# Patient Record
Sex: Male | Born: 1965 | Race: Black or African American | Hispanic: No | Smoking: Current some day smoker
Health system: Southern US, Community
[De-identification: ages and names within clinical notes are randomized; demographics above are authoritative.]

## PROBLEM LIST (undated history)

## (undated) DIAGNOSIS — Z955 Presence of coronary angioplasty implant and graft: Secondary | ICD-10-CM

## (undated) DIAGNOSIS — I739 Peripheral vascular disease, unspecified: Secondary | ICD-10-CM

## (undated) DIAGNOSIS — Z86718 Personal history of other venous thrombosis and embolism: Secondary | ICD-10-CM

## (undated) DIAGNOSIS — I82409 Acute embolism and thrombosis of unspecified deep veins of unspecified lower extremity: Secondary | ICD-10-CM

## (undated) DIAGNOSIS — E119 Type 2 diabetes mellitus without complications: Secondary | ICD-10-CM

## (undated) DIAGNOSIS — I10 Essential (primary) hypertension: Secondary | ICD-10-CM

## (undated) DIAGNOSIS — I2510 Atherosclerotic heart disease of native coronary artery without angina pectoris: Secondary | ICD-10-CM

## (undated) DIAGNOSIS — I255 Ischemic cardiomyopathy: Secondary | ICD-10-CM

## (undated) DIAGNOSIS — I219 Acute myocardial infarction, unspecified: Secondary | ICD-10-CM

## (undated) DIAGNOSIS — I420 Dilated cardiomyopathy: Secondary | ICD-10-CM

## (undated) DIAGNOSIS — I252 Old myocardial infarction: Secondary | ICD-10-CM

## (undated) DIAGNOSIS — E669 Obesity, unspecified: Secondary | ICD-10-CM

## (undated) DIAGNOSIS — E7800 Pure hypercholesterolemia, unspecified: Secondary | ICD-10-CM

## (undated) DIAGNOSIS — N529 Male erectile dysfunction, unspecified: Secondary | ICD-10-CM

## (undated) HISTORY — DX: Personal history of other venous thrombosis and embolism: Z86.718

## (undated) HISTORY — DX: Type 2 diabetes mellitus without complications: E11.9

## (undated) HISTORY — DX: Male erectile dysfunction, unspecified: N52.9

## (undated) HISTORY — DX: Presence of coronary angioplasty implant and graft: Z95.5

## (undated) HISTORY — DX: Essential (primary) hypertension: I10

## (undated) HISTORY — DX: Old myocardial infarction: I25.2

## (undated) HISTORY — DX: Dilated cardiomyopathy: I42.0

## (undated) HISTORY — DX: Pure hypercholesterolemia, unspecified: E78.00

## (undated) HISTORY — DX: Obesity, unspecified: E66.9

## (undated) HISTORY — DX: Ischemic cardiomyopathy: I25.5

## (undated) HISTORY — PX: CORONARY STENT PLACEMENT: SHX1402

---

## 1994-09-19 DIAGNOSIS — E119 Type 2 diabetes mellitus without complications: Secondary | ICD-10-CM

## 1994-09-19 HISTORY — DX: Type 2 diabetes mellitus without complications: E11.9

## 2008-12-24 ENCOUNTER — Emergency Department (HOSPITAL_BASED_OUTPATIENT_CLINIC_OR_DEPARTMENT_OTHER): Admission: EM | Admit: 2008-12-24 | Discharge: 2008-12-24 | Payer: Self-pay | Admitting: Emergency Medicine

## 2008-12-24 ENCOUNTER — Ambulatory Visit: Payer: Self-pay | Admitting: Radiology

## 2011-08-29 ENCOUNTER — Ambulatory Visit (INDEPENDENT_AMBULATORY_CARE_PROVIDER_SITE_OTHER): Payer: 59 | Admitting: Physician Assistant

## 2011-08-29 DIAGNOSIS — N529 Male erectile dysfunction, unspecified: Secondary | ICD-10-CM

## 2011-11-08 ENCOUNTER — Other Ambulatory Visit: Payer: Self-pay | Admitting: Physician Assistant

## 2011-12-26 ENCOUNTER — Ambulatory Visit (INDEPENDENT_AMBULATORY_CARE_PROVIDER_SITE_OTHER): Payer: 59 | Admitting: Physician Assistant

## 2011-12-26 ENCOUNTER — Encounter: Payer: Self-pay | Admitting: Physician Assistant

## 2011-12-26 VITALS — BP 140/90 | HR 89 | Temp 99.9°F | Resp 16 | Ht 68.75 in | Wt 176.8 lb

## 2011-12-26 DIAGNOSIS — E119 Type 2 diabetes mellitus without complications: Secondary | ICD-10-CM | POA: Insufficient documentation

## 2011-12-26 DIAGNOSIS — R5383 Other fatigue: Secondary | ICD-10-CM

## 2011-12-26 DIAGNOSIS — N50819 Testicular pain, unspecified: Secondary | ICD-10-CM

## 2011-12-26 DIAGNOSIS — N509 Disorder of male genital organs, unspecified: Secondary | ICD-10-CM

## 2011-12-26 DIAGNOSIS — I10 Essential (primary) hypertension: Secondary | ICD-10-CM

## 2011-12-26 DIAGNOSIS — R5381 Other malaise: Secondary | ICD-10-CM

## 2011-12-26 LAB — GLUCOSE, POCT (MANUAL RESULT ENTRY): POC Glucose: 261

## 2011-12-26 LAB — POCT GLYCOSYLATED HEMOGLOBIN (HGB A1C): Hemoglobin A1C: 10.6

## 2011-12-26 MED ORDER — LISINOPRIL 5 MG PO TABS: 5.0000 mg | ORAL_TABLET | Freq: Every day | ORAL | Status: DC

## 2011-12-26 MED ORDER — INSULIN GLARGINE 100 UNIT/ML ~~LOC~~ SOLN: SUBCUTANEOUS | Status: DC

## 2011-12-26 MED ORDER — METFORMIN HCL 1000 MG PO TABS: 1000.0000 mg | ORAL_TABLET | Freq: Two times a day (BID) | ORAL | Status: DC

## 2011-12-26 NOTE — Progress Notes (Signed)
Patient ID: Tommy Jimenez MRN: [id number], DOB: [birth year], 46 y.o. Date of Encounter: 12/26/2011, 8:43 PM  Primary Physician: No primary provider on file.  Chief Complaint: Follow up HTN and DM  HPI: 46 y.o. year old male with history below presents for follow up of his DM and HTN. He is doing well today. He continues to not take his Lisinopril stating that he does not believe that he needs this medication. He understands that it will protect his kidneys. He is taking his metformin 1000 mg bid and is currently using Lantus 20 units qhs. At his last office visit he was instructed to increaae his Lantus 2 units every other day from 18 units qhs until he reached an average fasting blood sugar of 150, however, he did not do this only increased by 2 units over the past 4 months. He is trying to eat healthy and exercise. His blood sugars at home he states have been in the upper 100's to mid 200's with a max of 280. He is skeptical that his sugar being that high are making him tired.   He also mentions at the end of his visit today, a couple week history of an off and on sore left testicle. No injury or trauma. He has not felt a lump or mass. No dysuria, discharge, rash, or lesion. He is monogamous with his girlfriend. He wears boxer shorts.      Past Medical History  Diagnosis Date  . DM (diabetes mellitus)   . HTN (hypertension)      Home Meds: Prior to Admission medications   Medication Sig Start Date End Date Taking? Authorizing Provider  insulin glargine (LANTUS SOLOSTAR) 100 UNIT/ML injection 22 units [place] qhs, increase by 2 units every two days until you reach a fasting blood sugar of 150 12/26/11   Rolene Andrades M Deamber Buckhalter, PA-C  lisinopril (PRINIVIL,ZESTRIL) 5 MG tablet Take 1 tablet (5 mg total) by mouth daily. 12/26/11 12/25/12  Raymon Mutton Abreanna Drawdy, PA-C  metFORMIN (GLUCOPHAGE) 1000 MG tablet Take 1 tablet (1,000 mg total) by mouth 2 (two) times daily with a meal. 12/26/11 12/25/12  Sondra Barges, PA-C    Allergies:  No Known Allergies  History   Social History  . Marital Status: Single    Spouse Name: N/A    Number of Children: N/A  . Years of Education: N/A   Occupational History  . Not on file.   Social History Main Topics  . Smoking status: Current Some Day Smoker  . Smokeless tobacco: Not on file  . Alcohol Use: Yes     socially  . Drug Use: No  . Sexually Active: Not on file   Other Topics Concern  . Not on file   Social History Narrative  . No narrative on file     Review of Systems: Constitutional: negative for chills, fever, night sweats, weight changes, or fatigue  HEENT: negative for vision changes, hearing loss, congestion, rhinorrhea, ST, epistaxis, or sinus pressure Cardiovascular: negative for chest pain or palpitations Respiratory: negative for hemoptysis, wheezing, shortness of breath, or cough Abdominal: negative for abdominal pain, nausea, vomiting, diarrhea, or constipation Dermatological: negative for rash Neurologic: negative for headache, dizziness, or syncope All other systems reviewed and are otherwise negative with the exception to those above and in the HPI.   Physical Exam: Blood pressure 140/90, pulse 89, temperature 99.9 F (37.7 C), temperature source Oral, resp. rate 16, height 5' 8.75" (1.746 m), weight 176 lb 12.8 oz (80.196 kg).,  Body mass index is 26.30 kg/(m^2). General: Well developed, well nourished, in no acute distress. Head: Normocephalic, atraumatic, eyes without discharge, sclera non-icteric, nares are without discharge. Bilateral auditory canals clear, TM's are without perforation, pearly grey and translucent with reflective cone of light bilaterally. Oral cavity moist, posterior pharynx without exudate, erythema, peritonsillar abscess, or post nasal drip.  Neck: Supple. No thyromegaly. Full ROM. No lymphadenopathy. Lungs: Clear bilaterally to auscultation without wheezes, rales, or rhonchi. Breathing is unlabored. Heart: RRR with S1 S2.  No murmurs, rubs, or gallops appreciated. Abdomen: Soft, non-tender, non-distended with normoactive bowel sounds. No hepatomegaly. No rebound/guarding. No obvious abdominal masses. GU: Circumcised penis. Testes smooth, without mass, lumps, lesions, and descended bilaterally. No hernia. No erythema, discharge, or rash.  Msk:  Strength and tone normal for age. Extremities/Skin: Warm and dry. No clubbing or cyanosis. No edema. No rashes or suspicious lesions. Neuro: Alert and oriented X 3. Moves all extremities spontaneously. Gait is normal. CNII-XII grossly in tact. Psych:  Responds to questions appropriately with a normal affect.   Labs: Results for orders placed in visit on 12/26/11  GLUCOSE, POCT (MANUAL RESULT ENTRY)      Component Value Range   POC Glucose 261    POCT GLYCOSYLATED HEMOGLOBIN (HGB A1C)      Component Value Range   Hemoglobin A1C 10.6     A1C from 12/012: 10.4% CMP pending  ASSESSMENT AND PLAN:  46 y.o. year old male with uncontrolled HTN, DM, and testicular pain. 1. DM: -Uncontrolled -Continue metformin 1000 mg #60 1 po bid RF 3 -Increase Lantus by 2 units qod until average fasting glucose at home is 150, he is starting at 20 units -Healthy diet and exercise  2. HTN -Uncontrolled -Take Lisinopril 5 mg #30 1 po daily RF 3. This is the 3rd rx I have written for him for this medication -Take BP at a local store -Healthy diet and exercise -Stop tobacoo use  3. Testicular pain -Likely due to lack of support -Stop boxers -Start briefs -If continues in 1 week call with update  4. Follow up 2 months   Signed, Eula Listen, PA-C 12/26/2011 8:43 PM

## 2011-12-27 LAB — COMPREHENSIVE METABOLIC PANEL
AST: 12 U/L (ref 0–37)
Albumin: 4.4 g/dL (ref 3.5–5.2)
Alkaline Phosphatase: 82 U/L (ref 39–117)
Potassium: 4.3 mEq/L (ref 3.5–5.3)
Sodium: 136 mEq/L (ref 135–145)
Total Bilirubin: 0.4 mg/dL (ref 0.3–1.2)
Total Protein: 7.3 g/dL (ref 6.0–8.3)

## 2011-12-27 LAB — CBC WITH DIFFERENTIAL/PLATELET
Basophils Relative: 1 % (ref 0–1)
Eosinophils Absolute: 0.2 10*3/uL (ref 0.0–0.7)
Eosinophils Relative: 2 % (ref 0–5)
MCH: 28.4 pg (ref 26.0–34.0)
MCHC: 33.7 g/dL (ref 30.0–36.0)
MCV: 84.4 fL (ref 78.0–100.0)
Monocytes Relative: 7 % (ref 3–12)
Neutrophils Relative %: 53 % (ref 43–77)
Platelets: 352 10*3/uL (ref 150–400)

## 2011-12-28 ENCOUNTER — Encounter: Payer: Self-pay | Admitting: *Deleted

## 2012-03-05 ENCOUNTER — Other Ambulatory Visit: Payer: Self-pay | Admitting: *Deleted

## 2012-04-13 ENCOUNTER — Other Ambulatory Visit: Payer: Self-pay | Admitting: Physician Assistant

## 2012-05-07 ENCOUNTER — Encounter: Payer: Self-pay | Admitting: Physician Assistant

## 2012-05-07 ENCOUNTER — Ambulatory Visit (INDEPENDENT_AMBULATORY_CARE_PROVIDER_SITE_OTHER): Payer: 59 | Admitting: Physician Assistant

## 2012-05-07 VITALS — BP 122/80 | HR 93 | Temp 98.6°F | Resp 16 | Ht 68.5 in | Wt 176.8 lb

## 2012-05-07 DIAGNOSIS — N529 Male erectile dysfunction, unspecified: Secondary | ICD-10-CM

## 2012-05-07 DIAGNOSIS — I10 Essential (primary) hypertension: Secondary | ICD-10-CM

## 2012-05-07 DIAGNOSIS — IMO0001 Reserved for inherently not codable concepts without codable children: Secondary | ICD-10-CM

## 2012-05-07 DIAGNOSIS — C8332 Diffuse large B-cell lymphoma, intrathoracic lymph nodes: Secondary | ICD-10-CM

## 2012-05-07 DIAGNOSIS — E119 Type 2 diabetes mellitus without complications: Secondary | ICD-10-CM

## 2012-05-07 LAB — GLUCOSE, POCT (MANUAL RESULT ENTRY): POC Glucose: 125 mg/dl — AB (ref 70–99)

## 2012-05-07 MED ORDER — SILDENAFIL CITRATE 50 MG PO TABS: 50.0000 mg | ORAL_TABLET | Freq: Every day | ORAL | Status: DC | PRN

## 2012-05-07 MED ORDER — LISINOPRIL 5 MG PO TABS: 5.0000 mg | ORAL_TABLET | Freq: Every day | ORAL | Status: DC

## 2012-05-07 NOTE — Progress Notes (Signed)
Patient ID: Tommy Jimenez MRN: [id number], DOB: 01/16/1966, 46 y.o. Date of Encounter: 05/07/2012, 3:34 PM  Primary Physician: No primary provider on file.  Chief Complaint: HTN  HPI: 46 y.o. year old male with history below presents for hypertension and diabetes follow up. Doing well. No issues or complaints. Stopped checking his blood sugars at home. Believes that his last readings were in the low 200's. Currently taking 30 units of Lantus each night. Has not increased this in quite sometime. Trying to eat healthy. Still with some fatigue. Would like to have his testosterone checked today. Also requests a refill of Viagra. Taking medications daily without issues.   No CP, HA, visual changes, or focal deficits.   Past Medical History  Diagnosis Date  . DM (diabetes mellitus)   . HTN (hypertension)      Home Meds: Prior to Admission medications   Medication Sig Start Date End Date Taking? Authorizing Provider  insulin glargine (LANTUS SOLOSTAR) 100 UNIT/ML injection 22 units [place] qhs, increase by 2 units every two days until you reach a fasting blood sugar of 150 12/26/11   Adream Parzych M Ellanora Rayborn, PA-C  lisinopril (PRINIVIL,ZESTRIL) 5 MG tablet Take 1 tablet (5 mg total) by mouth daily. 12/26/11 12/25/12  Raymon Mutton Leeam Cedrone, PA-C  metFORMIN (GLUCOPHAGE) 1000 MG tablet Take 1 tablet (1,000 mg total) by mouth 2 (two) times daily with a meal. 12/26/11 12/25/12  Sondra Barges, PA-C    Allergies: No Known Allergies  History   Social History  . Marital Status: Single    Spouse Name: N/A    Number of Children: N/A  . Years of Education: N/A   Occupational History  . Not on file.   Social History Main Topics  . Smoking status: Current Some Day Smoker -- 20 years    Types: Cigarettes  . Smokeless tobacco: Not on file  . Alcohol Use: Yes     socially - beer  . Drug Use: No  . Sexually Active: Not on file   Other Topics Concern  . Not on file   Social History Narrative  . No narrative on file     No  family history on file.  Review of Systems: Constitutional: negative for chills, fever, night sweats, weight changes, or fatigue  HEENT: negative for vision changes, hearing loss, congestion, rhinorrhea, ST, epistaxis, or sinus pressure Cardiovascular: negative for chest pain, palpitations, or DOE Respiratory: negative for hemoptysis, wheezing, shortness of breath, or cough Abdominal: negative for abdominal pain, nausea, vomiting, diarrhea, or constipation Dermatological: negative for rash Neurologic: negative for headache, dizziness, or syncope All other systems reviewed and are otherwise negative with the exception to those above and in the HPI.   Physical Exam: Blood pressure 122/80, pulse 93, temperature 98.6 F (37 C), temperature source Oral, resp. rate 16, height 5' 8.5" (1.74 m), weight 176 lb 12.8 oz (80.196 kg), SpO2 99.00%., Body mass index is 26.49 kg/(m^2). General: Well developed, well nourished, in no acute distress. Head: Normocephalic, atraumatic, eyes without discharge, sclera non-icteric, nares are without discharge. Bilateral auditory canals clear, TM's are without perforation, pearly grey and translucent with reflective cone of light bilaterally. Oral cavity moist, posterior pharynx without exudate, erythema, peritonsillar abscess, or post nasal drip.  Neck: Supple. No thyromegaly. Full ROM. No lymphadenopathy. No carotid bruits. Lungs: Clear bilaterally to auscultation without wheezes, rales, or rhonchi. Breathing is unlabored. Heart: RRR with S1 S2. No murmurs, rubs, or gallops appreciated.  Abdomen: Soft, non-tender, non-distended with normoactive bowel  sounds. No hepatosplenomegaly. No rebound/guarding. No obvious abdominal masses. Msk:  Strength and tone normal for age. Extremities/Skin: Warm and dry. No clubbing or cyanosis. No edema. No rashes or suspicious lesions. Distal pulses 2+ and equal bilaterally. Neuro: Alert and oriented X 3. Moves all extremities  spontaneously. Gait is normal. CNII-XII grossly in tact. DTR 2+, cerebellar function intact. [place] normal. Psych:  Responds to questions appropriately with a normal affect.   Labs: Results for orders placed in visit on 05/07/12  GLUCOSE, POCT (MANUAL RESULT ENTRY)      Component Value Range   POC Glucose 125 (*) 70 - 99 mg/dl  POCT GLYCOSYLATED HEMOGLOBIN (HGB A1C)      Component Value Range   Hemoglobin A1C 10.4     A1C on 12/26/11 was 10.6  BMP pending  ASSESSMENT AND PLAN:  46 y.o. year old male with IDDM, HTN, and ED. 1. IDDM -Increase Lantus by 2 units every two days until FBS 130, call when needs refills -Continue Metformin 1000 mg bid, call when needs refills -Continue Lisinopril 5 mg, refilled x 3 -Healthy diet and exercise  2. HTN -Continue Lisinopril 5 mg daily, refilled x 3 -Healthy diet and exercise  3. ED -Check testosterone -Viagra 50 mg As directed #10 RF 8  Signed, Eula Listen, PA-C 05/07/2012 3:34 PM

## 2012-05-08 LAB — BASIC METABOLIC PANEL
BUN: 12 mg/dL (ref 6–23)
Chloride: 107 mEq/L (ref 96–112)
Creat: 0.92 mg/dL (ref 0.50–1.35)
Glucose, Bld: 118 mg/dL — ABNORMAL HIGH (ref 70–99)
Potassium: 4 mEq/L (ref 3.5–5.3)

## 2012-05-09 ENCOUNTER — Telehealth: Payer: Self-pay

## 2012-05-09 NOTE — Telephone Encounter (Signed)
Left message for call back Tommy Jimenez has advised on lab results insurance not likely to cover the Testosterone b/c borderline level, was not low.

## 2012-05-09 NOTE — Telephone Encounter (Signed)
AYESHEA STATES PT GOT THE MEDICINE FOR VIAGRA BUT WAS ALSO TO GET A [facility] MEDICINE ALSO PLEASE CALL [phone]

## 2012-05-12 NOTE — Telephone Encounter (Signed)
Pt notified of labs

## 2012-05-15 ENCOUNTER — Telehealth: Payer: Self-pay | Admitting: Radiology

## 2012-05-15 NOTE — Telephone Encounter (Signed)
Patient has called back and was advised of labs and need to repeat these, he was advised not candidate for Testosterone treatment at this time, level should be below 300 for treatment.

## 2012-10-08 ENCOUNTER — Other Ambulatory Visit: Payer: Self-pay | Admitting: Physician Assistant

## 2013-03-07 ENCOUNTER — Other Ambulatory Visit: Payer: Self-pay | Admitting: Physician Assistant

## 2013-07-26 ENCOUNTER — Ambulatory Visit (INDEPENDENT_AMBULATORY_CARE_PROVIDER_SITE_OTHER): Payer: 59 | Admitting: Medical

## 2013-07-26 ENCOUNTER — Encounter: Payer: Self-pay | Admitting: Medical

## 2013-07-26 ENCOUNTER — Telehealth: Payer: Self-pay | Admitting: Medical

## 2013-07-26 VITALS — BP 140/90 | HR 80 | Temp 97.9°F | Resp 16 | Ht 69.5 in | Wt 170.0 lb

## 2013-07-26 DIAGNOSIS — R202 Paresthesia of skin: Secondary | ICD-10-CM

## 2013-07-26 DIAGNOSIS — F17200 Nicotine dependence, unspecified, uncomplicated: Secondary | ICD-10-CM

## 2013-07-26 DIAGNOSIS — R51 Headache: Secondary | ICD-10-CM

## 2013-07-26 DIAGNOSIS — R209 Unspecified disturbances of skin sensation: Secondary | ICD-10-CM

## 2013-07-26 DIAGNOSIS — I10 Essential (primary) hypertension: Secondary | ICD-10-CM

## 2013-07-26 DIAGNOSIS — H9319 Tinnitus, unspecified ear: Secondary | ICD-10-CM

## 2013-07-26 DIAGNOSIS — H9312 Tinnitus, left ear: Secondary | ICD-10-CM

## 2013-07-26 DIAGNOSIS — R42 Dizziness and giddiness: Secondary | ICD-10-CM

## 2013-07-26 LAB — CBC WITH DIFFERENTIAL/PLATELET
Eosinophils Absolute: 0.3 10*3/uL (ref 0.0–0.7)
Eosinophils Relative: 3 % (ref 0–5)
HCT: 44.2 % (ref 39.0–52.0)
Hemoglobin: 14.9 g/dL (ref 13.0–17.0)
Lymphocytes Relative: 29 % (ref 12–46)
Lymphs Abs: 3 10*3/uL (ref 0.7–4.0)
MCH: 28.3 pg (ref 26.0–34.0)
MCV: 84 fL (ref 78.0–100.0)
Monocytes Absolute: 0.8 10*3/uL (ref 0.1–1.0)
Monocytes Relative: 8 % (ref 3–12)
RBC: 5.26 MIL/uL (ref 4.22–5.81)
WBC: 10.4 10*3/uL (ref 4.0–10.5)

## 2013-07-26 LAB — COMPREHENSIVE METABOLIC PANEL
Alkaline Phosphatase: 89 U/L (ref 39–117)
CO2: 26 mEq/L (ref 19–32)
Creat: 0.93 mg/dL (ref 0.50–1.35)
Glucose, Bld: 223 mg/dL — ABNORMAL HIGH (ref 70–99)
Sodium: 136 mEq/L (ref 135–145)
Total Bilirubin: 0.6 mg/dL (ref 0.3–1.2)
Total Protein: 7.2 g/dL (ref 6.0–8.3)

## 2013-07-26 LAB — LIPID PANEL
HDL: 45 mg/dL (ref >39)
Total CHOL/HDL Ratio: 5.3 Ratio
VLDL: 15 mg/dL (ref 0–40)

## 2013-07-26 LAB — HEMOGLOBIN A1C
Hgb A1c MFr Bld: 12.5 % — ABNORMAL HIGH (ref <5.7)
Mean Plasma Glucose: 312 mg/dL — ABNORMAL HIGH (ref <117)

## 2013-07-26 MED ORDER — TADALAFIL 5 MG PO TABS: 5.0000 mg | ORAL_TABLET | Freq: Every day | ORAL | Status: DC | PRN

## 2013-07-26 MED ORDER — METFORMIN HCL 1000 MG PO TABS: 1000.0000 mg | ORAL_TABLET | Freq: Two times a day (BID) | ORAL | Status: DC

## 2013-07-26 MED ORDER — INSULIN GLARGINE 100 UNIT/ML ~~LOC~~ SOLN: 30.0000 [IU] | Freq: Every day | SUBCUTANEOUS | Status: DC

## 2013-07-26 MED ORDER — LISINOPRIL 10 MG PO TABS: 10.0000 mg | ORAL_TABLET | Freq: Every day | ORAL | Status: DC

## 2013-07-26 NOTE — Telephone Encounter (Signed)
Order MRI brain

## 2013-07-26 NOTE — Progress Notes (Signed)
Subjective:  Tommy Jimenez is a 47 y.o. male who presents as a new patient today.  He has a hx/o significant for diabetes type II and HTN.  He is a smoker.    He reports 3 year hx/o awakening in the morning with numbness in one or both hands, last for about 12 hours then resolves.  Within last 2 wk, awakening with worse numbness in first 3 fingers of left hand and corner of mouth on the left.   1st and 3rd finger seems to let up as the day goes on, but the tip of the 2nd finger has remained numb in general.  He reports left sided headache, daily.  Sometimes gets dizzy when he first gets out of bed.  Sometimes gets ringing in left ear.   Ringing and dizziness resolves after morning coffee.   He notes migraines for the last year, but for the last 37mo gets left sided headaches every 2 weeks.  No aura. Does get photophobia and phonophobia.  Headaches have been worsening.  Denies weakness, shooting pains, denies vision changes, hearing loss, nausea, vomiting, fever, memory or speech changes, abdominal discomfort, no NVD or constipation.   Saw another provider about the numbness prior, had EKG, and was put on his current medications for BP/diabetes.  Sleep is ok except he doesn't feel rested when he gets up.  His older brother passed away from brain tumor, so he worried about this.  No other aggravating or relieving factors.    DM type 2 - doesn't take his metformin and lantus every day.  someday if he doesn't eat as much or if not feeling well, won't use his medication.  Doesn't check glucose every day.  He is compliant with his BP medication, but not checking glucose.    No other c/o.  The following portions of the patient's history were reviewed and updated as appropriate: allergies, current medications, past family history, past medical history, past social history, past surgical history and problem list.  ROS Otherwise as in subjective above  Objective: Physical Exam  BP 140/90  Pulse 80   Temp(Src) 97.9 F (36.6 C) (Oral)  Resp 16  Ht 5' 9.5" (1.765 m)  Wt 170 lb (77.111 kg)  BMI 24.75 kg/m2   General appearance: alert, no distress, WD/WN, AA male HEENT: normocephalic, sclerae anicteric, conjunctiva pink and moist, TMs pearly, nares patent, no discharge or erythema, pharynx normal Oral cavity: MMM, no lesions Neck: supple, no lymphadenopathy, no thyromegaly, no masses, no bruits Heart: RRR, normal S1, S2, no murmurs Lungs: CTA bilaterally, no wheezes, rhonchi, or rales Abdomen: +bs, soft, non tender, non distended, no masses, no hepatomegaly, no splenomegaly Pulses: 2+ radial pulses, 2+ pedal pulses, normal cap refill Ext: no edema, cyanosis or clubbing Neuro: CN2-12 intact, +tinel's, otherwise nonfocal exam   Adult ECG Report  Indication: paresthesias  Rate: 72bpm  Rhythm: normal sinus rhythm  QRS Axis: 51 degrees  PR Interval:  QRS Duration: 88ms  QTc:  Conduction Disturbances: none  Other Abnormalities: none  Patient's cardiac risk factors are: diabetes mellitus, hypertension, male gender, sedentary lifestyle and smoking/ tobacco exposure.  EKG comparison: none  Narrative Interpretation: normal EKG     Assessment: Encounter Diagnoses  Name Primary?  . Type II or unspecified type diabetes mellitus without mention of complication, uncontrolled Yes  . Essential hypertension, benign   . Paresthesia   . Worsening headaches   . Dizziness   . Tinnitus, left   . Tobacco use disorder  Plan: DM type II - per prior records, not well controlled.  Labs today.   HTN - increase to Lisinopril 10mg  daily. Paresthesias, worsening headaches, dizziness, tinnitus - discussed his symptoms that could suggest several etiologies.  Proceed with labs, MRI brain. Tobacco use - discussed risks, advised he work on quitting or consider quitting. Follow up: pending labs, brain MRI

## 2013-07-28 ENCOUNTER — Other Ambulatory Visit: Payer: Self-pay | Admitting: Medical

## 2013-07-28 MED ORDER — INSULIN GLARGINE 100 UNIT/ML ~~LOC~~ SOLN: 40.0000 [IU] | Freq: Every day | SUBCUTANEOUS | Status: DC

## 2013-07-28 MED ORDER — METFORMIN HCL 1000 MG PO TABS: 1000.0000 mg | ORAL_TABLET | Freq: Two times a day (BID) | ORAL | Status: DC

## 2013-07-28 MED ORDER — ATORVASTATIN CALCIUM 40 MG PO TABS: 40.0000 mg | ORAL_TABLET | Freq: Every day | ORAL | Status: DC

## 2013-07-29 NOTE — Telephone Encounter (Signed)
Patient is aware of his appointment to have his MRI on 08/02/13 @ 730 am at [facility]. CLS

## 2013-08-01 ENCOUNTER — Telehealth: Payer: Self-pay | Admitting: Family Medicine

## 2013-08-01 NOTE — Telephone Encounter (Signed)
Chip Boer with [facility] [phone] called and states pt has appt at 7:30 a.m. For Brain MRI without contrast tomorrow.  Chip Boer needs prior approval before tomorrow.  Called [place] at home and this has not been done.  Martie Lee please obtain approval.

## 2013-08-01 NOTE — Telephone Encounter (Signed)
Pt is approved for 45 days exp 09/15/13. AUTH # [id number]

## 2013-08-02 ENCOUNTER — Ambulatory Visit
Admission: RE | Admit: 2013-08-02 | Discharge: 2013-08-02 | Disposition: A | Discharge status: Home / Self Care | Payer: 59 | Source: Ambulatory Visit | Attending: Medical | Admitting: Medical

## 2013-08-02 DIAGNOSIS — R42 Dizziness and giddiness: Secondary | ICD-10-CM

## 2013-08-02 DIAGNOSIS — H9312 Tinnitus, left ear: Secondary | ICD-10-CM

## 2013-08-02 DIAGNOSIS — R202 Paresthesia of skin: Secondary | ICD-10-CM

## 2013-08-05 ENCOUNTER — Ambulatory Visit (INDEPENDENT_AMBULATORY_CARE_PROVIDER_SITE_OTHER): Payer: 59 | Admitting: Medical

## 2013-08-05 VITALS — BP 150/82 | HR 100 | Temp 98.2°F | Resp 16 | Wt 170.0 lb

## 2013-08-05 DIAGNOSIS — I10 Essential (primary) hypertension: Secondary | ICD-10-CM

## 2013-08-05 DIAGNOSIS — E785 Hyperlipidemia, unspecified: Secondary | ICD-10-CM

## 2013-08-05 DIAGNOSIS — R930 Abnormal findings on diagnostic imaging of skull and head, not elsewhere classified: Secondary | ICD-10-CM

## 2013-08-05 DIAGNOSIS — R9089 Other abnormal findings on diagnostic imaging of central nervous system: Secondary | ICD-10-CM

## 2013-08-05 DIAGNOSIS — R202 Paresthesia of skin: Secondary | ICD-10-CM

## 2013-08-05 DIAGNOSIS — J320 Chronic maxillary sinusitis: Secondary | ICD-10-CM

## 2013-08-05 DIAGNOSIS — R42 Dizziness and giddiness: Secondary | ICD-10-CM

## 2013-08-05 DIAGNOSIS — R209 Unspecified disturbances of skin sensation: Secondary | ICD-10-CM

## 2013-08-05 MED ORDER — MECLIZINE HCL 25 MG PO TABS: 25.0000 mg | ORAL_TABLET | Freq: Three times a day (TID) | ORAL | Status: DC | PRN

## 2013-08-05 MED ORDER — SAXAGLIPTIN-METFORMIN ER 5-1000 MG PO TB24: 1.0000 | ORAL_TABLET | Freq: Every day | ORAL | Status: DC

## 2013-08-05 MED ORDER — AMOXICILLIN 875 MG PO TABS: 875.0000 mg | ORAL_TABLET | Freq: Two times a day (BID) | ORAL | Status: DC

## 2013-08-05 NOTE — Patient Instructions (Signed)
Dizziness:  Begin Meclizine 1 tablet 2-3 times daily for dizziness short term.  If the dizziness resolves in 1-2 weeks, then stop the medication or use as needed.  You can take 1/2-1 tablet of the Meclizine if it makes you a little sleepy  Begin Amoxicillin antibiotic for sinus infection seen on MRI  Using Saline Nose Drops with Bulb Syringe A bulb syringe is used to clear your nose. You may use it when you have a stuffy nose, nasal congestion, sinus pressure, or sneezing.   SALINE SOLUTION You can buy nose drops at your local drug store. You can also make nose drops yourself. Mix 1 cup of water with  teaspoon of salt. Stir. Store this mixture at room temperature. Make a new batch daily.  USE THE BULB IN COMBINATION WITH SALINE NOSE DROPS  Squeeze the air out of the bulb before suctioning the saline mixture.  While still squeezing the bulb flat, place the tip of the bulb into the saline mixture.  Let air come back into the bulb.  This will suction up the saline mixture.  Gently flush one nostril at a time.  Salt water nose drops will then moisten your  congested nose and loosen secretions before suctioning.  Use the bulb syringe as directed below to suction.  USING THE BULB SYRINGE TO SUCTION  While still squeezing the bulb flat, place the tip of the bulb into a nostril. Let air come back into the bulb. The suction will pull snot out of the nose and into the bulb.  Repeat on the other nostril.  Squeeze syringe several times into a tissue.  CLEANING THE BULB SYRINGE  Clean the bulb syringe every day with hot soapy water.  Clean the inside of the bulb by squeezing the bulb while the tip is in soapy water.  Rinse by squeezing the bulb while the tip is in clean hot water.  Store the bulb with the tip side down on paper towel.  HOME CARE INSTRUCTIONS   Use saline nose drops often to keep the nose open and not stuffy.  Throw away used salt water. Make a new solution every  time.  Do not use the same solution and dropper for another person  If you do not prefer to use nasal saline flush, other options include nasal saline spray or the [employer], both of which are available over the counter at your pharmacy.   Diabetes Meal Planning Guide The diabetes meal planning guide is a tool to help you plan your meals and snacks. It is important for people with diabetes to manage their blood glucose (sugar) levels. Choosing the right foods and the right amounts throughout your day will help control your blood glucose. Eating right can even help you improve your blood pressure and reach or maintain a healthy weight.  CARBOHYDRATE COUNTING MADE EASY When you eat carbohydrates, they turn to sugar. This raises your blood glucose level. Counting carbohydrates can help you control this level so you feel better. When you plan your meals by counting carbohydrates, you can have more flexibility in what you eat and balance your medicine with your food intake.  Carbohydrate counting simply means adding up the total amount of carbohydrate grams in your meals and snacks. Try to eat about the same amount at each meal. Foods with carbohydrates are listed below.   Each portion below is 1 carbohydrate serving (ie, 1 CARB = 15 grams of carbohydrates on the food label.    1800  Calorie Diet for Diabetes Meal Planning The 1800 calorie diet is designed for eating up to 1800 calories each day. Following this diet and making healthy meal choices can help improve overall health. This diet controls blood sugar (glucose) levels and can also help lower blood pressure and cholesterol.  SERVING SIZES Measuring foods and serving sizes helps to make sure you are getting the right amount of food. The list below tells how big or small some common serving sizes are:  1 oz.........4 stacked dice.  3 oz........Marland KitchenDeck of cards.  1 tsp.......Marland KitchenTip of little finger.  1 tbs......Marland KitchenMarland KitchenThumb.  2 tbs.......Marland KitchenGolf  ball.   cup......Marland KitchenHalf of a fist.  1 cup.......Marland KitchenA fist.   GUIDELINES FOR CHOOSING FOODS The goal of this diet is to eat a variety of foods and limit calories to 1800 each day. This can be done by choosing foods that are low in calories and fat. The diet also suggests eating small amounts of food frequently. Doing this helps control your blood glucose levels so they do not get too high or too low. Each meal or snack may include a protein food source to help you feel more satisfied and to stabilize your blood glucose. Try to eat about the same amount of food around the same time each day. This includes weekend days, travel days, and days off work. Space your meals about 4 to 5 hours apart and add a snack between them if you wish.  For example, a daily food plan could include breakfast, a morning snack, lunch, dinner, and an evening snack. Healthy meals and snacks include whole grains, vegetables, fruits, lean meats, poultry, fish, and dairy products. As you plan your meals, select a variety of foods. Choose from the bread and starch, vegetable, fruit, dairy, and meat/protein groups. Examples of foods from each group and their suggested serving sizes are listed below. Use measuring cups and spoons to become familiar with what a healthy portion looks like. Bread and Starch Each serving equals 15 grams of carbohydrates.  1 slice bread.   bagel.   cup cold cereal (unsweetened).   cup hot cereal or mashed potatoes.  1 small potato (size of a computer mouse).   cup cooked pasta or rice.   English muffin.  1 cup broth-based soup.  3 cups of popcorn.  4 to 6 whole-wheat crackers.   cup cooked beans, peas, or corn. Vegetable Each serving equals 5 grams of carbohydrates.   cup cooked vegetables.  1 cup raw vegetables.   cup tomato or vegetable juice. Fruit Each serving equals 15 grams of carbohydrates.  1 small apple or orange.  1 cup watermelon or strawberries.   cup  applesauce (no sugar added).  2 tbs raisins.   banana.   cup canned fruit, packed in water, its own juice, or sweetened with a sugar substitute.   cup unsweetened fruit juice. Dairy Each serving equals 12 to 15 grams of carbohydrates.  1 cup fat-free milk.  6 oz artificially sweetened yogurt or plain yogurt.  1 cup low-fat buttermilk.  1 cup soy milk.  1 cup almond milk. Meat/Protein  1 large egg.  2 to 3 oz meat, poultry, or fish.   cup low-fat cottage cheese.  1 tbs peanut butter.  1 oz low-fat cheese.   cup tuna in water.   cup tofu. Fat  1 tsp oil.  1 tsp trans-fat-free margarine.  1 tsp butter.  1 tsp mayonnaise.  2 tbs avocado.  1 tbs salad dressing.  1  tbs cream cheese.  2 tbs sour cream.  SAMPLE 1800 CALORIE DIET PLAN Breakfast   cup unsweetened cereal (1 carb serving).  1 cup fat-free milk (1 carb serving).  1 slice whole-wheat toast (1 carb serving).   small banana (1 carb serving).  1 scrambled egg.  1 tsp trans-fat-free margarine. Lunch  Tuna sandwich.  2 slices whole-wheat bread (2 carb servings).   cup canned tuna in water, drained.  1 tbs reduced fat mayonnaise.  1 stalk celery, chopped.  2 slices tomato.  1 lettuce leaf.  1 cup carrot sticks.  24 to 30 seedless grapes (2 carb servings).  6 oz light yogurt (1 carb serving). Afternoon Snack  3 graham cracker squares (1 carb serving).  Fat-free milk, 1 cup (1 carb serving).  1 tbs peanut butter. Dinner  3 oz salmon, broiled with 1 tsp oil.  1 cup mashed potatoes (2 carb servings) with 1 tsp trans-fat-free margarine.  1 cup fresh or frozen green beans.  1 cup steamed asparagus.  1 cup fat-free milk (1 carb serving). Evening Snack  3 cups air-popped popcorn (1 carb serving).  2 tbs parmesan cheese sprinkled on top.  MEAL PLAN Use this worksheet to help you make a daily meal plan based on the 1800 calorie diet suggestions. If you are  using this plan to help you control your blood glucose, you may interchange carbohydrate-containing foods (dairy, starches, and fruits). Select a variety of fresh foods of varying colors and flavors. The total amount of carbohydrate in your meals or snacks is more important than making sure you include all of the food groups every time you eat. Choose from the following foods to build your day's meals:  8 Starches.  4 Vegetables.  3 Fruits.  2 Dairy.  6 to 7 oz Meat/Protein.  Up to 4 Fats.  AVOID: Soda  Sweet tea Cookies Candy Fast and fried foods   Your dietician can use this worksheet to help you decide how many servings and which types of foods are right for you. BREAKFAST Food Group and Servings / Food Choice Starch ________________________________________________________ Dairy _________________________________________________________ Fruit _________________________________________________________ Meat/Protein __________________________________________________ Fat ___________________________________________________________ LUNCH Food Group and Servings / Food Choice Starch ________________________________________________________ Meat/Protein __________________________________________________ Vegetable _____________________________________________________ Fruit _________________________________________________________ Dairy _________________________________________________________ Fat ___________________________________________________________ Tommy Jimenez Food Group and Servings / Food Choice Starch ________________________________________________________ Meat/Protein __________________________________________________ Fruit __________________________________________________________ Dairy _________________________________________________________ Tommy Jimenez Food Group and Servings / Food Choice Starch  _________________________________________________________ Meat/Protein ___________________________________________________ Dairy __________________________________________________________ Vegetable ______________________________________________________ Fruit ___________________________________________________________ Fat ____________________________________________________________ Tommy Jimenez Food Group and Servings / Food Choice Fruit __________________________________________________________ Meat/Protein ___________________________________________________ Dairy __________________________________________________________ Starch _________________________________________________________ DAILY TOTALS Starch ____________________________ Vegetable _________________________ Fruit _____________________________ Dairy _____________________________ Meat/Protein______________________ Fat _______________________________ Document Released: 03/28/2005 Document Revised: 11/28/2011 Document Reviewed: 07/22/2011 [employer] Patient Information 2013 [place], [place].

## 2013-08-05 NOTE — Progress Notes (Signed)
Subjective:  Tommy Jimenez is a 47 y.o. male who presents for f/u from recent visit.  He has a hx/o significant for diabetes type II and HTN.  He is a smoker.    He is here for recheck on the MRI of brain. He reports 3 year hx/o awakening in the morning with numbness in one or both hands, last for about 12 hours then resolves.  Within last 2 wk, awakening with worse numbness in first 3 fingers of left hand and corner of mouth on the left.   1st and 3rd finger seems to let up as the day goes on, but the tip of the 2nd finger has remained numb in general.  He reports left sided headache, daily.  Sometimes gets dizzy when he first gets out of bed.  Sometimes gets ringing in left ear.   Ringing and dizziness resolves after morning coffee.   He notes migraines for the last year, but for the last 75mo gets left sided headaches every 2 weeks.  No aura. Does get photophobia and phonophobia.  Headaches have been worsening.  Denies weakness, shooting pains, denies vision changes, hearing loss, nausea, vomiting, fever, memory or speech changes, abdominal discomfort, no NVD or constipation.   Saw another provider about the numbness prior, had EKG, and was put on his current medications for BP/diabetes.  Sleep is ok except he doesn't feel rested when he gets up.  His older brother passed away from brain tumor, so he worried about this.  No other aggravating or relieving factors.    DM type 2 - doesn't take his metformin and lantus every day.  someday if he doesn't eat as much or if not feeling well, won't use his medication.  Doesn't check glucose every day. Since last visit he has increased to Lantus 35 units at nighttime. He is checking his sugars on average twice a day, still seeing 200s.  He did start the Lipitor for high cholesterol. He has been on statin before without problem.  He is compliant with his BP medication and we increased to Lisinopril 10mg  last visit.   No other c/o.  The following portions of  the patient's history were reviewed and updated as appropriate: allergies, current medications, past family history, past medical history, past social history, past surgical history and problem list.  ROS Otherwise as in subjective above  Objective: Physical Exam  BP 150/82  Pulse 100  Temp(Src) 98.2 F (36.8 C) (Oral)  Resp 16  Wt 170 lb (77.111 kg)   General appearance: alert, no distress, WD/WN, AA male   Assessment: Encounter Diagnoses  Name Primary?  . Type II or unspecified type diabetes mellitus without mention of complication, uncontrolled Yes  . Abnormal brain MRI   . Maxillary sinusitis   . Dizziness and giddiness   . Paresthesia   . Hyperlipidemia     Plan: DM type II - increased his Lantus to 35 units nightly for one week, then he will increase to 40 units at nighttime.  Stop metformin.  Begin Kombiglyze 2.5/1000mg  daily samples for 2 weeks, then increase to 5/1000mg  daily.  Counseled on diet, carb counting, changes he needs to make.   Abnormal MRI of brain-there was finding of a sinus infection, and relation to his symptoms we'll treat with amoxicillin for 10 days.  Gave meclizine for when necessary use.  Given the unusual adenoid tissue referred to on the MRI report, we will check an HIV and syphilis blood test.  Followup pending labs  HTN - continue Lisinopril 10mg  daily.  Hyperlipidemia - he has begun Lipitor

## 2013-08-06 LAB — HIV ANTIBODY (ROUTINE TESTING W REFLEX): HIV: NONREACTIVE

## 2013-08-12 ENCOUNTER — Ambulatory Visit: Payer: Self-pay | Admitting: Medical

## 2013-11-20 ENCOUNTER — Other Ambulatory Visit: Payer: Self-pay | Admitting: Family Medicine

## 2013-11-20 ENCOUNTER — Telehealth: Payer: Self-pay | Admitting: Medical

## 2013-11-20 ENCOUNTER — Telehealth: Payer: Self-pay | Admitting: Family Medicine

## 2013-11-20 MED ORDER — INSULIN GLARGINE 100 UNIT/ML SOLOSTAR PEN: 0.4000 [IU] | PEN_INJECTOR | Freq: Every day | SUBCUTANEOUS | Status: DC

## 2013-11-20 MED ORDER — INSULIN GLARGINE 100 UNIT/ML ~~LOC~~ SOLN: 40.0000 [IU] | Freq: Every day | SUBCUTANEOUS | Status: DC

## 2013-11-20 NOTE — Telephone Encounter (Signed)
Made a correction on his medication list per the wife. CLS

## 2013-11-20 NOTE — Telephone Encounter (Signed)
RX REFILL SENT . CLS 

## 2013-11-29 ENCOUNTER — Ambulatory Visit (INDEPENDENT_AMBULATORY_CARE_PROVIDER_SITE_OTHER): Payer: 59 | Admitting: Medical

## 2013-11-29 ENCOUNTER — Encounter: Payer: Self-pay | Admitting: Medical

## 2013-11-29 VITALS — BP 130/80 | HR 72 | Temp 98.0°F | Resp 14 | Wt 166.0 lb

## 2013-11-29 DIAGNOSIS — N529 Male erectile dysfunction, unspecified: Secondary | ICD-10-CM | POA: Insufficient documentation

## 2013-11-29 DIAGNOSIS — N521 Erectile dysfunction due to diseases classified elsewhere: Secondary | ICD-10-CM | POA: Insufficient documentation

## 2013-11-29 DIAGNOSIS — E119 Type 2 diabetes mellitus without complications: Secondary | ICD-10-CM | POA: Insufficient documentation

## 2013-11-29 DIAGNOSIS — I10 Essential (primary) hypertension: Secondary | ICD-10-CM | POA: Insufficient documentation

## 2013-11-29 DIAGNOSIS — E785 Hyperlipidemia, unspecified: Secondary | ICD-10-CM | POA: Insufficient documentation

## 2013-11-29 DIAGNOSIS — E1165 Type 2 diabetes mellitus with hyperglycemia: Principal | ICD-10-CM

## 2013-11-29 DIAGNOSIS — IMO0001 Reserved for inherently not codable concepts without codable children: Secondary | ICD-10-CM

## 2013-11-29 DIAGNOSIS — Z794 Long term (current) use of insulin: Secondary | ICD-10-CM | POA: Insufficient documentation

## 2013-11-29 LAB — ALT: ALT: 11 U/L (ref 0–53)

## 2013-11-29 LAB — POCT GLYCOSYLATED HEMOGLOBIN (HGB A1C): Hemoglobin A1C: 10

## 2013-11-29 LAB — LIPID PANEL
CHOL/HDL RATIO: 4.3 ratio
CHOLESTEROL: 203 mg/dL — AB (ref 0–200)
HDL: 47 mg/dL (ref >39)
LDL Cholesterol: 138 mg/dL — ABNORMAL HIGH (ref 0–99)
Triglycerides: 89 mg/dL (ref <150)
VLDL: 18 mg/dL (ref 0–40)

## 2013-11-29 MED ORDER — TADALAFIL 10 MG PO TABS: ORAL_TABLET | ORAL | Status: DC

## 2013-11-29 MED ORDER — LISINOPRIL 10 MG PO TABS: 10.0000 mg | ORAL_TABLET | Freq: Every day | ORAL | Status: DC

## 2013-11-29 MED ORDER — SAXAGLIPTIN-METFORMIN ER 5-1000 MG PO TB24: 1.0000 | ORAL_TABLET | Freq: Every day | ORAL | Status: DC

## 2013-11-29 MED ORDER — ATORVASTATIN CALCIUM 40 MG PO TABS: 40.0000 mg | ORAL_TABLET | Freq: Every day | ORAL | Status: DC

## 2013-11-29 MED ORDER — INSULIN GLARGINE 100 UNIT/ML SOLOSTAR PEN: 0.4000 [IU] | PEN_INJECTOR | Freq: Every day | SUBCUTANEOUS | Status: DC

## 2013-11-29 MED ORDER — DULAGLUTIDE 1.5 MG/0.5ML ~~LOC~~ SOAJ: 1.5000 mg | SUBCUTANEOUS | Status: DC

## 2013-11-29 NOTE — Progress Notes (Signed)
  Subjective:    Tommy Jimenez is a 48 y.o. male who presents for follow-up of  Chief Complaint  Patient presents with  . fasting medication check    on BP, Diabeties and cholosterol medications.   Hyperlipidemia At his last visit his labs showed abnormal elevated LDL, and we started him on Lipitor which he is taking every day without complaint  Erectile dysfunction Did well with trial of Cialis, would like to continue this  Essential hypertension, benign Compliant with medication without complaint  Type II or unspecified type diabetes mellitus without mention of complication, uncontrolled  Home blood sugar records: running 150 , 160 doesn't check all the time  Current symptoms/problems include increase urination and have  been stable. Daily foot checks: not daily but does look at them   Any foot concerns: none Last eye exam:  2 years ago   Medication compliance: Current diet: in general, a "healthy" diet   Current exercise: none Known diabetic complications: none Cardiovascular risk factors: diabetes mellitus, dyslipidemia, hypertension and male gender     The following portions of the patient's history were reviewed and updated as appropriate: allergies, current medications, past family history, past medical history, past social history, past surgical history and problem list.  ROS as in subjective above    Objective:    Pulse 72  Temp(Src) 98 F (36.7 C) (Oral)  Resp 14  Wt 166 lb (75.297 kg)   General appearance: alert, no distress, WD/WN Neck: supple, no lymphadenopathy, no thyromegaly, no masses Heart: RRR, normal S1, S2, no murmurs Lungs: CTA bilaterally, no wheezes, rhonchi, or rales Abdomen: +bs, soft, non tender, non distended, no masses, no hepatomegaly, no splenomegaly Pulses: 2+ symmetric, upper and lower extremities, normal cap refill Ext: no edema    Assessment:   Encounter Diagnoses  Name Primary?  . Type II or unspecified type diabetes  mellitus without mention of complication, uncontrolled Yes  . Hyperlipidemia   . Essential hypertension, benign   . Erectile dysfunction       Plan:   Diabetes type 2-continue Lantus 40 units nightly, continue Kombiglyze XR 01/999 mg daily, referral to nutritionist, and given the HgbA1c still at 10% today, although improved, begin Trulicity 1.5mg  weekly.  Discussed proper use, we demonstrated use, had him get his first injection today.  Recheck in 3 months  Hyperlipidemia-continue Lipitor 40 milligrams daily, fasting lipid panel today.  Medication was just started last visit  Hypertension-continue lisinopril 10mg  daily, well-controlled  Erectile dysfunction - doing well on Viagra, refilled medication

## 2013-11-29 NOTE — Assessment & Plan Note (Signed)
  Home blood sugar records: running 150 , 160 doesn't check all the time  Current symptoms/problems include increase urination and have  been stable. Daily foot checks: not daily but does look at them   Any foot concerns: none Last eye exam:  2 years ago   Medication compliance: Current diet: in general, a "healthy" diet   Current exercise: none Known diabetic complications: none Cardiovascular risk factors: diabetes mellitus, dyslipidemia, hypertension and male gender

## 2013-11-29 NOTE — Assessment & Plan Note (Signed)
At his last visit his labs showed abnormal elevated LDL, and we started him on Lipitor which he is taking every day without complaint

## 2013-11-29 NOTE — Assessment & Plan Note (Signed)
Did well with trial of Cialis, would like to continue this

## 2013-11-29 NOTE — Assessment & Plan Note (Signed)
Compliant with medication without complaint 

## 2013-11-30 ENCOUNTER — Other Ambulatory Visit: Payer: Self-pay | Admitting: Medical

## 2013-11-30 MED ORDER — ATORVASTATIN CALCIUM 80 MG PO TABS: 80.0000 mg | ORAL_TABLET | Freq: Every day | ORAL | Status: DC

## 2013-12-02 ENCOUNTER — Other Ambulatory Visit: Payer: Self-pay | Admitting: Family Medicine

## 2013-12-02 ENCOUNTER — Telehealth: Payer: Self-pay | Admitting: Family Medicine

## 2013-12-02 DIAGNOSIS — E1165 Type 2 diabetes mellitus with hyperglycemia: Principal | ICD-10-CM

## 2013-12-02 DIAGNOSIS — IMO0001 Reserved for inherently not codable concepts without codable children: Secondary | ICD-10-CM

## 2013-12-02 NOTE — Telephone Encounter (Signed)
Referral done in [facility]. They will contact him to set his appointment. CLS

## 2013-12-02 NOTE — Telephone Encounter (Signed)
Message copied by Armanda Magic on Mon Dec 02, 2013 12:48 PM ------      Message from: Carlena Hurl      Created: Fri Nov 29, 2013 11:30 AM       Refer to diabetes education/nutritionist ------

## 2013-12-03 ENCOUNTER — Telehealth: Payer: Self-pay | Admitting: Family Medicine

## 2013-12-03 NOTE — Telephone Encounter (Signed)
Message copied by Armanda Magic on Tue Dec 03, 2013  3:24 PM ------      Message from: Rayne, Camelia Eng      Created: Fri Nov 29, 2013  2:34 PM       regarding Cialis, I sent 10 mg which is the higher dose so he will need to cut these in half. ------

## 2013-12-03 NOTE — Telephone Encounter (Signed)
Patient is aware. CLS 

## 2014-01-31 ENCOUNTER — Telehealth: Payer: Self-pay | Admitting: Internal Medicine

## 2014-01-31 NOTE — Telephone Encounter (Signed)
[facility] called stating that his lantus directions to not make sense and they do not know how the pt can inject 0.58ml. Please call debra cumming [phone] and give her the correct instructions.

## 2014-01-31 NOTE — Telephone Encounter (Signed)
Called and corrected this at pharmacy

## 2014-01-31 NOTE — Telephone Encounter (Signed)
pls call them back.  Should be 40 units daily, there was error on the script apparently.

## 2014-02-01 ENCOUNTER — Telehealth: Payer: Self-pay | Admitting: Medical

## 2014-02-04 NOTE — Telephone Encounter (Signed)
P.A. Cialis approved til 09/18/38, pt informed, faxed pharmacy

## 2014-03-11 ENCOUNTER — Ambulatory Visit: Payer: 59 | Admitting: Medical

## 2016-09-30 ENCOUNTER — Emergency Department (HOSPITAL_COMMUNITY)
Admission: EM | Admit: 2016-09-30 | Discharge: 2016-09-30 | Disposition: A | Discharge status: Home / Self Care | Payer: PRIVATE HEALTH INSURANCE | Attending: Emergency Medicine | Admitting: Emergency Medicine

## 2016-09-30 ENCOUNTER — Encounter (HOSPITAL_COMMUNITY): Payer: Self-pay | Admitting: Emergency Medicine

## 2016-09-30 DIAGNOSIS — F17210 Nicotine dependence, cigarettes, uncomplicated: Secondary | ICD-10-CM | POA: Diagnosis not present

## 2016-09-30 DIAGNOSIS — Z794 Long term (current) use of insulin: Secondary | ICD-10-CM | POA: Diagnosis not present

## 2016-09-30 DIAGNOSIS — I10 Essential (primary) hypertension: Secondary | ICD-10-CM | POA: Insufficient documentation

## 2016-09-30 DIAGNOSIS — E119 Type 2 diabetes mellitus without complications: Secondary | ICD-10-CM | POA: Diagnosis not present

## 2016-09-30 DIAGNOSIS — J0100 Acute maxillary sinusitis, unspecified: Secondary | ICD-10-CM | POA: Insufficient documentation

## 2016-09-30 DIAGNOSIS — R0981 Nasal congestion: Secondary | ICD-10-CM | POA: Insufficient documentation

## 2016-09-30 DIAGNOSIS — Z79899 Other long term (current) drug therapy: Secondary | ICD-10-CM | POA: Insufficient documentation

## 2016-09-30 LAB — URINALYSIS, ROUTINE W REFLEX MICROSCOPIC
BILIRUBIN URINE: NEGATIVE
Bacteria, UA: NONE SEEN
Glucose, UA: >=500 mg/dL — AB
HGB URINE DIPSTICK: NEGATIVE
KETONES UR: NEGATIVE mg/dL
LEUKOCYTES UA: NEGATIVE
NITRITE: NEGATIVE
PROTEIN: NEGATIVE mg/dL
Specific Gravity, Urine: 1.027 (ref 1.005–1.030)
pH: 5 (ref 5.0–8.0)

## 2016-09-30 LAB — BASIC METABOLIC PANEL
Anion gap: 7 (ref 5–15)
BUN: 10 mg/dL (ref 6–20)
CO2: 26 mmol/L (ref 22–32)
Calcium: 8.9 mg/dL (ref 8.9–10.3)
Chloride: 107 mmol/L (ref 101–111)
Creatinine, Ser: 0.85 mg/dL (ref 0.61–1.24)
GFR calc Af Amer: >60 mL/min (ref >60)
GFR calc non Af Amer: >60 mL/min (ref >60)
GLUCOSE: 225 mg/dL — AB (ref 65–99)
POTASSIUM: 4.3 mmol/L (ref 3.5–5.1)
Sodium: 140 mmol/L (ref 135–145)

## 2016-09-30 LAB — CBC
HCT: 41.2 % (ref 39.0–52.0)
HEMOGLOBIN: 13.9 g/dL (ref 13.0–17.0)
MCH: 28.3 pg (ref 26.0–34.0)
MCHC: 33.7 g/dL (ref 30.0–36.0)
MCV: 83.9 fL (ref 78.0–100.0)
Platelets: 311 10*3/uL (ref 150–400)
RBC: 4.91 MIL/uL (ref 4.22–5.81)
RDW: 13.3 % (ref 11.5–15.5)
WBC: 7.9 10*3/uL (ref 4.0–10.5)

## 2016-09-30 LAB — CBG MONITORING, ED: Glucose-Capillary: 289 mg/dL — ABNORMAL HIGH (ref 65–99)

## 2016-09-30 MED ORDER — AMOXICILLIN-POT CLAVULANATE 875-125 MG PO TABS: 1.0000 | ORAL_TABLET | Freq: Two times a day (BID) | ORAL | 0 refills | Status: DC

## 2016-09-30 NOTE — ED Provider Notes (Signed)
[facility] DEPT Provider Note   CSN: [id number] Arrival date & time: 09/30/16  1126     History   Chief Complaint Chief Complaint  Patient presents with  . Headache    HPI Tommy Jimenez is a 51 y.o. male.  HPI   Tommy Jimenez is a 51 y.o. male, with a history of DM and HTN, presenting to the ED with sinus congestion, drainage, and pain for the last 3 weeks.  Associated headache. Has not tried any medications for his symptoms. Denies dizziness, fever, vomiting, neuro deficits, or any other complaints.    Past Medical History:  Diagnosis Date  . DM (diabetes mellitus) ([place]) 1996  . Erectile dysfunction   . HTN (hypertension)     Patient Active Problem List   Diagnosis Date Noted  . Type II or unspecified type diabetes mellitus without mention of complication, uncontrolled 11/29/2013  . Hyperlipidemia 11/29/2013  . Essential hypertension, benign 11/29/2013  . Erectile dysfunction 11/29/2013    History reviewed. No pertinent surgical history.     Home Medications    Prior to Admission medications   Medication Sig Start Date End Date Taking? Authorizing Provider  acetaminophen (TYLENOL) 500 MG tablet Take 1,000 mg by mouth every 6 (six) hours as needed for moderate pain.   Yes Historical Provider, MD  amoxicillin-clavulanate (AUGMENTIN) 875-125 MG tablet Take 1 tablet by mouth every 12 (twelve) hours. 09/30/16   Keelon Zurn C Mikiala Fugett, PA-C  atorvastatin (LIPITOR) 80 MG tablet Take 1 tablet (80 mg total) by mouth daily. Patient not taking: Reported on 09/30/2016 11/30/13   Camelia Eng Tysinger, PA-C  Dulaglutide (TRULICITY) 1.5 0000000 SOPN Inject 1.5 mg into the skin once a week. Patient not taking: Reported on 09/30/2016 11/29/13   Camelia Eng Tysinger, PA-C  Insulin Glargine (LANTUS) 100 UNIT/ML Solostar Pen Inject 0.4 Units into the skin daily at 10 pm. Patient not taking: Reported on 09/30/2016 11/29/13   Camelia Eng Tysinger, PA-C  lisinopril (PRINIVIL,ZESTRIL) 10 MG tablet Take  1 tablet (10 mg total) by mouth daily. Patient not taking: Reported on 09/30/2016 11/29/13   Camelia Eng Tysinger, PA-C  Saxagliptin-Metformin (KOMBIGLYZE XR) 01-999 MG TB24 Take 1 tablet by mouth daily. Patient not taking: Reported on 09/30/2016 11/29/13   Camelia Eng Tysinger, PA-C  sildenafil (VIAGRA) 50 MG tablet Take 1 tablet (50 mg total) by mouth daily as needed for erectile dysfunction. 05/07/12 06/06/12  Areta Haber Dunn, PA-C  tadalafil (CIALIS) 10 MG tablet 1/2-1 tablet prn Patient not taking: Reported on 09/30/2016 11/29/13   Carlena Hurl, PA-C    Family History Family History  Problem Relation Age of Onset  . Diabetes Mother     complications of diabetes  . Heart disease Father     died of MI, died age 26yo  . Hypertension Father   . Multiple sclerosis Sister   . Cancer Brother     brain tumor  . Diabetes Brother   . Lupus Sister   . Lupus Sister   . Stroke Neg Hx     Social History Social History  Substance Use Topics  . Smoking status: Current Some Day Smoker    Years: 20.00    Types: Cigarettes  . Smokeless tobacco: Not on file  . Alcohol use Yes     Comment: socially - beer     Allergies   Patient has no known allergies.   Review of Systems Review of Systems  Constitutional: Negative for chills and fever.  HENT: Positive for congestion,  sinus pain and sinus pressure. Negative for facial swelling.   Respiratory: Negative for cough and shortness of breath.   Gastrointestinal: Negative for nausea and vomiting.     Physical Exam Updated Vital Signs BP 152/97   Pulse 81   Temp 98.3 F (36.8 C) (Oral)   Resp 16   SpO2 100%   Physical Exam  Constitutional: He appears well-developed and well-nourished. No distress.  HENT:  Head: Normocephalic and atraumatic.  Nose: Nose normal.  Mouth/Throat: Oropharynx is clear and moist.  Tenderness over the bilateral maxillary sinuses.  Eyes: Conjunctivae and EOM are normal. Pupils are equal, round, and reactive to light.   Neck: Neck supple.  Cardiovascular: Normal rate, regular rhythm, normal heart sounds and intact distal pulses.   Pulmonary/Chest: Effort normal and breath sounds normal. No respiratory distress.  Abdominal: Soft. There is no tenderness. There is no guarding.  Musculoskeletal: He exhibits no edema.  Normal motor function intact in all extremities and spine. No midline spinal tenderness.   Lymphadenopathy:    He has no cervical adenopathy.  Neurological: He is alert.  No sensory deficits. Strength 5/5 in all extremities. No gait disturbance. Coordination intact including heel to shin and finger to nose. Cranial nerves III-XII grossly intact. No facial droop.   Skin: Skin is warm and dry. He is not diaphoretic.  Psychiatric: He has a normal mood and affect. His behavior is normal.  Nursing note and vitals reviewed.    ED Treatments / Results  Labs (all labs ordered are listed, but only abnormal results are displayed) Labs Reviewed  BASIC METABOLIC PANEL - Abnormal; Notable for the following:       Result Value   Glucose, Bld 225 (*)    All other components within normal limits  URINALYSIS, ROUTINE W REFLEX MICROSCOPIC - Abnormal; Notable for the following:    APPearance HAZY (*)    Glucose, UA >=500 (*)    Squamous Epithelial / LPF 0-5 (*)    All other components within normal limits  CBG MONITORING, ED - Abnormal; Notable for the following:    Glucose-Capillary 289 (*)    All other components within normal limits  CBC    EKG  EKG Interpretation None       Radiology No results found.  Procedures Procedures (including critical care time)  Medications Ordered in ED Medications - No data to display   Initial Impression / Assessment and Plan / ED Course  I have reviewed the triage vital signs and the nursing notes.  Pertinent labs & imaging results that were available during my care of the patient were reviewed by me and considered in my medical decision making (see  chart for details).  Clinical Course     Patient presents with sinus pain and congestion. Suspect associated headache is a sinus headache. Antibiotic therapy due to duration of symptoms and tenderness over the sinuses. Patient is nontoxic appearing and vitals are normal. PCP follow-up. Return precautions discussed.  Vitals:   09/30/16 1147 09/30/16 1443 09/30/16 1809  BP: 163/89 152/97 153/86  Pulse: 94 81 81  Resp: 18 16 15   Temp: 98.3 F (36.8 C)  98.2 F (36.8 C)  TempSrc: Oral    SpO2: 99% 100% 100%     Final Clinical Impressions(s) / ED Diagnoses   Final diagnoses:  Acute non-recurrent maxillary sinusitis    New Prescriptions Discharge Medication List as of 09/30/2016  5:00 PM    START taking these medications   Details  amoxicillin-clavulanate (AUGMENTIN) 875-125 MG tablet Take 1 tablet by mouth every 12 (twelve) hours., Starting Fri 09/30/2016, [place], PA-C 10/01/16 [place], MD 10/01/16 [phone]

## 2016-09-30 NOTE — ED Notes (Signed)
Asked patient to provide urine sample but he stated that he could not go at this time

## 2016-09-30 NOTE — ED Triage Notes (Signed)
Pt complaint of continued headache and nasal congestion for 3 weeks; pt denies hx of hypertension.

## 2016-09-30 NOTE — ED Notes (Signed)
Patient d/c'd self care.  F/U and medications reviewed.  Patient verbalized understanding. 

## 2016-09-30 NOTE — Discharge Instructions (Signed)
You have symptoms consistent with a sinus infection. Due to the duration of symptoms, antibiotics are warranted. Please take all of your antibiotics until finished!   You may develop abdominal discomfort or diarrhea from the antibiotic.  You may help offset this with probiotics which you can buy or get in yogurt. Do not eat or take the probiotics until 2 hours after your antibiotic.   Additional treatment is symptomatic care and it is important to note that these symptoms may last for 7-10 days. Drink plenty of fluids and get plenty of rest. You should be drinking at least half a liter of water an hour to stay hydrated. Electrolyte drinks are also encouraged. Ibuprofen, Naproxen, or Tylenol for pain or fever. Plain Mucinex may help relieve congestion. Warm liquids or Chloraseptic spray may help soothe a sore throat. Follow up with a primary care provider, as needed, for any future management of this issue.  Sinus rinses may help reduce congestion as well.

## 2016-09-30 NOTE — ED Notes (Signed)
With triage pt verbalizes diabetes and has not taken insulin or checked sugar for 2 years.

## 2017-05-30 DIAGNOSIS — I213 ST elevation (STEMI) myocardial infarction of unspecified site: Secondary | ICD-10-CM | POA: Insufficient documentation

## 2017-05-30 DIAGNOSIS — I252 Old myocardial infarction: Secondary | ICD-10-CM | POA: Insufficient documentation

## 2017-05-31 DIAGNOSIS — Z720 Tobacco use: Secondary | ICD-10-CM | POA: Insufficient documentation

## 2017-05-31 DIAGNOSIS — Z87891 Personal history of nicotine dependence: Secondary | ICD-10-CM | POA: Insufficient documentation

## 2017-06-21 DIAGNOSIS — I255 Ischemic cardiomyopathy: Secondary | ICD-10-CM | POA: Insufficient documentation

## 2017-06-21 DIAGNOSIS — Z87891 Personal history of nicotine dependence: Secondary | ICD-10-CM | POA: Insufficient documentation

## 2017-06-21 DIAGNOSIS — Z7982 Long term (current) use of aspirin: Secondary | ICD-10-CM | POA: Insufficient documentation

## 2017-06-21 DIAGNOSIS — I420 Dilated cardiomyopathy: Secondary | ICD-10-CM | POA: Insufficient documentation

## 2017-11-20 ENCOUNTER — Encounter (HOSPITAL_COMMUNITY): Payer: Self-pay

## 2017-11-20 ENCOUNTER — Emergency Department (HOSPITAL_BASED_OUTPATIENT_CLINIC_OR_DEPARTMENT_OTHER)
Admit: 2017-11-20 | Discharge: 2017-11-20 | Disposition: A | Discharge status: Home / Self Care | Payer: Self-pay | Attending: Emergency Medicine | Admitting: Emergency Medicine

## 2017-11-20 ENCOUNTER — Observation Stay (HOSPITAL_COMMUNITY): Payer: Self-pay | Admitting: Certified Registered Nurse Anesthetist

## 2017-11-20 ENCOUNTER — Other Ambulatory Visit: Payer: Self-pay

## 2017-11-20 ENCOUNTER — Inpatient Hospital Stay (HOSPITAL_COMMUNITY)
Admission: EM | Admit: 2017-11-20 | Discharge: 2017-11-22 | DRG: 854 | Disposition: A | Discharge status: Home / Self Care | Payer: Self-pay | Attending: Internal Medicine | Admitting: Internal Medicine

## 2017-11-20 ENCOUNTER — Encounter (HOSPITAL_COMMUNITY)
Admission: EM | Disposition: A | Discharge status: Home / Self Care | Payer: Self-pay | Source: Home / Self Care | Attending: Internal Medicine

## 2017-11-20 DIAGNOSIS — I2510 Atherosclerotic heart disease of native coronary artery without angina pectoris: Secondary | ICD-10-CM | POA: Diagnosis present

## 2017-11-20 DIAGNOSIS — E785 Hyperlipidemia, unspecified: Secondary | ICD-10-CM | POA: Diagnosis present

## 2017-11-20 DIAGNOSIS — Z7902 Long term (current) use of antithrombotics/antiplatelets: Secondary | ICD-10-CM

## 2017-11-20 DIAGNOSIS — L0231 Cutaneous abscess of buttock: Secondary | ICD-10-CM

## 2017-11-20 DIAGNOSIS — I110 Hypertensive heart disease with heart failure: Secondary | ICD-10-CM | POA: Diagnosis present

## 2017-11-20 DIAGNOSIS — A419 Sepsis, unspecified organism: Principal | ICD-10-CM | POA: Diagnosis present

## 2017-11-20 DIAGNOSIS — M79604 Pain in right leg: Secondary | ICD-10-CM | POA: Diagnosis present

## 2017-11-20 DIAGNOSIS — Z7982 Long term (current) use of aspirin: Secondary | ICD-10-CM

## 2017-11-20 DIAGNOSIS — Z7984 Long term (current) use of oral hypoglycemic drugs: Secondary | ICD-10-CM

## 2017-11-20 DIAGNOSIS — E1165 Type 2 diabetes mellitus with hyperglycemia: Secondary | ICD-10-CM | POA: Diagnosis present

## 2017-11-20 DIAGNOSIS — Z955 Presence of coronary angioplasty implant and graft: Secondary | ICD-10-CM

## 2017-11-20 DIAGNOSIS — Z87891 Personal history of nicotine dependence: Secondary | ICD-10-CM

## 2017-11-20 DIAGNOSIS — E11628 Type 2 diabetes mellitus with other skin complications: Secondary | ICD-10-CM

## 2017-11-20 DIAGNOSIS — I252 Old myocardial infarction: Secondary | ICD-10-CM

## 2017-11-20 DIAGNOSIS — Z8674 Personal history of sudden cardiac arrest: Secondary | ICD-10-CM

## 2017-11-20 DIAGNOSIS — R609 Edema, unspecified: Secondary | ICD-10-CM

## 2017-11-20 DIAGNOSIS — E119 Type 2 diabetes mellitus without complications: Secondary | ICD-10-CM

## 2017-11-20 DIAGNOSIS — K611 Rectal abscess: Secondary | ICD-10-CM | POA: Diagnosis present

## 2017-11-20 DIAGNOSIS — I5042 Chronic combined systolic (congestive) and diastolic (congestive) heart failure: Secondary | ICD-10-CM | POA: Diagnosis present

## 2017-11-20 DIAGNOSIS — Z79899 Other long term (current) drug therapy: Secondary | ICD-10-CM

## 2017-11-20 DIAGNOSIS — L03317 Cellulitis of buttock: Secondary | ICD-10-CM | POA: Diagnosis present

## 2017-11-20 DIAGNOSIS — L0291 Cutaneous abscess, unspecified: Secondary | ICD-10-CM

## 2017-11-20 DIAGNOSIS — Z86718 Personal history of other venous thrombosis and embolism: Secondary | ICD-10-CM

## 2017-11-20 DIAGNOSIS — I10 Essential (primary) hypertension: Secondary | ICD-10-CM | POA: Diagnosis present

## 2017-11-20 HISTORY — PX: INCISION AND DRAINAGE PERIRECTAL ABSCESS: SHX1804

## 2017-11-20 HISTORY — DX: Peripheral vascular disease, unspecified: I73.9

## 2017-11-20 HISTORY — DX: Acute embolism and thrombosis of unspecified deep veins of unspecified lower extremity: I82.409

## 2017-11-20 HISTORY — DX: Atherosclerotic heart disease of native coronary artery without angina pectoris: I25.10

## 2017-11-20 HISTORY — DX: Acute myocardial infarction, unspecified: I21.9

## 2017-11-20 LAB — CBC WITH DIFFERENTIAL/PLATELET
BASOS PCT: 0 %
Basophils Absolute: 0 10*3/uL (ref 0.0–0.1)
EOS PCT: 1 %
Eosinophils Absolute: 0.2 10*3/uL (ref 0.0–0.7)
HCT: 37.8 % — ABNORMAL LOW (ref 39.0–52.0)
HEMOGLOBIN: 12.7 g/dL — AB (ref 13.0–17.0)
LYMPHS PCT: 16 %
Lymphs Abs: 2.5 10*3/uL (ref 0.7–4.0)
MCH: 28.7 pg (ref 26.0–34.0)
MCHC: 33.6 g/dL (ref 30.0–36.0)
MCV: 85.3 fL (ref 78.0–100.0)
MONO ABS: 2.2 10*3/uL — AB (ref 0.1–1.0)
MONOS PCT: 14 %
NEUTROS PCT: 69 %
Neutro Abs: 10.8 10*3/uL — ABNORMAL HIGH (ref 1.7–7.7)
PLATELETS: 371 10*3/uL (ref 150–400)
RBC: 4.43 MIL/uL (ref 4.22–5.81)
RDW: 12.7 % (ref 11.5–15.5)
WBC: 15.7 10*3/uL — AB (ref 4.0–10.5)

## 2017-11-20 LAB — CREATININE, SERUM
Creatinine, Ser: 0.78 mg/dL (ref 0.61–1.24)
GFR calc Af Amer: >60 mL/min (ref >60)

## 2017-11-20 LAB — COMPREHENSIVE METABOLIC PANEL
ALT: 11 U/L — ABNORMAL LOW (ref 17–63)
ANION GAP: 9 (ref 5–15)
AST: 26 U/L (ref 15–41)
Albumin: 3.6 g/dL (ref 3.5–5.0)
Alkaline Phosphatase: 90 U/L (ref 38–126)
BILIRUBIN TOTAL: 1.1 mg/dL (ref 0.3–1.2)
BUN: 10 mg/dL (ref 6–20)
CO2: 24 mmol/L (ref 22–32)
Calcium: 8.6 mg/dL — ABNORMAL LOW (ref 8.9–10.3)
Chloride: 100 mmol/L — ABNORMAL LOW (ref 101–111)
Creatinine, Ser: 0.93 mg/dL (ref 0.61–1.24)
GFR calc non Af Amer: >60 mL/min (ref >60)
Glucose, Bld: 252 mg/dL — ABNORMAL HIGH (ref 65–99)
POTASSIUM: 4.5 mmol/L (ref 3.5–5.1)
Sodium: 133 mmol/L — ABNORMAL LOW (ref 135–145)
TOTAL PROTEIN: 7.6 g/dL (ref 6.5–8.1)

## 2017-11-20 LAB — HEMOGLOBIN A1C
Hgb A1c MFr Bld: 11.6 % — ABNORMAL HIGH (ref 4.8–5.6)
Mean Plasma Glucose: 286.22 mg/dL

## 2017-11-20 LAB — I-STAT CG4 LACTIC ACID, ED: Lactic Acid, Venous: 1.45 mmol/L (ref 0.5–1.9)

## 2017-11-20 LAB — GLUCOSE, CAPILLARY
GLUCOSE-CAPILLARY: 208 mg/dL — AB (ref 65–99)
GLUCOSE-CAPILLARY: 265 mg/dL — AB (ref 65–99)

## 2017-11-20 SURGERY — INCISION AND DRAINAGE, ABSCESS, PERIRECTAL
Anesthesia: General | Site: Buttocks | Laterality: Left

## 2017-11-20 MED ORDER — ACETAMINOPHEN 325 MG PO TABS
650.0000 mg | ORAL_TABLET | Freq: Four times a day (QID) | ORAL | Status: DC | PRN
  Administered 2017-11-21: 650 mg via ORAL
  Filled 2017-11-20: qty 2

## 2017-11-20 MED ORDER — METOPROLOL TARTRATE 25 MG PO TABS
37.5000 mg | ORAL_TABLET | Freq: Two times a day (BID) | ORAL | Status: DC
  Administered 2017-11-20 – 2017-11-22 (×4): 37.5 mg via ORAL
  Filled 2017-11-20 (×4): qty 1

## 2017-11-20 MED ORDER — MEPERIDINE HCL 50 MG/ML IJ SOLN
INTRAMUSCULAR | Status: AC
  Administered 2017-11-20: 12.5 mg via INTRAVENOUS
  Filled 2017-11-20: qty 1

## 2017-11-20 MED ORDER — SODIUM CHLORIDE 0.9% FLUSH
3.0000 mL | Freq: Two times a day (BID) | INTRAVENOUS | Status: DC
  Administered 2017-11-21: 10:00:00 3 mL via INTRAVENOUS

## 2017-11-20 MED ORDER — LIDOCAINE HCL (PF) 1 % IJ SOLN
INTRAMUSCULAR | Status: AC
  Filled 2017-11-20: qty 30

## 2017-11-20 MED ORDER — FENTANYL CITRATE (PF) 100 MCG/2ML IJ SOLN
INTRAMUSCULAR | Status: DC | PRN
  Administered 2017-11-20 (×2): 50 ug via INTRAVENOUS

## 2017-11-20 MED ORDER — SODIUM CHLORIDE 0.9% FLUSH: 3.0000 mL | INTRAVENOUS | Status: DC | PRN

## 2017-11-20 MED ORDER — MIDAZOLAM HCL 2 MG/2ML IJ SOLN
INTRAMUSCULAR | Status: AC
  Filled 2017-11-20: qty 2

## 2017-11-20 MED ORDER — INSULIN ASPART 100 UNIT/ML ~~LOC~~ SOLN
3.0000 [IU] | Freq: Once | SUBCUTANEOUS | Status: AC
  Administered 2017-11-20: 3 [IU] via SUBCUTANEOUS

## 2017-11-20 MED ORDER — BUPIVACAINE-EPINEPHRINE (PF) 0.5% -1:200000 IJ SOLN
INTRAMUSCULAR | Status: AC
  Filled 2017-11-20: qty 30

## 2017-11-20 MED ORDER — ONDANSETRON HCL 4 MG/2ML IJ SOLN
INTRAMUSCULAR | Status: DC | PRN
  Administered 2017-11-20: 4 mg via INTRAVENOUS

## 2017-11-20 MED ORDER — VANCOMYCIN HCL IN DEXTROSE 1-5 GM/200ML-% IV SOLN
1000.0000 mg | Freq: Once | INTRAVENOUS | Status: AC
  Administered 2017-11-20: 1000 mg via INTRAVENOUS
  Filled 2017-11-20: qty 200

## 2017-11-20 MED ORDER — DEXAMETHASONE SODIUM PHOSPHATE 10 MG/ML IJ SOLN
INTRAMUSCULAR | Status: AC
Start: 2017-11-20 — End: ?
  Filled 2017-11-20: qty 1

## 2017-11-20 MED ORDER — SODIUM CHLORIDE 0.9 % IV BOLUS (SEPSIS)
1000.0000 mL | Freq: Once | INTRAVENOUS | Status: AC
  Administered 2017-11-20: 1000 mL via INTRAVENOUS

## 2017-11-20 MED ORDER — PHENYLEPHRINE 40 MCG/ML (10ML) SYRINGE FOR IV PUSH (FOR BLOOD PRESSURE SUPPORT)
PREFILLED_SYRINGE | INTRAVENOUS | Status: DC | PRN
  Administered 2017-11-20 (×3): 80 ug via INTRAVENOUS

## 2017-11-20 MED ORDER — PHENYLEPHRINE 40 MCG/ML (10ML) SYRINGE FOR IV PUSH (FOR BLOOD PRESSURE SUPPORT)
PREFILLED_SYRINGE | INTRAVENOUS | Status: AC
  Filled 2017-11-20: qty 10

## 2017-11-20 MED ORDER — TRAMADOL HCL 50 MG PO TABS
100.0000 mg | ORAL_TABLET | Freq: Four times a day (QID) | ORAL | Status: DC | PRN
  Administered 2017-11-21: 09:00:00 100 mg via ORAL
  Filled 2017-11-20: qty 2

## 2017-11-20 MED ORDER — ONDANSETRON HCL 4 MG/2ML IJ SOLN
INTRAMUSCULAR | Status: AC
  Filled 2017-11-20: qty 2

## 2017-11-20 MED ORDER — KETOROLAC TROMETHAMINE 30 MG/ML IJ SOLN: 30.0000 mg | Freq: Once | INTRAMUSCULAR | Status: DC | PRN

## 2017-11-20 MED ORDER — HYDROMORPHONE HCL 1 MG/ML IJ SOLN: 0.2500 mg | INTRAMUSCULAR | Status: DC | PRN

## 2017-11-20 MED ORDER — MORPHINE SULFATE (PF) 4 MG/ML IV SOLN
4.0000 mg | Freq: Once | INTRAVENOUS | Status: AC
  Administered 2017-11-20: 4 mg via INTRAVENOUS
  Filled 2017-11-20: qty 1

## 2017-11-20 MED ORDER — PROPOFOL 10 MG/ML IV BOLUS
INTRAVENOUS | Status: AC
  Filled 2017-11-20: qty 20

## 2017-11-20 MED ORDER — SODIUM CHLORIDE 0.9 % IV BOLUS (SEPSIS)
1000.0000 mL | Freq: Once | INTRAVENOUS | Status: AC
Start: 2017-11-20 — End: 2017-11-20
  Administered 2017-11-20: 1000 mL via INTRAVENOUS

## 2017-11-20 MED ORDER — INSULIN ASPART 100 UNIT/ML ~~LOC~~ SOLN
0.0000 [IU] | Freq: Three times a day (TID) | SUBCUTANEOUS | Status: DC
  Administered 2017-11-21: 13:00:00 5 [IU] via SUBCUTANEOUS
  Administered 2017-11-21: 3 [IU] via SUBCUTANEOUS
  Administered 2017-11-21: 5 [IU] via SUBCUTANEOUS
  Administered 2017-11-22: 2 [IU] via SUBCUTANEOUS

## 2017-11-20 MED ORDER — LISINOPRIL 5 MG PO TABS
5.0000 mg | ORAL_TABLET | Freq: Every day | ORAL | Status: DC
  Administered 2017-11-21 – 2017-11-22 (×2): 5 mg via ORAL
  Filled 2017-11-20 (×3): qty 1

## 2017-11-20 MED ORDER — MEPERIDINE HCL 50 MG/ML IJ SOLN
6.2500 mg | INTRAMUSCULAR | Status: DC | PRN
  Administered 2017-11-20: 12.5 mg via INTRAVENOUS

## 2017-11-20 MED ORDER — PROPOFOL 10 MG/ML IV BOLUS
INTRAVENOUS | Status: DC | PRN
  Administered 2017-11-20: 150 mg via INTRAVENOUS
  Administered 2017-11-20: 50 mg via INTRAVENOUS

## 2017-11-20 MED ORDER — MIDAZOLAM HCL 5 MG/5ML IJ SOLN
INTRAMUSCULAR | Status: DC | PRN
  Administered 2017-11-20: 2 mg via INTRAVENOUS

## 2017-11-20 MED ORDER — ACETAMINOPHEN 500 MG PO TABS
1000.0000 mg | ORAL_TABLET | Freq: Once | ORAL | Status: AC
  Administered 2017-11-20: 1000 mg via ORAL
  Filled 2017-11-20: qty 2

## 2017-11-20 MED ORDER — LIDOCAINE 2% (20 MG/ML) 5 ML SYRINGE
INTRAMUSCULAR | Status: AC
  Filled 2017-11-20: qty 5

## 2017-11-20 MED ORDER — INSULIN ASPART 100 UNIT/ML ~~LOC~~ SOLN
SUBCUTANEOUS | Status: AC
  Administered 2017-11-20: 3 [IU] via SUBCUTANEOUS
  Filled 2017-11-20: qty 1

## 2017-11-20 MED ORDER — VANCOMYCIN HCL IN DEXTROSE 1-5 GM/200ML-% IV SOLN
1000.0000 mg | Freq: Two times a day (BID) | INTRAVENOUS | Status: DC
  Administered 2017-11-20 – 2017-11-22 (×4): 1000 mg via INTRAVENOUS
  Filled 2017-11-20 (×5): qty 200

## 2017-11-20 MED ORDER — SODIUM CHLORIDE 0.9 % IR SOLN
Status: DC | PRN
  Administered 2017-11-20: 1000 mL

## 2017-11-20 MED ORDER — PIPERACILLIN-TAZOBACTAM 3.375 G IVPB
3.3750 g | Freq: Three times a day (TID) | INTRAVENOUS | Status: DC
  Administered 2017-11-20 – 2017-11-22 (×6): 3.375 g via INTRAVENOUS
  Filled 2017-11-20 (×8): qty 50

## 2017-11-20 MED ORDER — FENTANYL CITRATE (PF) 250 MCG/5ML IJ SOLN
INTRAMUSCULAR | Status: AC
  Filled 2017-11-20: qty 5

## 2017-11-20 MED ORDER — PROMETHAZINE HCL 25 MG/ML IJ SOLN: 6.2500 mg | INTRAMUSCULAR | Status: DC | PRN

## 2017-11-20 MED ORDER — LACTATED RINGERS IV SOLN
INTRAVENOUS | Status: DC
  Administered 2017-11-20 (×2): via INTRAVENOUS

## 2017-11-20 MED ORDER — VANCOMYCIN HCL IN DEXTROSE 1-5 GM/200ML-% IV SOLN: 1000.0000 mg | INTRAVENOUS | Status: DC

## 2017-11-20 MED ORDER — PIPERACILLIN-TAZOBACTAM 3.375 G IVPB 30 MIN: 3.3750 g | Freq: Three times a day (TID) | INTRAVENOUS | Status: DC

## 2017-11-20 MED ORDER — SODIUM CHLORIDE 0.9 % IV SOLN
250.0000 mL | INTRAVENOUS | Status: DC | PRN
  Administered 2017-11-20: 1000 mL via INTRAVENOUS

## 2017-11-20 MED ORDER — PIPERACILLIN-TAZOBACTAM 3.375 G IVPB 30 MIN
3.3750 g | Freq: Once | INTRAVENOUS | Status: AC
  Administered 2017-11-20: 3.375 g via INTRAVENOUS
  Filled 2017-11-20: qty 50

## 2017-11-20 MED ORDER — LIDOCAINE 2% (20 MG/ML) 5 ML SYRINGE
INTRAMUSCULAR | Status: DC | PRN
  Administered 2017-11-20: 60 mg via INTRAVENOUS

## 2017-11-20 MED ORDER — SODIUM CHLORIDE 0.9 % IV BOLUS (SEPSIS)
500.0000 mL | Freq: Once | INTRAVENOUS | Status: AC
  Administered 2017-11-20: 500 mL via INTRAVENOUS

## 2017-11-20 MED ORDER — ACETAMINOPHEN 650 MG RE SUPP: 650.0000 mg | Freq: Four times a day (QID) | RECTAL | Status: DC | PRN

## 2017-11-20 SURGICAL SUPPLY — 18 items
BRIEF STRETCH FOR OB PAD LRG (UNDERPADS AND DIAPERS) ×6 IMPLANT
COVER SURGICAL LIGHT HANDLE (MISCELLANEOUS) ×3 IMPLANT
GAUZE PACKING IODOFORM 1X5 (MISCELLANEOUS) ×3 IMPLANT
GAUZE SPONGE 4X4 12PLY STRL (GAUZE/BANDAGES/DRESSINGS) ×3 IMPLANT
GLOVE BIO SURGEON STRL SZ 6 (GLOVE) ×3 IMPLANT
GLOVE BIOGEL PI IND STRL 6.5 (GLOVE) ×1 IMPLANT
GLOVE BIOGEL PI IND STRL 7.0 (GLOVE) ×1 IMPLANT
GLOVE BIOGEL PI INDICATOR 6.5 (GLOVE) ×2
GLOVE BIOGEL PI INDICATOR 7.0 (GLOVE) ×2
GLOVE INDICATOR 6.5 STRL GRN (GLOVE) ×3 IMPLANT
GOWN STRL REUS W/ TWL XL LVL3 (GOWN DISPOSABLE) ×3 IMPLANT
GOWN STRL REUS W/TWL 2XL LVL3 (GOWN DISPOSABLE) ×3 IMPLANT
GOWN STRL REUS W/TWL XL LVL3 (GOWN DISPOSABLE) ×6
PACK BASIC (CUSTOM PROCEDURE TRAY) ×3 IMPLANT
PAD ABD 7.5X8 STRL (GAUZE/BANDAGES/DRESSINGS) ×3 IMPLANT
PENCIL BUTTON HOLSTER BLD 10FT (ELECTRODE) ×3 IMPLANT
SUCTION YANKAUER HANDLE (MISCELLANEOUS) ×3 IMPLANT
TOWEL OR 17X26 10 PK STRL BLUE (TOWEL DISPOSABLE) ×3 IMPLANT

## 2017-11-20 NOTE — Op Note (Signed)
Incision and Drainage/debridement Procedure Note   Pre-operative Diagnosis: perirectal/gluteal abscess   Post-operative Diagnosis: same   Indications: 3 days of worsening pain and swelling on left buttock  Anesthesia: General   Procedure Details  The procedure, risks and complications have been discussed in detail (including, infection, bleeding, need for additional procedures) with the patient, and the patient has signed consent to the procedure. The patient was informed that the wound would be left open.  The patient was taken to OR 4 and general anesthesia was induced. The patient was placed into lithotomy position.  The skin was sterilely prepped and draped over the affected area in the usual fashion. Time out was performed according to the surgical safety checklist.   I&D with a #11 blade was performed on the left posterior perianal region.  Cultures were sent. Additional skin was debrided around the opening. Purulent drainage was present. The pulse lavage was used to irrigate the cavity. The wound was packed with 1" nugauze. The area was dressed with gauze, ABDs, and mesh underwear.  The patient was awakened from anesthesia and taken to the PACU in stable condition.   Findings:  Copious purulent drainage.    EBL: min  Drains: None   Condition: Tolerated procedure well   Complications:  none known.

## 2017-11-20 NOTE — Anesthesia Preprocedure Evaluation (Addendum)
Anesthesia Evaluation  Patient identified by MRN, date of birth, ID band Patient awake    Reviewed: Allergy & Precautions, NPO status , Patient's Chart, lab work & pertinent test results, reviewed documented beta blocker date and time   Airway Mallampati: I       Dental  (+) Poor Dentition, Edentulous Upper, Missing,    Pulmonary former smoker,    Pulmonary exam normal breath sounds clear to auscultation       Cardiovascular hypertension, Pt. on medications and Pt. on home beta blockers Normal cardiovascular exam Rhythm:Regular Rate:Normal     Neuro/Psych negative neurological ROS  negative psych ROS   GI/Hepatic negative GI ROS, Neg liver ROS,   Endo/Other  diabetes, Oral Hypoglycemic Agents  Renal/GU negative Renal ROS     Musculoskeletal negative musculoskeletal ROS (+)   Abdominal Normal abdominal exam  (+)   Peds  Hematology  (+) Blood dyscrasia, anemia ,   Anesthesia Other Findings   Reproductive/Obstetrics                            Anesthesia Physical Anesthesia Plan  ASA: III  Anesthesia Plan: General   Post-op Pain Management:    Induction: Intravenous  PONV Risk Score and Plan: 3 and Ondansetron, Midazolam and Treatment may vary due to age or medical condition  Airway Management Planned: LMA  Additional Equipment:   Intra-op Plan:   Post-operative Plan:   Informed Consent: I have reviewed the patients History and Physical, chart, labs and discussed the procedure including the risks, benefits and alternatives for the proposed anesthesia with the patient or authorized representative who has indicated his/her understanding and acceptance.     Plan Discussed with: CRNA and Surgeon  Anesthesia Plan Comments:         Anesthesia Quick Evaluation

## 2017-11-20 NOTE — ED Notes (Signed)
ED Provider at bedside. 

## 2017-11-20 NOTE — Progress Notes (Signed)
Right lower extremity venous duplex has been completed. Negative for DVT. Results were given to Dr. Billy Fischer.   11/20/17 1:35 PM Tommy Jimenez RVT

## 2017-11-20 NOTE — H&P (Signed)
History and Physical    Tommy Jimenez [id number] DOB: September 30, 1965 DOA: 11/20/2017  PCP: Carlena Hurl, PA-C  Patient coming from: Home  Chief Complaint: Pain and swelling to buttock area  HPI: Tommy Jimenez is a 52 y.o. male with medical history significant of diabetes, hypertension has been having 1 week of painful swelling to left buttock.  There is been no drainage.  Patient has no history of abscesses in the past except whenever he was in high school.  He does not have a history of frequent infections.  He denies shaving in the area.  He states he has been having a sore there for over 4 years that usually does not bother him but he noticed a week ago that that sore started to get bigger.  It is now bigger than the size of a golf ball so he came to the ED to have it evaluated because it is so painful and swollen.  Patient found to have an acute gluteal abscess and referred for admission for IV antibiotics and the need for surgical drainage.  General surgery has been called and they are taking him to the OR today for surgical drainage.  Review of Systems: As per HPI otherwise 10 point review of systems negative.   Past Medical History:  Diagnosis Date  . DM (diabetes mellitus) ([place]) 1996  . Erectile dysfunction   . HTN (hypertension)     Past Surgical History:  Procedure Laterality Date  . CORONARY STENT PLACEMENT       reports that he has quit smoking. His smoking use included cigarettes. He quit after 20.00 years of use. he has never used smokeless tobacco. He reports that he drinks alcohol. He reports that he does not use drugs.  No Known Allergies  Family History  Problem Relation Age of Onset  . Diabetes Mother        complications of diabetes  . Heart disease Father        died of MI, died age 64yo  . Hypertension Father   . Multiple sclerosis Sister   . Cancer Brother        brain tumor  . Diabetes Brother   . Lupus Sister   . Lupus Sister   . Stroke  Neg Hx     Prior to Admission medications   Medication Sig Start Date End Date Taking? Authorizing Provider  aspirin EC 81 MG tablet Take 81 mg by mouth daily.   Yes [provider]  atorvastatin (LIPITOR) 80 MG tablet Take 1 tablet (80 mg total) by mouth daily. 11/30/13  Yes Tysinger, Camelia Eng, PA-C  glipiZIDE (GLUCOTROL XL) 10 MG 24 hr tablet Take 10 mg by mouth daily with breakfast.   Yes [provider]  lisinopril (PRINIVIL,ZESTRIL) 5 MG tablet Take 5 mg by mouth daily.   Yes [provider]  metFORMIN (GLUCOPHAGE) 850 MG tablet Take 850 mg by mouth 2 (two) times daily with a meal.   Yes [provider]  metoprolol tartrate (LOPRESSOR) 25 MG tablet Take 37.5 mg by mouth 2 (two) times daily.   Yes [provider]  ticagrelor (BRILINTA) 90 MG TABS tablet Take 90 mg by mouth 2 (two) times daily.   Yes [provider]  amoxicillin-clavulanate (AUGMENTIN) 875-125 MG tablet Take 1 tablet by mouth every 12 (twelve) hours. Patient not taking: Reported on 11/20/2017 09/30/16   Joy, Raquel Sarna C, PA-C  Dulaglutide (TRULICITY) 1.5 EL/3.8BO SOPN Inject 1.5 mg into the skin once  a week. Patient not taking: Reported on 09/30/2016 11/29/13   Tysinger, Camelia Eng, PA-C  Insulin Glargine (LANTUS) 100 UNIT/ML Solostar Pen Inject 0.4 Units into the skin daily at 10 pm. Patient not taking: Reported on 09/30/2016 11/29/13   Tysinger, Camelia Eng, PA-C  lisinopril (PRINIVIL,ZESTRIL) 10 MG tablet Take 1 tablet (10 mg total) by mouth daily. Patient not taking: Reported on 09/30/2016 11/29/13   Tysinger, Camelia Eng, PA-C  Saxagliptin-Metformin (KOMBIGLYZE XR) 01-999 MG TB24 Take 1 tablet by mouth daily. Patient not taking: Reported on 09/30/2016 11/29/13   Tysinger, Camelia Eng, PA-C  sildenafil (VIAGRA) 50 MG tablet Take 1 tablet (50 mg total) by mouth daily as needed for erectile dysfunction. 05/07/12 06/06/12  Rise Mu, PA-C  tadalafil (CIALIS) 10 MG tablet 1/2-1 tablet prn Patient not  taking: Reported on 09/30/2016 11/29/13   Carlena Hurl, PA-C    Physical Exam: Vitals:   11/20/17 1135 11/20/17 1137 11/20/17 1139 11/20/17 1200  BP: (!) 167/96   (!) 164/92  Pulse: (!) 106   (!) 107  Resp: (!) 23   13  Temp:  (!) 101.1 F (38.4 C)    TempSrc:  Oral    SpO2: 100%   100%  Weight:   75.8 kg (167 lb)   Height:   5\' 8"  (1.727 m)       Constitutional: NAD, calm, comfortable Vitals:   11/20/17 1135 11/20/17 1137 11/20/17 1139 11/20/17 1200  BP: (!) 167/96   (!) 164/92  Pulse: (!) 106   (!) 107  Resp: (!) 23   13  Temp:  (!) 101.1 F (38.4 C)    TempSrc:  Oral    SpO2: 100%   100%  Weight:   75.8 kg (167 lb)   Height:   5\' 8"  (1.727 m)    Eyes: PERRL, lids and conjunctivae normal ENMT: Mucous membranes are moist. Posterior pharynx clear of any exudate or lesions.Normal dentition.  Neck: normal, supple, no masses, no thyromegaly Respiratory: clear to auscultation bilaterally, no wheezing, no crackles. Normal respiratory effort. No accessory muscle use.  Cardiovascular: Regular rate and rhythm, no murmurs / rubs / gallops. No extremity edema. 2+ pedal pulses. No carotid bruits.  Abdomen: no tenderness, no masses palpated. No hepatosplenomegaly. Bowel sounds positive.  Musculoskeletal: no clubbing / cyanosis. No joint deformity upper and lower extremities. Good ROM, no contractures. Normal muscle tone.  Skin: Left gluteal with over 3 cm area of induration with some fluctuance with some surrounding erythema consistent with abscess and cellulitis neurologic: CN 2-12 grossly intact. Sensation intact, DTR normal. Strength 5/5 in all 4.  Psychiatric: Normal judgment and insight. Alert and oriented x 3. Normal mood.    Labs on Admission: I have personally reviewed following labs and imaging studies  CBC: Recent Labs  Lab 11/20/17 1100  WBC 15.7*  NEUTROABS 10.8*  HGB 12.7*  HCT 37.8*  MCV 85.3  PLT 268   Basic Metabolic Panel: Recent Labs  Lab  11/20/17 1100  NA 133*  K 4.5  CL 100*  CO2 24  GLUCOSE 252*  BUN 10  CREATININE 0.93  CALCIUM 8.6*   GFR: Estimated Creatinine Clearance: 91.9 mL/min (by C-G formula based on SCr of 0.93 mg/dL). Liver Function Tests: Recent Labs  Lab 11/20/17 1100  AST 26  ALT 11*  ALKPHOS 90  BILITOT 1.1  PROT 7.6  ALBUMIN 3.6   No results for input(s): LIPASE, AMYLASE in the last 168 hours. No results for input(s): AMMONIA  in the last 168 hours. Coagulation Profile: No results for input(s): INR, PROTIME in the last 168 hours. Cardiac Enzymes: No results for input(s): CKTOTAL, CKMB, CKMBINDEX, TROPONINI in the last 168 hours. BNP (last 3 results) No results for input(s): PROBNP in the last 8760 hours. HbA1C: No results for input(s): HGBA1C in the last 72 hours. CBG: No results for input(s): GLUCAP in the last 168 hours. Lipid Profile: No results for input(s): CHOL, HDL, LDLCALC, TRIG, CHOLHDL, LDLDIRECT in the last 72 hours. Thyroid Function Tests: No results for input(s): TSH, T4TOTAL, FREET4, T3FREE, THYROIDAB in the last 72 hours. Anemia Panel: No results for input(s): VITAMINB12, FOLATE, FERRITIN, TIBC, IRON, RETICCTPCT in the last 72 hours. Urine analysis:    Component Value Date/Time   COLORURINE YELLOW 09/30/2016 1630   APPEARANCEUR HAZY (A) 09/30/2016 1630   LABSPEC 1.027 09/30/2016 1630   PHURINE 5.0 09/30/2016 1630   GLUCOSEU >=500 (A) 09/30/2016 1630   HGBUR NEGATIVE 09/30/2016 1630   BILIRUBINUR NEGATIVE 09/30/2016 1630   KETONESUR NEGATIVE 09/30/2016 1630   PROTEINUR NEGATIVE 09/30/2016 1630   NITRITE NEGATIVE 09/30/2016 1630   LEUKOCYTESUR NEGATIVE 09/30/2016 1630   Sepsis Labs: !!!!!!!!!!!!!!!!!!!!!!!!!!!!!!!!!!!!!!!!!!!! @LABRCNTIP (procalcitonin:4,lacticidven:4) )No results found for this or any previous visit (from the past 240 hour(s)).   Radiological Exams on Admission: No results found.  Old chart reviewed Case discussed with  EDP  Assessment/Plan 52 year old male with gluteal abscess Principal Problem:   Cellulitis and abscess of buttock-patient going to the OR with general surgery today general surgery obviously has been consulted.  Will place on IV vancomycin and Zosyn.  I discussed patient that he may need packing he is going to speak to his wife with whom he is separated to see if she will be able to do this at home when he is discharged.  Patient not toxic and stable.  Active Problems:   DM (diabetes mellitus) ([facility])-sliding scale insulin for now   Hyperlipidemia-stable   Essential hypertension, benign-stable    DVT prophylaxis: SCDs Code Status: Full Family Communication: None Disposition Plan: Per day team Consults called: General surgery Admission status: Observation   Laketa Sandoz A MD [facility]  If 7PM-7AM, please contact night-coverage [email or web address] Password [email or web address]  11/20/2017, 1:37 PM

## 2017-11-20 NOTE — Progress Notes (Signed)
A consult was received from an ED physician for [place] per pharmacy dosing.  The patient's profile has been reviewed for ht/wt/allergies/indication/available labs.   A one time order has been placed for Zosyn 3.375gm IV & Vancomycin 1gm IV.  Further antibiotics/pharmacy consults should be ordered by admitting physician if indicated.                       Thank you, Biagio Borg 11/20/2017  11:25 AM

## 2017-11-20 NOTE — ED Triage Notes (Signed)
Patient c/o left buttock abscess x 2 days. Patient has a low grade temp. patient also reports last BM 4 days ago. 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 Patient reports increased pain to the right leg.

## 2017-11-20 NOTE — Progress Notes (Signed)
Pharmacy Antibiotic Note  Tommy Jimenez is a 52 y.o. male admitted on 11/20/2017 with low grade temp and left buttock abscess x 2 days.  Pharmacy has been consulted for vancomycin and zosyn dosing for cellulitis.  Plan: -Vancomycin 1gm x 1 in ED then 1gm IV q12h (AUC 503.1, Scr 0.93) -Zosyn 3.375g over 30 minutes in ED then continue 3.375mg  IV Q8H infused over 4hrs. -Daily Scr -Follow renal function, cultures and clinical course  Height: 5\' 8"  (172.7 cm) Weight: 167 lb (75.8 kg) IBW/kg (Calculated) : 68.4  Temp (24hrs), Avg:100.6 F (38.1 C), Min:100 F (37.8 C), Max:101.1 F (38.4 C)  Recent Labs  Lab 11/20/17 1100 11/20/17 1122  WBC 15.7*  --   CREATININE 0.93  --   LATICACIDVEN  --  1.45    Estimated Creatinine Clearance: 91.9 mL/min (by C-G formula based on SCr of 0.93 mg/dL).    No Known Allergies  Antimicrobials this admission: 3/4 vanc >> 3/4 zosyn >>  Dose adjustments this admission:   Microbiology results: 3/4 BCx:    Thank you for allowing pharmacy to be a part of this patient's care.  Dolly Rias RPh 11/20/2017, 1:49 PM Pager [phone]

## 2017-11-20 NOTE — Progress Notes (Signed)
Patient has meds from home that need to be locked up in the pharmacy

## 2017-11-20 NOTE — Anesthesia Postprocedure Evaluation (Signed)
Anesthesia Post Note  Patient: Tommy Jimenez  Procedure(s) Performed: IRRIGATION AND DEBRIDEMENT PERIRECTAL ABSCESS (Left Buttocks)     Patient location during evaluation: PACU Anesthesia Type: General Level of consciousness: sedated Pain management: pain level controlled Vital Signs Assessment: post-procedure vital signs reviewed and stable Respiratory status: spontaneous breathing Cardiovascular status: stable Postop Assessment: no apparent nausea or vomiting Anesthetic complications: no    Last Vitals:  Vitals:   11/20/17 1715 11/20/17 1730  BP: (!) 162/84 (!) 152/87  Pulse: (!) 103 91  Resp: 18 17  Temp: 37.3 C   SpO2: 100% 100%    Last Pain:  Vitals:   11/20/17 1715  TempSrc:   PainSc: Asleep   Pain Goal:                 Brigette Hopfer JR,JOHN Doreather Hoxworth

## 2017-11-20 NOTE — ED Notes (Signed)
OR on their way to get patient from ED.

## 2017-11-20 NOTE — Consult Note (Signed)
[facility] Consult Note  Tommy Jimenez 1966-06-16  [id number].    Requesting MD: Billy Fischer Chief Complaint/Reason for Consult: gluteal abscess HPI:  Patient is a 52 year old male who presents with left gluteal pain and swelling. Has had a hard knot like mass in the same spot for years now without any issue, but over the last few days has experienced increased pain and swelling. No drainage has been expressed. Patient reports some fatigue and decreased appetite, also some constipation. Passing flatus. Denies fever, chills, chest pain, SOB, abdominal pain, n/v, urinary symptoms, numbness or tingling. PMH significant for T2DM on metformin and recent STEMI in September of 2018. Over the last 2 months he has not been checking his blood sugars at home because he ran out of test strips. Had stents placed at the time of his STEMI and has been on Brillinta, although he has not had a dose since Saturday morning. Patient with NKDA.   ROS: Review of Systems  Constitutional: Positive for malaise/fatigue. Negative for chills and fever.  Respiratory: Negative for cough and shortness of breath.   Cardiovascular: Negative for chest pain and palpitations.  Gastrointestinal: Positive for constipation. Negative for abdominal pain, blood in stool, diarrhea, melena, nausea and vomiting.  Genitourinary: Negative for dysuria, frequency and urgency.  Neurological: Negative for tingling and sensory change.  All other systems reviewed and are negative.   Family History  Problem Relation Age of Onset  . Diabetes Mother        complications of diabetes  . Heart disease Father        died of MI, died age 65yo  . Hypertension Father   . Multiple sclerosis Sister   . Cancer Brother        brain tumor  . Diabetes Brother   . Lupus Sister   . Lupus Sister   . Stroke Neg Hx     Past Medical History:  Diagnosis Date  . DM (diabetes mellitus) ([place]) 1996  . Erectile dysfunction   . HTN  (hypertension)     Past Surgical History:  Procedure Laterality Date  . CORONARY STENT PLACEMENT      Social History:  reports that he has quit smoking. His smoking use included cigarettes. He quit after 20.00 years of use. he has never used smokeless tobacco. He reports that he drinks alcohol. He reports that he does not use drugs.  Allergies: No Known Allergies   (Not in a hospital admission)  Blood pressure (!) 167/96, pulse (!) 106, temperature (!) 101.1 F (38.4 C), temperature source Oral, resp. rate (!) 23, height 5\' 8"  (1.727 m), weight 75.8 kg (167 lb), SpO2 100 %. Physical Exam: Physical Exam  Constitutional: He is oriented to person, place, and time. He appears well-developed and well-nourished. He is cooperative.  Non-toxic appearance. No distress.  HENT:  Head: Normocephalic and atraumatic.  Right Ear: External ear normal.  Left Ear: External ear normal.  Nose: Nose normal.  Mouth/Throat: Oropharynx is clear and moist and mucous membranes are normal.  Eyes: Conjunctivae, EOM and lids are normal. Pupils are equal, round, and reactive to light. No scleral icterus.  Neck: Normal range of motion and phonation normal. Neck supple.  Cardiovascular: Regular rhythm. Tachycardia present.  Pulses:      Radial pulses are 2+ on the right side, and 2+ on the left side.       Dorsalis pedis pulses are 2+ on the right side, and 2+ on the left side.  No  LE edema bilaterally   Pulmonary/Chest: Effort normal and breath sounds normal.  Abdominal: Soft. Bowel sounds are normal. He exhibits no distension and no mass. There is no hepatosplenomegaly. There is no tenderness.  Genitourinary: Testes normal and penis normal. Rectal exam shows mass and tenderness.  Genitourinary Comments: Area of induration and swelling in L gluteal cleft with erythema and tenderness to palpation, very small central area of fluctuance.   Musculoskeletal:  ROM grossly intact in bilateral upper and lower  extremities.   Neurological: He is alert and oriented to person, place, and time. He has normal strength. No sensory deficit.  Skin: Skin is warm, dry and intact. No rash noted.  Psychiatric: He has a normal mood and affect. His speech is normal and behavior is normal.    Results for orders placed or performed during the hospital encounter of 11/20/17 (from the past 48 hour(s))  I-Stat CG4 Lactic Acid, ED     Status: None   Collection Time: 11/20/17 11:22 AM  Result Value Ref Range   Lactic Acid, Venous 1.45 0.5 - 1.9 mmol/L   No results found.    Assessment/Plan HTN Hx of STEMI on Brilinta - hold until post-op T2DM   Left gluteal cellulitis/abscess - needs cards clearance, I have paged them - EDP did bed side US showing abscess ~4x4 cm  - NPO, IVF - IV abx - plan for I&D in the OR when cleared  FEN: NPO, IVF VTE: SCDs ID: [id number] 3/4>>  Brigid Re, [facility] 11/20/2017, 11:43 AM Pager: [phone] Consults: [phone]  Mon-Fri 7:00 am-4:30 pm Sat-Sun 7:00 am-11:30 am

## 2017-11-20 NOTE — Progress Notes (Signed)
Pts medications brought down to pharmacy to be inventoried and stored during pts surgery. Pts valuables locked up with security (wallet and phone).  Receipts from both given to PACU with security key.  Pt belongings including clothing placed in locker 13 in PACU. PACU staff aware.

## 2017-11-20 NOTE — Transfer of Care (Signed)
Immediate Anesthesia Transfer of Care Note  Patient: Tommy Jimenez  Procedure(s) Performed: IRRIGATION AND DEBRIDEMENT PERIRECTAL ABSCESS (Left Buttocks)  Patient Location: PACU  Anesthesia Type:General  Level of Consciousness: awake, alert , oriented and patient cooperative  Airway & Oxygen Therapy: Patient Spontanous Breathing and Patient connected to face mask  Post-op Assessment: Report given to RN and Post -op Vital signs reviewed and stable  Post vital signs: Reviewed and stable  Last Vitals:  Vitals:   11/20/17 1430 11/20/17 1603  BP: (!) 148/89 (!) 148/95  Pulse: 95 89  Resp: (!) 27 16  Temp:  37.2 C  SpO2: 99% 98%    Last Pain:  Vitals:   11/20/17 1603  TempSrc: Oral  PainSc: 9          Complications: No apparent anesthesia complications

## 2017-11-20 NOTE — ED Provider Notes (Addendum)
[facility] Provider Note   CSN: [id number] Arrival date & time: 11/20/17  3244     History   Chief Complaint Chief Complaint  Patient presents with  . Abscess  . Fever  . Constipation  . Leg Pain    HPI Tommy Jimenez is a 52 y.o. male.  HPI   52 year old male with a history of diabetes, hyperlipidemia, and STEMI in September, presents with concern for left gluteal pain and swelling.  Patient reports that he previously noticed a cyst in his rectum, but last week on Monday, noted he was beginning to have pain and increasing swelling to the area.  Reports he has had increasing pain and swelling over the last week, worsening in the last 2 days.  He just read a train back from [place].  He also reported several months he has had right leg pain.  He reports a history of blood clot in the lobe for which she is on Brilinta, and reports that he had a blood clot in his leg that caused his heart attack, but reviewing outside records I do not see history of DVT, but rather do see history of coronary artery disease and STEMI.  He denies chest pain or shortness of breath. Reports he has had fatigue and decreased appetite over the last few days but was not aware of a fever until he arrived to the ED. He had a previous drainage of possible pilonidal cyst/abscess versus other gluteal abscess when he was a teenager. Reports his stool smells foul.  Didn't take brilinta yesterday given he knew he needed to come to hospital today.  Past Medical History:  Diagnosis Date  . Coronary artery disease    stent placed  . DM (diabetes mellitus) ([place]) 1996  . DVT (deep venous thrombosis) ([facility])    right leg   . Erectile dysfunction   . HTN (hypertension)   . Myocardial infarction ([place])   . Peripheral vascular disease [facility])     Patient Active Problem List   Diagnosis Date Noted  . Cellulitis and abscess of buttock 11/20/2017  . S/P coronary artery stent placement   . DM (diabetes  mellitus) ([place]) 11/29/2013  . Hyperlipidemia 11/29/2013  . Essential hypertension, benign 11/29/2013  . Erectile dysfunction 11/29/2013    Past Surgical History:  Procedure Laterality Date  . CORONARY STENT PLACEMENT         Home Medications    Prior to Admission medications   Medication Sig Start Date End Date Taking? Authorizing Provider  aspirin EC 81 MG tablet Take 81 mg by mouth daily.   Yes [provider]  atorvastatin (LIPITOR) 80 MG tablet Take 1 tablet (80 mg total) by mouth daily. 11/30/13  Yes Tysinger, Camelia Eng, PA-C  glipiZIDE (GLUCOTROL XL) 10 MG 24 hr tablet Take 10 mg by mouth daily with breakfast.   Yes [provider]  lisinopril (PRINIVIL,ZESTRIL) 5 MG tablet Take 5 mg by mouth daily.   Yes [provider]  metFORMIN (GLUCOPHAGE) 850 MG tablet Take 850 mg by mouth 2 (two) times daily with a meal.   Yes [provider]  metoprolol tartrate (LOPRESSOR) 25 MG tablet Take 37.5 mg by mouth 2 (two) times daily.   Yes [provider]  ticagrelor (BRILINTA) 90 MG TABS tablet Take 90 mg by mouth 2 (two) times daily.   Yes [provider]  amoxicillin-clavulanate (AUGMENTIN) 875-125 MG tablet Take 1 tablet by mouth every 12 (twelve) hours. Patient not taking:  Reported on 11/20/2017 09/30/16   Joy, Helane Gunther, PA-C  Dulaglutide (TRULICITY) 1.5 MW/1.0UV SOPN Inject 1.5 mg into the skin once a week. Patient not taking: Reported on 09/30/2016 11/29/13   Tysinger, Camelia Eng, PA-C  Insulin Glargine (LANTUS) 100 UNIT/ML Solostar Pen Inject 0.4 Units into the skin daily at 10 pm. Patient not taking: Reported on 09/30/2016 11/29/13   Tysinger, Camelia Eng, PA-C  lisinopril (PRINIVIL,ZESTRIL) 10 MG tablet Take 1 tablet (10 mg total) by mouth daily. Patient not taking: Reported on 09/30/2016 11/29/13   Tysinger, Camelia Eng, PA-C  Saxagliptin-Metformin (KOMBIGLYZE XR) 01-999 MG TB24 Take 1 tablet by mouth daily. Patient not taking: Reported on 09/30/2016  11/29/13   Tysinger, Camelia Eng, PA-C  sildenafil (VIAGRA) 50 MG tablet Take 1 tablet (50 mg total) by mouth daily as needed for erectile dysfunction. 05/07/12 06/06/12  Rise Mu, PA-C  tadalafil (CIALIS) 10 MG tablet 1/2-1 tablet prn Patient not taking: Reported on 09/30/2016 11/29/13   Tysinger, Camelia Eng, PA-C    Family History Family History  Problem Relation Age of Onset  . Diabetes Mother        complications of diabetes  . Heart disease Father        died of MI, died age 39yo  . Hypertension Father   . Multiple sclerosis Sister   . Cancer Brother        brain tumor  . Diabetes Brother   . Lupus Sister   . Lupus Sister   . Stroke Neg Hx     Social History Social History   Tobacco Use  . Smoking status: Former Smoker    Years: 20.00    Types: Cigarettes  . Smokeless tobacco: Never Used  Substance Use Topics  . Alcohol use: Yes    Comment: socially - beer  . Drug use: No     Allergies   Patient has no known allergies.   Review of Systems Review of Systems  Constitutional: Positive for appetite change, fatigue and fever.  HENT: Negative for sore throat.   Eyes: Negative for visual disturbance.  Respiratory: Negative for shortness of breath.   Cardiovascular: Negative for chest pain.  Gastrointestinal: Positive for constipation. Negative for abdominal pain, diarrhea, nausea and vomiting.  Genitourinary: Negative for difficulty urinating.  Musculoskeletal: Positive for myalgias. Negative for back pain and neck stiffness.  Skin: Positive for rash and wound.  Neurological: Negative for syncope and headaches.     Physical Exam Updated Vital Signs BP (!) 148/95   Pulse 89   Temp 99 F (37.2 C) (Oral)   Resp 16   Ht 5\' 8"  (1.727 m)   Wt 75.8 kg (167 lb)   SpO2 98%   BMI 25.39 kg/m   Physical Exam  Constitutional: He is oriented to person, place, and time. He appears well-developed and well-nourished. No distress.  HENT:  Head: Normocephalic and  atraumatic.  Eyes: Conjunctivae and EOM are normal.  Neck: Normal range of motion.  Cardiovascular: Regular rhythm, normal heart sounds and intact distal pulses. Tachycardia present. Exam reveals no gallop and no friction rub.  No murmur heard. Pulmonary/Chest: Effort normal and breath sounds normal. No respiratory distress. He has no wheezes. He has no rales.  Abdominal: Soft. He exhibits no distension. There is no tenderness. There is no guarding.  Genitourinary:  Genitourinary Comments: Normal rectal exam, no sign of perirectal abscess  Musculoskeletal: He exhibits no edema.  Neurological: He is alert and oriented to person, place, and time.  Skin: Skin is warm and dry. Rash noted. He is not diaphoretic. There is erythema (9x5cm induration with central fluctuance left buttock with extension into gluteal cleft).  Nursing note and vitals reviewed.    ED Treatments / Results  Labs (all labs ordered are listed, but only abnormal results are displayed) Labs Reviewed  CBC WITH DIFFERENTIAL/PLATELET - Abnormal; Notable for the following components:      Result Value   WBC 15.7 (*)    Hemoglobin 12.7 (*)    HCT 37.8 (*)    Neutro Abs 10.8 (*)    Monocytes Absolute 2.2 (*)    All other components within normal limits  COMPREHENSIVE METABOLIC PANEL - Abnormal; Notable for the following components:   Sodium 133 (*)    Chloride 100 (*)    Glucose, Bld 252 (*)    Calcium 8.6 (*)    ALT 11 (*)    All other components within normal limits  CULTURE, BLOOD (ROUTINE X 2)  CULTURE, BLOOD (ROUTINE X 2)  ANAEROBIC CULTURE  AEROBIC CULTURE (SUPERFICIAL SPECIMEN)  HEMOGLOBIN A1C  CREATININE, SERUM  I-STAT CG4 LACTIC ACID, ED    EKG  EKG Interpretation  Date/Time:  Monday November 20 2017 11:39:08 EST Ventricular Rate:  108 PR Interval:    QRS Duration: 86 QT Interval:  347 QTC Calculation: 466 R Axis:   62 Text Interpretation:  Sinus tachycardia Anterior infarct, old No previous ECGs  available Confirmed by Gareth Morgan [phone]) on 11/20/2017 1:08:40 PM       Radiology No results found.  Procedures .Critical Care Performed by: Gareth Morgan, MD Authorized by: Gareth Morgan, MD   Critical care provider statement:    Critical care time (minutes):  30   Critical care was necessary to treat or prevent imminent or life-threatening deterioration of the following conditions:  Sepsis   Critical care was time spent personally by me on the following activities:  Re-evaluation of patient's condition, review of old charts, ordering and review of laboratory studies and discussions with consultants   (including critical care time)  Medications Ordered in ED Medications  lisinopril (PRINIVIL,ZESTRIL) tablet 5 mg ( Oral Automatically Held 11/28/17 1000)  metoprolol tartrate (LOPRESSOR) tablet 37.5 mg ( Oral Automatically Held 11/28/17 2200)  sodium chloride flush (NS) 0.9 % injection 3 mL ( Intravenous Automatically Held 11/28/17 2200)  sodium chloride flush (NS) 0.9 % injection 3 mL ( Intravenous MAR Hold 11/20/17 1624)  0.9 %  sodium chloride infusion ( Intravenous MAR Hold 11/20/17 1624)  insulin aspart (novoLOG) injection 0-9 Units ( Subcutaneous Automatically Held 11/28/17 1700)  acetaminophen (TYLENOL) tablet 650 mg ( Oral MAR Hold 11/20/17 1624)    Or  acetaminophen (TYLENOL) suppository 650 mg ( Rectal MAR Hold 11/20/17 1624)  traMADol (ULTRAM) tablet 100 mg ( Oral MAR Hold 11/20/17 1624)  vancomycin (VANCOCIN) IVPB 1000 mg/200 mL premix ( Intravenous Automatically Held 11/28/17 2000)  piperacillin-tazobactam (ZOSYN) IVPB 3.375 g ( Intravenous Automatically Held 11/28/17 2000)  lactated ringers infusion ( Intravenous Anesthesia Volume Adjustment 11/20/17 1656)  sodium chloride 0.9 % bolus 1,000 mL (0 mLs Intravenous Stopped 11/20/17 1341)  sodium chloride 0.9 % bolus 1,000 mL (0 mLs Intravenous Stopped 11/20/17 1342)  acetaminophen (TYLENOL) tablet 1,000 mg (1,000 mg Oral Given  11/20/17 1216)  morphine 4 MG/ML injection 4 mg (4 mg Intravenous Given 11/20/17 1138)  sodium chloride 0.9 % bolus 500 mL (0 mLs Intravenous Stopped 11/20/17 1342)  piperacillin-tazobactam (ZOSYN) IVPB 3.375 g (0 g Intravenous Stopped 11/20/17  1217)  vancomycin (VANCOCIN) IVPB 1000 mg/200 mL premix (0 mg Intravenous Stopped 11/20/17 1341)     Initial Impression / Assessment and Plan / ED Course  I have reviewed the triage vital signs and the nursing notes.  Pertinent labs & imaging results that were available during my care of the patient were reviewed by me and considered in my medical decision making (see chart for details).     52 year old male with a history of diabetes, hyperlipidemia, and STEMI in September, presents with concern for left gluteal pain and swelling.  Patient febrile to 100 and tachycardic to 120s on arrival to the ED.  Given RLE pain, DVT US done and negative.  Bedside US of gluteal abscess shows large abscess measuring approximately 4x4cm.  Do not detect perirectal involvement on exam. Given large size, consulted general surgery for drainage, discussed whether they would desire formal imaging, however feel comfortable given exam and beside evaluation to perform incision and drainage.   Given signs of sepsis with leukocytosis, tachycardia, infection source, given 2500cc NS-0 30cc/kg NS, vancomycin and zosyn.  VS improving, lactic acid WNL. Given multiple medical problems, hospitalist to admit.    Final Clinical Impressions(s) / ED Diagnoses   Final diagnoses:  Abscess  Cellulitis of buttock  Sepsis, due to unspecified organism [facility])    ED Discharge Orders    None       Gareth Morgan, MD 11/20/17 1702    Gareth Morgan, MD 12/18/17 1112

## 2017-11-20 NOTE — Consult Note (Signed)
Cardiology Consultation:   Patient ID: Tommy Jimenez; [id number]; 11/10/1965   Admit date: 11/20/2017 Date of Consult: 11/20/2017  Primary Care Provider: Carlena Hurl, PA-C Primary Cardiologist: Georgina Pillion, MD [facility])   Patient Profile:   Tommy Jimenez is a 52 y.o. male with a hx of CAD s/p STEMI, cardiac arrest, and diabetes who is being seen today for the evaluation of pre-operative risk assessment at the request of Brigid Re, PA-C.  History of Present Illness:   [name] presented to the hosptial 11/20/17 with a gluteal abscess.  He was travelling in [place] and developed a gluteal abscess.   He has been otherwise well and denies any chest pain or shortness of breath.  He does not get much formal exercise but is able to ambulate more than 4 blocks or up one flight of stairs without chest pain or shortness of breath.  He has not experienced any lower extremity edema, orthopnea, or PND.  He was evaluated by surgery and will require I&D in the OR.  Cardiology was consulted for preoperative risk assessment.    [name] had a STEMI 05/30/17.  He presented to [facility] with 100% occlusion of the LAD.  He had an out of hospital arrest.  Details of that hospitalization are limited.  He reports that it resulted from a blood clot in his leg.  Left heart catheterization revealed 10 was placed 0% ostial LAD.  A 3.5 mm x 12 mm Xience drug-eluting stent he had a follow-up echo 07/2017 with 0 residual stenosis.  There was no other identifiable CAD.  Left ventriculogram revealed LVEF 35%.  Echo that admission revealed LVEF 30-35% with anteroapical akinesis.  He had a follow up echo 06/2017 that reportedly showed an LVEF of 40%.  Results of this study are not currently available.  Of note, Mr. Shiner has not taken ticagrelo since the morning of 10/21/17.  He knew that he was coming to the hospital immediately after getting to [facility] and thought he should not take  it.  Outside of this episode he has not missed any doses of his medications.  Past Medical History:  Diagnosis Date  . DM (diabetes mellitus) ([place]) 1996  . Erectile dysfunction   . HTN (hypertension)     Past Surgical History:  Procedure Laterality Date  . CORONARY STENT PLACEMENT       Home Medications:  Prior to Admission medications   Medication Sig Start Date End Date Taking? Authorizing Provider  aspirin EC 81 MG tablet Take 81 mg by mouth daily.   Yes [provider]  atorvastatin (LIPITOR) 80 MG tablet Take 1 tablet (80 mg total) by mouth daily. 11/30/13  Yes Tysinger, Camelia Eng, PA-C  glipiZIDE (GLUCOTROL XL) 10 MG 24 hr tablet Take 10 mg by mouth daily with breakfast.   Yes [provider]  lisinopril (PRINIVIL,ZESTRIL) 5 MG tablet Take 5 mg by mouth daily.   Yes [provider]  metFORMIN (GLUCOPHAGE) 850 MG tablet Take 850 mg by mouth 2 (two) times daily with a meal.   Yes [provider]  metoprolol tartrate (LOPRESSOR) 25 MG tablet Take 37.5 mg by mouth 2 (two) times daily.   Yes [provider]  ticagrelor (BRILINTA) 90 MG TABS tablet Take 90 mg by mouth 2 (two) times daily.   Yes [provider]  amoxicillin-clavulanate (AUGMENTIN) 875-125 MG tablet Take 1 tablet by mouth every 12 (twelve) hours. Patient not taking: Reported on 11/20/2017 09/30/16  Joy, Shawn C, PA-C  Dulaglutide (TRULICITY) 1.5 YW/7.3XT SOPN Inject 1.5 mg into the skin once a week. Patient not taking: Reported on 09/30/2016 11/29/13   Tysinger, Camelia Eng, PA-C  Insulin Glargine (LANTUS) 100 UNIT/ML Solostar Pen Inject 0.4 Units into the skin daily at 10 pm. Patient not taking: Reported on 09/30/2016 11/29/13   Tysinger, Camelia Eng, PA-C  lisinopril (PRINIVIL,ZESTRIL) 10 MG tablet Take 1 tablet (10 mg total) by mouth daily. Patient not taking: Reported on 09/30/2016 11/29/13   Tysinger, Camelia Eng, PA-C  Saxagliptin-Metformin (KOMBIGLYZE XR) 01-999 MG TB24 Take 1  tablet by mouth daily. Patient not taking: Reported on 09/30/2016 11/29/13   Tysinger, Camelia Eng, PA-C  sildenafil (VIAGRA) 50 MG tablet Take 1 tablet (50 mg total) by mouth daily as needed for erectile dysfunction. 05/07/12 06/06/12  Rise Mu, PA-C  tadalafil (CIALIS) 10 MG tablet 1/2-1 tablet prn Patient not taking: Reported on 09/30/2016 11/29/13   Carlena Hurl, PA-C    Inpatient Medications: Scheduled Meds: . insulin aspart  0-9 Units Subcutaneous TID WC  . lisinopril  5 mg Oral Daily  . metoprolol tartrate  37.5 mg Oral BID  . sodium chloride flush  3 mL Intravenous Q12H   Continuous Infusions: . sodium chloride    . piperacillin-tazobactam (ZOSYN)  IV    . vancomycin     PRN Meds: sodium chloride, acetaminophen **OR** acetaminophen, sodium chloride flush, traMADol  Allergies:   No Known Allergies  Social History:   Social History   Socioeconomic History  . Marital status: Married    Spouse name: Not on file  . Number of children: Not on file  . Years of education: Not on file  . Highest education level: Not on file  Social Needs  . Financial resource strain: Not on file  . Food insecurity - worry: Not on file  . Food insecurity - inability: Not on file  . Transportation needs - medical: Not on file  . Transportation needs - non-medical: Not on file  Occupational History  . Not on file  Tobacco Use  . Smoking status: Former Smoker    Years: 20.00    Types: Cigarettes  . Smokeless tobacco: Never Used  Substance and Sexual Activity  . Alcohol use: Yes    Comment: socially - beer  . Drug use: No  . Sexual activity: Not on file  Other Topics Concern  . Not on file  Social History Narrative  . Not on file    Family History:   Outside of this episode he has not missed any doses of his medications. Family History  Problem Relation Age of Onset  . Diabetes Mother        complications of diabetes  . Heart disease Father        died of MI, died age 32yo  .  Hypertension Father   . Multiple sclerosis Sister   . Cancer Brother        brain tumor  . Diabetes Brother   . Lupus Sister   . Lupus Sister   . Stroke Neg Hx      ROS:  Please see the history of present illness.   All other ROS reviewed and negative.     Physical Exam/Data:   Vitals:   11/20/17 1135 11/20/17 1137 11/20/17 1139 11/20/17 1200  BP: (!) 167/96   (!) 164/92  Pulse: (!) 106   (!) 107  Resp: (!) 23   13  Temp:  Marland Kitchen)  101.1 F (38.4 C)    TempSrc:  Oral    SpO2: 100%   100%  Weight:   167 lb (75.8 kg)   Height:   5\' 8"  (1.727 m)    No intake or output data in the 24 hours ending 11/20/17 1417 Filed Weights   11/20/17 0832 11/20/17 1139  Weight: 169 lb (76.7 kg) 167 lb (75.8 kg)   VS:  BP (!) 148/89   Pulse 95   Temp (!) 101.1 F (38.4 C) (Oral)   Resp (!) 27   Ht 5\' 8"  (1.727 m)   Wt 167 lb (75.8 kg)   SpO2 99%   BMI 25.39 kg/m  , BMI Body mass index is 25.39 kg/m. GENERAL:  Well appearing.  No acute distress. HEENT: Pupils equal round and reactive, fundi not visualized, oral mucosa unremarkable NECK:  No jugular venous distention, waveform within normal limits, carotid upstroke brisk and symmetric, no bruits LUNGS:  Clear to auscultation bilaterally HEART:  RRR.  PMI not displaced or sustained,S1 and S2 within normal limits, no S3, no S4, no clicks, no rubs, no murmurs ABD:  Flat, positive bowel sounds normal in frequency in pitch, no bruits, no rebound, no guarding, no midline pulsatile mass, no hepatomegaly, no splenomegaly EXT:  2 plus pulses throughout, no edema, no cyanosis no clubbing SKIN:  No rashes no nodules NEURO:  Cranial nerves II through XII grossly intact, motor grossly intact throughout PSYCH:  Cognitively intact, oriented to person place and time   EKG:  The EKG was personally reviewed and demonstrates:  Sinus tachycardia rate 108 bpm.  Prior anterior infarction.    Telemetry:  Telemetry was personally reviewed and demonstrates:   Sinus rhythm.  PVCs.  Relevant CV Studies:  LHC 05/30/17:  100% ostial LAD stenosis.  0% residual stenosis after stent placement. LVEF 30-35%  Laboratory Data:  Chemistry Recent Labs  Lab 11/20/17 1100  NA 133*  K 4.5  CL 100*  CO2 24  GLUCOSE 252*  BUN 10  CREATININE 0.93  CALCIUM 8.6*  GFRNONAA >60  GFRAA >60  ANIONGAP 9    Recent Labs  Lab 11/20/17 1100  PROT 7.6  ALBUMIN 3.6  AST 26  ALT 11*  ALKPHOS 90  BILITOT 1.1   Hematology Recent Labs  Lab 11/20/17 1100  WBC 15.7*  RBC 4.43  HGB 12.7*  HCT 37.8*  MCV 85.3  MCH 28.7  MCHC 33.6  RDW 12.7  PLT 371   Cardiac EnzymesNo results for input(s): TROPONINI in the last 168 hours. No results for input(s): TROPIPOC in the last 168 hours.  BNPNo results for input(s): BNP, PROBNP in the last 168 hours.  DDimer No results for input(s): DDIMER in the last 168 hours.  Radiology/Studies:  No results found.  Assessment and Plan:   # Pre-op risk assessment:  The patient does not have any unstable cardiac conditions.  Upon evaluation today, he can achieve 4 METs or greater without anginal symptoms.  According to [facility] and AHA guidelines, he requires no further cardiac workup prior to his noncardiac surgery and should be at acceptable risk.  his NSQIP risk of peri-procedural MI or cardiac arrest is 0.3%.  Our service is available as necessary in the perioperative period.  # CAD s/p STEMI:  Mr. Wolke had a STEMI with cardiac arrest 6 months ago.  He had no other identifiable CAD on cath.  Mr. Blumenfeld was advised not to miss any doses of ticagrelor in the future unless  explicitly instructed by his MD given that he is going for surgery imminently it is reasonable to hold his dose for now and give him his evening dose if it at all possible.  If not, would strongly advise resuming in the morning.  # Chronic systolic and diastolic heart failure:  Patient is euvolemic.  Continue home metoprolol and lisinopril.  By report  his LVEF is 40%.   For questions or updates, please contact [place] Please consult [email or web address] for contact info under Cardiology/STEMI.   Signed, Skeet Latch, MD  11/20/2017 2:17 PM

## 2017-11-20 NOTE — Anesthesia Procedure Notes (Signed)
Procedure Name: LMA Insertion Date/Time: 11/20/2017 4:29 PM Performed by: Claudia Desanctis, CRNA Pre-anesthesia Checklist: Emergency Drugs available, Patient identified, Suction available and Patient being monitored Patient Re-evaluated:Patient Re-evaluated prior to induction Oxygen Delivery Method: Circle system utilized Preoxygenation: Pre-oxygenation with 100% oxygen Induction Type: IV induction Ventilation: Mask ventilation without difficulty LMA: LMA inserted LMA Size: 4.0 Number of attempts: 1 Placement Confirmation: positive ETCO2 and breath sounds checked- equal and bilateral Tube secured with: Tape Dental Injury: Teeth and Oropharynx as per pre-operative assessment

## 2017-11-21 ENCOUNTER — Encounter (HOSPITAL_COMMUNITY): Payer: Self-pay | Admitting: General Surgery

## 2017-11-21 DIAGNOSIS — E11628 Type 2 diabetes mellitus with other skin complications: Secondary | ICD-10-CM

## 2017-11-21 DIAGNOSIS — L0291 Cutaneous abscess, unspecified: Secondary | ICD-10-CM

## 2017-11-21 LAB — GLUCOSE, CAPILLARY
GLUCOSE-CAPILLARY: 208 mg/dL — AB (ref 65–99)
Glucose-Capillary: 201 mg/dL — ABNORMAL HIGH (ref 65–99)
Glucose-Capillary: 267 mg/dL — ABNORMAL HIGH (ref 65–99)
Glucose-Capillary: 299 mg/dL — ABNORMAL HIGH (ref 65–99)
Glucose-Capillary: 302 mg/dL — ABNORMAL HIGH (ref 65–99)

## 2017-11-21 LAB — CREATININE, SERUM
CREATININE: 0.76 mg/dL (ref 0.61–1.24)
GFR calc Af Amer: >60 mL/min (ref >60)
GFR calc non Af Amer: >60 mL/min (ref >60)

## 2017-11-21 LAB — CBC
HCT: 32.9 % — ABNORMAL LOW (ref 39.0–52.0)
Hemoglobin: 10.7 g/dL — ABNORMAL LOW (ref 13.0–17.0)
MCH: 28.2 pg (ref 26.0–34.0)
MCHC: 32.5 g/dL (ref 30.0–36.0)
MCV: 86.6 fL (ref 78.0–100.0)
PLATELETS: 345 10*3/uL (ref 150–400)
RBC: 3.8 MIL/uL — ABNORMAL LOW (ref 4.22–5.81)
RDW: 12.7 % (ref 11.5–15.5)
WBC: 15.3 10*3/uL — ABNORMAL HIGH (ref 4.0–10.5)

## 2017-11-21 MED ORDER — TICAGRELOR 90 MG PO TABS
90.0000 mg | ORAL_TABLET | Freq: Two times a day (BID) | ORAL | Status: DC
  Administered 2017-11-21 – 2017-11-22 (×3): 90 mg via ORAL
  Filled 2017-11-21 (×3): qty 1

## 2017-11-21 MED ORDER — POLYETHYLENE GLYCOL 3350 17 G PO PACK
17.0000 g | PACK | Freq: Every day | ORAL | Status: DC
  Administered 2017-11-21 – 2017-11-22 (×2): 17 g via ORAL
  Filled 2017-11-21 (×2): qty 1

## 2017-11-21 MED ORDER — DOCUSATE SODIUM 100 MG PO CAPS
100.0000 mg | ORAL_CAPSULE | Freq: Two times a day (BID) | ORAL | Status: DC
  Administered 2017-11-21 – 2017-11-22 (×3): 100 mg via ORAL
  Filled 2017-11-21 (×2): qty 1

## 2017-11-21 NOTE — Plan of Care (Signed)
Plan of care reviewed and discussed with patient.   

## 2017-11-21 NOTE — Progress Notes (Signed)
[facility] Progress Note  1 Day Post-Op  Subjective: CC: pain in L buttock Patient with some mild pain in left buttock, improved from yesterday prior to OR. Tolerating diet, passing flatus, no n/v. No abdominal pain.  UOP good. VSS.   Objective: Vital signs in last 24 hours: Temp:  [98.7 F (37.1 C)-101.1 F (38.4 C)] 99.1 F (37.3 C) (03/05 0543) Pulse Rate:  [79-124] 79 (03/05 0543) Resp:  [10-27] 16 (03/05 0543) BP: (126-174)/(64-106) 126/64 (03/05 0543) SpO2:  [95 %-100 %] 100 % (03/05 0543) Weight:  [75.8 kg (167 lb)-76.7 kg (169 lb)] 75.8 kg (167 lb) (03/04 1829) Last BM Date: 11/16/17  Intake/Output from previous day: 03/04 0701 - 03/05 0700 In: 2730 [P.O.:580; I.V.:1900; IV Piggyback:250] Out: 2060 [Urine:2050; Blood:10] Intake/Output this shift: Total I/O In: -  Out: 550 [Urine:550]  PE: Gen:  Alert, NAD, pleasant Card:  Regular rate and rhythm, pedal pulses 2+ BL Pulm:  Normal effort, clear to auscultation bilaterally Abd: Soft, non-tender, non-distended, bowel sounds present GU: packing removed from L buttock incision with minimal purulent drainage, surrounding erythema and induration improving Skin: warm and dry, no rashes  Psych: A&Ox3   Lab Results:  Recent Labs    11/20/17 1100  WBC 15.7*  HGB 12.7*  HCT 37.8*  PLT 371   BMET Recent Labs    11/20/17 1100 11/20/17 1619 11/21/17 0548  NA 133*  --   --   K 4.5  --   --   CL 100*  --   --   CO2 24  --   --   GLUCOSE 252*  --   --   BUN 10  --   --   CREATININE 0.93 0.78 0.76  CALCIUM 8.6*  --   --    PT/INR No results for input(s): LABPROT, INR in the last 72 hours. CMP     Component Value Date/Time   NA 133 (L) 11/20/2017 1100   K 4.5 11/20/2017 1100   CL 100 (L) 11/20/2017 1100   CO2 24 11/20/2017 1100   GLUCOSE 252 (H) 11/20/2017 1100   BUN 10 11/20/2017 1100   CREATININE 0.76 11/21/2017 0548   CREATININE 0.93 07/26/2013 1013   CALCIUM 8.6 (L) 11/20/2017 1100   PROT 7.6 11/20/2017 1100   ALBUMIN 3.6 11/20/2017 1100   AST 26 11/20/2017 1100   ALT 11 (L) 11/20/2017 1100   ALKPHOS 90 11/20/2017 1100   BILITOT 1.1 11/20/2017 1100   GFRNONAA >60 11/21/2017 0548   GFRAA >60 11/21/2017 0548   Lipase  No results found for: LIPASE     Studies/Results: No results found.  Anti-infectives: Anti-infectives (From admission, onward)   Start     Dose/Rate Route Frequency Ordered Stop   11/20/17 2000  vancomycin (VANCOCIN) IVPB 1000 mg/200 mL premix     1,000 mg 200 mL/hr over 60 Minutes Intravenous Every 12 hours 11/20/17 1356     11/20/17 2000  piperacillin-tazobactam (ZOSYN) IVPB 3.375 g     3.375 g 12.5 mL/hr over 240 Minutes Intravenous Every 8 hours 11/20/17 1356     11/20/17 1400  piperacillin-tazobactam (ZOSYN) IVPB 3.375 g  Status:  Discontinued     3.375 g 100 mL/hr over 30 Minutes Intravenous Every 8 hours 11/20/17 1355 11/20/17 1356   11/20/17 1400  vancomycin (VANCOCIN) IVPB 1000 mg/200 mL premix  Status:  Discontinued     1,000 mg 200 mL/hr over 60 Minutes Intravenous Every 24 hours 11/20/17 1355 11/20/17 1356  11/20/17 1130  piperacillin-tazobactam (ZOSYN) IVPB 3.375 g     3.375 g 100 mL/hr over 30 Minutes Intravenous  Once 11/20/17 1117 11/20/17 1217   11/20/17 1130  vancomycin (VANCOCIN) IVPB 1000 mg/200 mL premix     1,000 mg 200 mL/hr over 60 Minutes Intravenous  Once 11/20/17 1117 11/20/17 1341       Assessment/Plan HTN Hx of STEMI on Brilinta - restarted 90 mg BID T2DM   Left gluteal cellulitis/abscess S/p I&D 11/20/17 Dr. Barry Dienes  - POD#1 - cbc ordered, afebrile, not tachycardic - cx pending - sitz baths 3-4x daily and prn if soiled  FEN: hh/cm diet; added bowel regimen VTE: SCDs ID: [id number] 3/4>>  Ok to discharge patient later today vs tomorrow AM per surgery on 7d PO abx and sitz baths. Wound does not need to be repacked. Patient may follow up in [place] clinic, I will schedule him an appointment.   LOS: 0  days    Brigid Re , [facility] Surgery 11/21/2017, 8:18 AM Pager: [phone] Consults: [phone]  Mon-Fri 7:00 am-4:30 pm Sat-Sun 7:00 am-11:30 am

## 2017-11-21 NOTE — Discharge Instructions (Signed)
Perirectal Abscess An abscess is an infected area that contains a collection of pus. A perirectal abscess is an abscess that is near the opening of the anus or around the rectum. A perirectal abscess can cause a lot of pain, especially during bowel movements. What are the causes? This condition is almost always caused by an infection that starts in an anal gland. What increases the risk? This condition is more likely to develop in:  People with diabetes or inflammatory bowel disease.  People whose body defense system (immune system) is weak.  People who have anal sex.  People who have a sexually transmitted disease (STD).  People who have certain kinds of cancers, such as rectal carcinoma, leukemia, or lymphoma.  What are the signs or symptoms? The main symptom of this condition is pain. The pain may be a throbbing pain that gets worse during bowel movements. Other symptoms include:  Fever.  Swelling.  Redness.  Bleeding.  Constipation.  How is this diagnosed? The condition is diagnosed with a physical exam. If the abscess is not visible, a health care provider may need to place a finger inside the rectum to find the abscess. Sometimes, imaging tests are done to determine the size and location of the abscess. These tests may include:  An ultrasound.  An MRI.  A CT scan.  How is this treated? This condition is usually treated with incision and drainage surgery. Incision and drainage surgery involves making an incision over the abscess to drain the pus. Treatment may also involve antibiotic medicine, pain medicine, stool softeners, or laxatives. Follow these instructions at home:  Take medicines only as directed by your health care provider.  If you were prescribed an antibiotic, finish all of it even if you start to feel better.  To relieve pain, try sitting: ? In a warm, shallow bath (sitz bath). ? On a heating pad with the setting on low. ? On an inflatable  donut-shaped cushion.  Follow any diet instructions as directed by your health care provider.  Keep all follow-up visits as directed by your health care provider. This is important. Contact a health care provider if:  Your abscess is bleeding.  You have pain, swelling, or redness that is getting worse.  You are constipated.  You feel ill.  You have muscle aches or chills.  You have a fever.  Your symptoms return after the abscess has healed. This information is not intended to replace advice given to you by your health care provider. Make sure you discuss any questions you have with your health care provider. Document Released: 09/02/2000 Document Revised: 02/11/2016 Document Reviewed: 07/16/2014 Elsevier Interactive Patient Education  2018 Elsevier Inc.  

## 2017-11-21 NOTE — Plan of Care (Signed)
Care plan 1149 11/21/2017 Thereasa Distance, RN

## 2017-11-21 NOTE — Progress Notes (Signed)
PROGRESS NOTE    Tommy Jimenez  KLK:[id number] DOB: Jun 10, 1966 DOA: 11/20/2017 PCP: Carlena Hurl, PA-C    Brief Narrative:  52 year old male who presented with pain and edema at the gluteal region.  Patient does have a significant past medical history of type 2 diabetes mellitus and hypertension.  He presented with one week history of painful edema of the left gluteal region, associated with induration.  Due to worsening symptoms he presented to the emergency department.  On the initial physical examination blood pressure 167/96, heart rate 106, respiratory 3, temperature 101.1, oxygen saturation 100%, moist mucous membranes, lungs clear to auscultation bilaterally, heart S1-S2 present rhythmic, the abdomen is soft nontender, no lower extremity edema.  Left gluteal 3 cm area of induration with fluctuance and surrounding erythema.  Sodium 133, potassium 4.5, chloride 100, bicarb 24, glucose 252, BUN 10, creatinine 0.93, white count 15.7, hemoglobin 12.7, hematocrit 37.8, platelets 371.  Lower extremity Doppler ultrasonography negative for deep vein thrombosis.  EKG, sinus rhythm, normal axis, normal intervals, septal V1 to V3 Q waves.   Patient was admitted to the hospital with a working diagnosis of sepsis due to gluteal abscess.    Assessment & Plan:   Principal Problem:   Cellulitis and abscess of buttock Active Problems:   DM (diabetes mellitus) ([place])   Hyperlipidemia   Essential hypertension, benign   1.  Sepsis due to gluteal abscess. Clinically has improved after I&D, will follow on blood cultures and wound cultures. Continue IV Vancomycin and IV Zosyn for now, follow on cell count and temperature curve. Continue local wound care.   2.  Uncontrolled type 2 diabetes mellitus. Will continue glucose cover and monitoring with sliding scale, capillary glucose 208, 208, 265, 201, 267. Patient tolerating po well.   3.  Hypertension. Will continue blood pressure control with lisinopril  5 mg and metoprolol.   4. CAD. Stable, continue ticagrelor, metoprolol and lisinopril.   DVT prophylaxis: enoxaparin  Code Status:  full Family Communication: no family at the bedside Disposition Plan:  Home when resulted cultures, in 24 or 48 hours.    Consultants:   Surgery  Procedures:   I&D  Antimicrobials:   Vancomycin  Zosyn    Subjective: Patient is feeling better, gluteal pain has improved, no nausea or vomiting, tolerating po well.   Objective: Vitals:   11/20/17 2116 11/21/17 0200 11/21/17 0543 11/21/17 0954  BP: 139/79 132/73 126/64 137/69  Pulse: 85 88 79 81  Resp: 16 16 16 16   Temp: 99.2 F (37.3 C) 98.9 F (37.2 C) 99.1 F (37.3 C) 98.4 F (36.9 C)  TempSrc: Oral Oral Oral Oral  SpO2: 100% 100% 100% 99%  Weight:      Height:        Intake/Output Summary (Last 24 hours) at 11/21/2017 1220 Last data filed at 11/21/2017 1105 Gross per 24 hour  Intake 3477.5 ml  Output 3010 ml  Net 467.5 ml   Filed Weights   11/20/17 0832 11/20/17 1139 11/20/17 1829  Weight: 76.7 kg (169 lb) 75.8 kg (167 lb) 75.8 kg (167 lb)    Examination:   General: Not in pain or dyspnea.  Neurology: Awake and alert, non focal  E ENT: no pallor, no icterus, oral mucosa moist Cardiovascular: No JVD. S1-S2 present, rhythmic, no gallops, rubs, or murmurs. No lower extremity edema. Pulmonary: vesicular breath sounds bilaterally, adequate air movement, no wheezing, rhonchi or rales. Gastrointestinal. Abdomen flat, no organomegaly, non tender, no rebound or guarding Skin. Large  right medial gluteal deep wound, irregular about 4 cm length and 3 cm wide, blood drainage, no purulence or erythema.  Musculoskeletal: no joint deformities     Data Reviewed: I have personally reviewed following labs and imaging studies  CBC: Recent Labs  Lab 11/20/17 1100 11/21/17 0543  WBC 15.7* 15.3*  NEUTROABS 10.8*  --   HGB 12.7* 10.7*  HCT 37.8* 32.9*  MCV 85.3 86.6  PLT 371 161    Basic Metabolic Panel: Recent Labs  Lab 11/20/17 1100 11/20/17 1619 11/21/17 0548  NA 133*  --   --   K 4.5  --   --   CL 100*  --   --   CO2 24  --   --   GLUCOSE 252*  --   --   BUN 10  --   --   CREATININE 0.93 0.78 0.76  CALCIUM 8.6*  --   --    GFR: Estimated Creatinine Clearance: 106.9 mL/min (by C-G formula based on SCr of 0.76 mg/dL). Liver Function Tests: Recent Labs  Lab 11/20/17 1100  AST 26  ALT 11*  ALKPHOS 90  BILITOT 1.1  PROT 7.6  ALBUMIN 3.6   No results for input(s): LIPASE, AMYLASE in the last 168 hours. No results for input(s): AMMONIA in the last 168 hours. Coagulation Profile: No results for input(s): INR, PROTIME in the last 168 hours. Cardiac Enzymes: No results for input(s): CKTOTAL, CKMB, CKMBINDEX, TROPONINI in the last 168 hours. BNP (last 3 results) No results for input(s): PROBNP in the last 8760 hours. HbA1C: Recent Labs    11/20/17 1605  HGBA1C 11.6*   CBG: Recent Labs  Lab 11/20/17 1609 11/20/17 1737 11/20/17 2207 11/21/17 0740 11/21/17 1204  GLUCAP 208* 208* 265* 201* 267*   Lipid Profile: No results for input(s): CHOL, HDL, LDLCALC, TRIG, CHOLHDL, LDLDIRECT in the last 72 hours. Thyroid Function Tests: No results for input(s): TSH, T4TOTAL, FREET4, T3FREE, THYROIDAB in the last 72 hours. Anemia Panel: No results for input(s): VITAMINB12, FOLATE, FERRITIN, TIBC, IRON, RETICCTPCT in the last 72 hours.    Radiology Studies: I have reviewed all of the imaging during this hospital visit personally     Scheduled Meds: . docusate sodium  100 mg Oral BID  . insulin aspart  0-9 Units Subcutaneous TID WC  . lisinopril  5 mg Oral Daily  . metoprolol tartrate  37.5 mg Oral BID  . polyethylene glycol  17 g Oral Daily  . sodium chloride flush  3 mL Intravenous Q12H  . ticagrelor  90 mg Oral BID   Continuous Infusions: . sodium chloride 250 mL (11/21/17 0700)  . piperacillin-tazobactam (ZOSYN)  IV 3.375 g (11/21/17  1211)  . vancomycin Stopped (11/21/17 0959)     LOS: 0 days        Tawni Millers, MD [facility] Pager [phone]

## 2017-11-21 NOTE — Progress Notes (Addendum)
Progress Note   Tommy Jimenez ZHY:[id number] DOB: 1966-07-05 DOA: 11/20/2017 PCP: Carlena Hurl, PA-C   LOS: 0 days    Brief Narrative:  Tommy Jimenez is a 52 year old male with a medical history significant for DM type II, hyperlipidemia, STEMI in September, and history of blood clots who presented to the ED on 11/20/2017 with gluteal pain and swelling that has progressively gotten worse over the past 5 days. Patient has had a rectum cyst for about 4 years that has not caused him problem except for 5 days ago. Upon presentation to the ED, patient had a temperature of 100, HR 124, RR 22, BP 160/86. Initial labs demonstrated sodium 133, chloride 100, glucose 252, calcium 8.6, ALT 15.7, Hgb 12.7, HCT 37.9, neut # 10.8, monocytes 2.2. Bedside ultrasound in the ED demonstrated 4cm x 4cm abscess. General surgery was consulted for surgical drainage.  Patient was admitted on the working diagnosis of sepsis due to left gluteal abscess with surrounding cellulitis.    Assessment/Plan:   Principal Problem:   Cellulitis and abscess of buttock Active Problems:   DM (diabetes mellitus) ([place])   Hyperlipidemia   Essential hypertension, benign  1. Sepsis due to left gluteal abscess with cellulitis: Patient is currently afebrile with improvement of symptoms. He complains of mild buttocks pain. Abscess was I&D in OR with general surgery yesterday evening. Abscess was found to have purulent drainage. Continue current abx treatment of zosyn and vancomycin. Change antimicrobial therapy based on wound culture results. Consult wound care for further evaluation. Continue conservative measures of Sitz bath 3-4x daily and IVFs. Wound does not appear infected. No purulent discharge, just drainage of blood from wound site. Continue to change bandage frequently. Discontinue wound packing per general surgery. CBC ordered to check for trend of WBC.   2. Uncontrolled diabetes mellitus type II with hyperglycemia:  Patient is currently on metformin at home, but admits to lack of PCP follow-up due to lack of insurance and relocation to [place] since November. A1c 11.6. Start insulin treatment of Novolog sliding scale based on CBG. Continue to monitor glucose levels. Must maintain glucose control to allow for proper wound healing. Encourage patient to follow-up with PCP in [place] for strict glucose control.  3. History of STEMI in September 2019: Patient admits to having a STEMI in September due to DVT of right leg. Restart Brilinta 90 mg po BID. Patient not currently experiencing chest pain or shortness of breath. Upon ED presentation, patient was having mild pain if right lower calf. Vascular ultrasound was performed and showed no evidence of DVT, superficial venous thrombosis, or any abnormalities bilaterally. Patient described leg pain as shooting pain that wakes him up in the middle of the night. Most likely due to uncontrolled DM type II. Continue SCDs bilaterally. Allow patient to get out of bed and walk around.  4. Hypertension: SBP 120-130s. Continue Lopressor and Lisinopril.     Family Communication/Anticipated D/C date and plan/Code Status   DVT prophylaxis: Brilinta & SCDs Code Status: Full Family Communication: No family at bedside Disposition Plan: home   Medical Consultants:  General Surgery     Antimicrobials:  Zosyn (2) Vancomycin (2)   Subjective:  Patient is awake and alert. He is feeling much better today with mild pain in his buttock region. He has not had a bowel movement since Thursday when he started experiencing his pain. He denies fever, chills, vomiting, SOB, and chest pain.   Objective:   Vitals:  December 13, 2017 2116 11/21/17 0200 11/21/17 0543 11/21/17 0954  BP: 139/79 132/73 126/64 137/69  Pulse: 85 88 79 81  Resp: 16 16 16    Temp: 99.2 F (37.3 C) 98.9 F (37.2 C) 99.1 F (37.3 C)   TempSrc: Oral Oral Oral   SpO2: 100% 100% 100% 99%  Weight:        Height:        Intake/Output Summary (Last 24 hours) at 11/21/2017 0958 Last data filed at 11/21/2017 0835 Gross per 24 hour  Intake 3090 ml  Output 2610 ml  Net 480 ml   Filed Weights   12-13-2017 0832 12-13-17 1139 2017-12-13 1829  Weight: 76.7 kg (169 lb) 75.8 kg (167 lb) 75.8 kg (167 lb)     Physical Exam:   Constitutional: NAD, well-nourished Eyes: lids and conjunctivae normal, no icterus ENMT: Mucous membranes are moist.  Neck: normal, supple, no masses Respiratory: clear to auscultation bilaterally, no wheezing, no crackles. Normal respiratory effort. No accessory muscle use.  Cardiovascular: Regular rate and rhythm, normal S1-S2, no murmurs / rubs / gallops. No LE edema. Negative homan sign bilaterally.  Abdomen: soft, nontender and nondistended.  Musculoskeletal: no clubbing / cyanosis. No joint deformity upper and lower extremities. Normal muscle tone.  Skin: Left gluteal abscess pocket over 3cm area. No signs of infection. Negative purulent discharge. Positive for drainage of blood. Neurologic: Non-focal Psychiatric: Alert and oriented x 3    Data Reviewed:   I have independently reviewed following labs and imaging studies:   CBC: Recent Labs  Lab 12/13/17 1100 11/21/17 0543  WBC 15.7* 15.3*  NEUTROABS 10.8*  --   HGB 12.7* 10.7*  HCT 37.8* 32.9*  MCV 85.3 86.6  PLT 371 185   Basic Metabolic Panel: Recent Labs  Lab 2017-12-13 1100 12/13/2017 1619 11/21/17 0548  NA 133*  --   --   K 4.5  --   --   CL 100*  --   --   CO2 24  --   --   GLUCOSE 252*  --   --   BUN 10  --   --   CREATININE 0.93 0.78 0.76  CALCIUM 8.6*  --   --    GFR: Estimated Creatinine Clearance: 106.9 mL/min (by C-G formula based on SCr of 0.76 mg/dL). Liver Function Tests: Recent Labs  Lab 12/13/2017 1100  AST 26  ALT 11*  ALKPHOS 90  BILITOT 1.1  PROT 7.6  ALBUMIN 3.6   No results for input(s): LIPASE, AMYLASE in the last 168 hours. No results for input(s): AMMONIA in the  last 168 hours. Coagulation Profile: No results for input(s): INR, PROTIME in the last 168 hours. Cardiac Enzymes: No results for input(s): CKTOTAL, CKMB, CKMBINDEX, TROPONINI in the last 168 hours. BNP (last 3 results) No results for input(s): PROBNP in the last 8760 hours. HbA1C: Recent Labs    Dec 13, 2017 1605  HGBA1C 11.6*   CBG: Recent Labs  Lab Dec 13, 2017 1609 13-Dec-2017 1737 12-13-2017 2207 11/21/17 0740  GLUCAP 208* 208* 265* 201*   Lipid Profile: No results for input(s): CHOL, HDL, LDLCALC, TRIG, CHOLHDL, LDLDIRECT in the last 72 hours. Thyroid Function Tests: No results for input(s): TSH, T4TOTAL, FREET4, T3FREE, THYROIDAB in the last 72 hours. Anemia Panel: No results for input(s): VITAMINB12, FOLATE, FERRITIN, TIBC, IRON, RETICCTPCT in the last 72 hours. Urine analysis:    Component Value Date/Time   COLORURINE YELLOW 09/30/2016 1630   APPEARANCEUR HAZY (A) 09/30/2016 1630   LABSPEC 1.027 09/30/2016  [place] 5.0 09/30/2016 1630   GLUCOSEU >=500 (A) 09/30/2016 1630   HGBUR NEGATIVE 09/30/2016 1630   BILIRUBINUR NEGATIVE 09/30/2016 1630   KETONESUR NEGATIVE 09/30/2016 1630   PROTEINUR NEGATIVE 09/30/2016 1630   NITRITE NEGATIVE 09/30/2016 1630   LEUKOCYTESUR NEGATIVE 09/30/2016 1630   Sepsis Labs: Invalid input(s): PROCALCITONIN, LACTICIDVEN  Recent Results (from the past 240 hour(s))  Aerobic Culture (superficial specimen)     Status: None (Preliminary result)   Collection Time: 11/20/17  4:55 PM  Result Value Ref Range Status   Specimen Description   Final    ABSCESS Performed at [facility] [address], [place], [place] [address]    Special Requests   Final    NONE Performed at [facility], [place] [address] [place], [place] [address]    Gram Stain   Final    FEW WBC PRESENT, PREDOMINANTLY PMN FEW GRAM POSITIVE COCCI RARE GRAM VARIABLE ROD Performed at [facility], [place] [address],  [place], [place] [address]    Culture PENDING  Incomplete   Report Status PENDING  Incomplete      Radiology Studies: No results found.    Medication:   . docusate sodium  100 mg Oral BID  . insulin aspart  0-9 Units Subcutaneous TID WC  . lisinopril  5 mg Oral Daily  . metoprolol tartrate  37.5 mg Oral BID  . polyethylene glycol  17 g Oral Daily  . sodium chloride flush  3 mL Intravenous Q12H  . ticagrelor  90 mg Oral BID    Continuous Infusions: . sodium chloride 1,000 mL (11/20/17 2014)  . piperacillin-tazobactam (ZOSYN)  IV Stopped (11/21/17 0839)  . vancomycin 1,000 mg (11/21/17 0839)      Time spent: 25 minutes  Signed, Lanna Poche, PA-S [employer] 2020 Email: [email or web address]

## 2017-11-22 DIAGNOSIS — K611 Rectal abscess: Secondary | ICD-10-CM | POA: Diagnosis present

## 2017-11-22 DIAGNOSIS — A419 Sepsis, unspecified organism: Secondary | ICD-10-CM | POA: Diagnosis present

## 2017-11-22 LAB — CBC
HCT: 34 % — ABNORMAL LOW (ref 39.0–52.0)
HEMOGLOBIN: 11.2 g/dL — AB (ref 13.0–17.0)
MCH: 28.3 pg (ref 26.0–34.0)
MCHC: 32.9 g/dL (ref 30.0–36.0)
MCV: 85.9 fL (ref 78.0–100.0)
Platelets: 353 10*3/uL (ref 150–400)
RBC: 3.96 MIL/uL — ABNORMAL LOW (ref 4.22–5.81)
RDW: 12.7 % (ref 11.5–15.5)
WBC: 13 10*3/uL — AB (ref 4.0–10.5)

## 2017-11-22 LAB — BASIC METABOLIC PANEL
ANION GAP: 9 (ref 5–15)
BUN: 11 mg/dL (ref 6–20)
CO2: 25 mmol/L (ref 22–32)
Calcium: 8.3 mg/dL — ABNORMAL LOW (ref 8.9–10.3)
Chloride: 104 mmol/L (ref 101–111)
Creatinine, Ser: 0.92 mg/dL (ref 0.61–1.24)
GFR calc Af Amer: >60 mL/min (ref >60)
GLUCOSE: 211 mg/dL — AB (ref 65–99)
Potassium: 3.9 mmol/L (ref 3.5–5.1)
Sodium: 138 mmol/L (ref 135–145)

## 2017-11-22 LAB — CREATININE, SERUM: Creatinine, Ser: 0.83 mg/dL (ref 0.61–1.24)

## 2017-11-22 LAB — GLUCOSE, CAPILLARY
GLUCOSE-CAPILLARY: 188 mg/dL — AB (ref 65–99)
Glucose-Capillary: 295 mg/dL — ABNORMAL HIGH (ref 65–99)

## 2017-11-22 LAB — MAGNESIUM: Magnesium: 2 mg/dL (ref 1.7–2.4)

## 2017-11-22 MED ORDER — METOPROLOL TARTRATE 25 MG PO TABS: 37.5000 mg | ORAL_TABLET | Freq: Two times a day (BID) | ORAL | 0 refills | Status: DC

## 2017-11-22 MED ORDER — GLIPIZIDE ER 10 MG PO TB24: 10.0000 mg | ORAL_TABLET | Freq: Every day | ORAL | 0 refills | Status: DC

## 2017-11-22 MED ORDER — DOXYCYCLINE HYCLATE 100 MG PO TABS
100.0000 mg | ORAL_TABLET | Freq: Two times a day (BID) | ORAL | Status: DC
  Filled 2017-11-22: qty 1

## 2017-11-22 MED ORDER — POLYETHYLENE GLYCOL 3350 17 G PO PACK: 17.0000 g | PACK | Freq: Two times a day (BID) | ORAL | 0 refills | Status: DC

## 2017-11-22 MED ORDER — DOCUSATE SODIUM 100 MG PO CAPS: 100.0000 mg | ORAL_CAPSULE | Freq: Two times a day (BID) | ORAL | 0 refills | Status: DC

## 2017-11-22 MED ORDER — LISINOPRIL 5 MG PO TABS: 5.0000 mg | ORAL_TABLET | Freq: Every day | ORAL | 0 refills | Status: DC

## 2017-11-22 MED ORDER — TRAMADOL HCL 50 MG PO TABS: 100.0000 mg | ORAL_TABLET | Freq: Four times a day (QID) | ORAL | 0 refills | Status: DC | PRN

## 2017-11-22 MED ORDER — METFORMIN HCL 850 MG PO TABS: 850.0000 mg | ORAL_TABLET | Freq: Two times a day (BID) | ORAL | 0 refills | Status: DC

## 2017-11-22 MED ORDER — POLYETHYLENE GLYCOL 3350 17 G PO PACK: 17.0000 g | PACK | Freq: Two times a day (BID) | ORAL | Status: DC

## 2017-11-22 MED ORDER — DOXYCYCLINE HYCLATE 100 MG PO TABS: 100.0000 mg | ORAL_TABLET | Freq: Two times a day (BID) | ORAL | 0 refills | Status: AC

## 2017-11-22 MED ORDER — AMOXICILLIN-POT CLAVULANATE 875-125 MG PO TABS: 1.0000 | ORAL_TABLET | Freq: Two times a day (BID) | ORAL | 0 refills | Status: AC

## 2017-11-22 MED ORDER — AMOXICILLIN-POT CLAVULANATE 875-125 MG PO TABS: 1.0000 | ORAL_TABLET | Freq: Two times a day (BID) | ORAL | Status: DC

## 2017-11-22 NOTE — Progress Notes (Signed)
[facility] Progress Note  2 Days Post-Op  Subjective: CC: gluteal abscess Patient states he has only minimal pain. Sitz baths helped a lot yesterday. Still no BM, but no n/v and no abdominal pain. UOP good. VSS.   Objective: Vital signs in last 24 hours: Temp:  [97.7 F (36.5 C)-98.7 F (37.1 C)] 98.7 F (37.1 C) (03/06 0525) Pulse Rate:  [68-81] 76 (03/06 0525) Resp:  [16] 16 (03/06 0525) BP: (124-137)/(69-81) 137/78 (03/06 0525) SpO2:  [98 %-100 %] 100 % (03/06 0525) Last BM Date: 11/16/17  Intake/Output from previous day: 03/05 0701 - 03/06 0700 In: 1821.8 [P.O.:1060; I.V.:261.8; IV Piggyback:500] Out: 2325 [Urine:2325] Intake/Output this shift: No intake/output data recorded.  PE: Gen:  Alert, NAD, pleasant Card:  Regular rate and rhythm, pedal pulses 2+ BL Pulm:  Normal effort, clear to auscultation bilaterally Abd: Soft, non-tender, non-distended, bowel sounds present GU: L buttock incision with minimal purulent drainage, surrounding erythema and induration resolved Skin: warm and dry, no rashes  Psych: A&Ox3    Lab Results:  Recent Labs    11/21/17 0543 11/22/17 0553  WBC 15.3* 13.0*  HGB 10.7* 11.2*  HCT 32.9* 34.0*  PLT 345 353   BMET Recent Labs    11/20/17 1100  11/21/17 0548 11/22/17 0553  NA 133*  --   --   --   K 4.5  --   --   --   CL 100*  --   --   --   CO2 24  --   --   --   GLUCOSE 252*  --   --   --   BUN 10  --   --   --   CREATININE 0.93   < > 0.76 0.83  CALCIUM 8.6*  --   --   --    < > = values in this interval not displayed.   PT/INR No results for input(s): LABPROT, INR in the last 72 hours. CMP     Component Value Date/Time   NA 133 (L) 11/20/2017 1100   K 4.5 11/20/2017 1100   CL 100 (L) 11/20/2017 1100   CO2 24 11/20/2017 1100   GLUCOSE 252 (H) 11/20/2017 1100   BUN 10 11/20/2017 1100   CREATININE 0.83 11/22/2017 0553   CREATININE 0.93 07/26/2013 1013   CALCIUM 8.6 (L) 11/20/2017 1100   PROT 7.6  11/20/2017 1100   ALBUMIN 3.6 11/20/2017 1100   AST 26 11/20/2017 1100   ALT 11 (L) 11/20/2017 1100   ALKPHOS 90 11/20/2017 1100   BILITOT 1.1 11/20/2017 1100   GFRNONAA >60 11/22/2017 0553   GFRAA >60 11/22/2017 0553   Lipase  No results found for: LIPASE     Studies/Results: No results found.  Anti-infectives: Anti-infectives (From admission, onward)   Start     Dose/Rate Route Frequency Ordered Stop   11/20/17 2000  vancomycin (VANCOCIN) IVPB 1000 mg/200 mL premix     1,000 mg 200 mL/hr over 60 Minutes Intravenous Every 12 hours 11/20/17 1356     11/20/17 2000  piperacillin-tazobactam (ZOSYN) IVPB 3.375 g     3.375 g 12.5 mL/hr over 240 Minutes Intravenous Every 8 hours 11/20/17 1356     11/20/17 1400  piperacillin-tazobactam (ZOSYN) IVPB 3.375 g  Status:  Discontinued     3.375 g 100 mL/hr over 30 Minutes Intravenous Every 8 hours 11/20/17 1355 11/20/17 1356   11/20/17 1400  vancomycin (VANCOCIN) IVPB 1000 mg/200 mL premix  Status:  Discontinued  1,000 mg 200 mL/hr over 60 Minutes Intravenous Every 24 hours 11/20/17 1355 11/20/17 1356   11/20/17 1130  piperacillin-tazobactam (ZOSYN) IVPB 3.375 g     3.375 g 100 mL/hr over 30 Minutes Intravenous  Once 11/20/17 1117 11/20/17 1217   11/20/17 1130  vancomycin (VANCOCIN) IVPB 1000 mg/200 mL premix     1,000 mg 200 mL/hr over 60 Minutes Intravenous  Once 11/20/17 1117 11/20/17 1341       Assessment/Plan HTN - BP well controlled Hx of STEMI on Brilinta - restarted 90 mg BID T2DM - A1C 11.6, per primary service  Left gluteal cellulitis/abscess S/p I&D 11/20/17 Dr. [place] Dienes  - POD#2 - WBC trending down - cx pending - sitz baths 3-4x daily and prn if soiled - encourage ambulation - patient may shower  FEN: hh/cm diet; bowel regimen - increase miralax to BID VTE: SCDs, Brilinta ID: [email or web address] 3/4>>  Ok to discharge from a surgical standpoint on 7d total abx and sitz baths. Wound does not need to be repacked.  Patient scheduled for f/u in [facility].    LOS: 0 days    Brigid Re , [facility] Surgery 11/22/2017, 7:46 AM Pager: [phone] Consults: [phone] Mon-Fri 7:00 am-4:30 pm Sat-Sun 7:00 am-11:30 am

## 2017-11-22 NOTE — Plan of Care (Signed)
Plan of care reviewed and discussed. °

## 2017-11-22 NOTE — Discharge Summary (Addendum)
Physician Discharge Summary  Tommy Jimenez HYQ:[id number] DOB: April 08, 1966 DOA: 11/20/2017  PCP: Carlena Hurl, PA-C  Admit date: 11/20/2017 Discharge date: 11/22/2017  Time spent: 60 minutes  Recommendations for Outpatient Follow-up:  1. Follow-up with general surgery, 12/05/2017. 2. Follow-up with Tysinger, Camelia Eng, PA-C in 2 weeks.  On follow-up patient's diabetes will need to be reassessed as patient may benefit from insulin.  Patient's blood pressure also need to be followed up upon.     Discharge Diagnoses:  Principal Problem:   Perirectal abscess Active Problems:   Cellulitis and abscess of buttock   DM (diabetes mellitus) ([place])   Hyperlipidemia   Essential hypertension, benign   Sepsis ([place])   Discharge Condition: Stable and improved  Diet recommendation: Carb modified diet  Filed Weights   11/20/17 0832 11/20/17 1139 11/20/17 1829  Weight: 76.7 kg (169 lb) 75.8 kg (167 lb) 75.8 kg (167 lb)    History of present illness:  Per Dr. Elige Ko Caffee is a 52 y.o. male with medical history significant of diabetes, hypertension has been having 1 week of painful swelling to left buttock.  There had been no drainage.  Patient has no history of abscesses in the past except whenever he was in high school.  He did not have a history of frequent infections.  He denied shaving in the area.  He stated he had been having a sore there for over 4 years that usually did not bother him but he noticed a week ago that that sore started to get bigger.  It was now bigger than the size of a golf ball so he came to the ED to have it evaluated because it is so painful and swollen.  Patient found to have an acute gluteal abscess and referred for admission for IV antibiotics and the need for surgical drainage.  General surgery has been called and they are taking him to the OR for surgical drainage.    Hospital Course:  #1 sepsis secondary to perirectal abscess/gluteal abscess Patient was  admitted with sepsis felt secondary to perirectal abscess/gluteal abscess.  Patient was pancultured.  Patient met criteria for sepsis secondary to leukocytosis on admission, fever of 101.1, perirectal abscess/gluteal abscess noted.  Patient was seen in consultation by general surgery and patient underwent a I and D of left posterior perianal region per Dr. [place] Dienes 11/20/2017 with copious purulent drainage noted.  Cultures were sent which were pending by day of discharge.  Patient was placed empirically on IV vancomycin IV Zosyn and follow.  Patient improved clinically.  Patient underwent sitz baths and pain management with clinical improvement.  Patient's diet was advanced which he tolerated.  Patient was passing flatus.  Patient had no nausea or vomiting.  Patient be discharged home on 6 more days of oral Augmentin and doxycycline to complete a one-week course of antibiotics postoperatively.  Patient will follow up with general surgery in the outpatient setting.  Patient will be discharged on sitz baths.  2.  Poorly controlled type 2 diabetes Patient noted to have poorly controlled type 2 diabetes.  Hemoglobin A1c was noted to be 11.6.  CBGs were in the 200s.  Patient was maintained on Lantus and sliding scale insulin.  Patient noted to only be on oral hypoglycemic agents prior to admission and not on any insulin as was noted that patient may have had some difficulties with insurance.  Patient is to follow-up with PCP in the outpatient setting.  3.  Hypertension Patient was seen in  consultation by cardiology for preop clearance.  Patient was placed back on lisinopril 5 mg daily as well as metoprolol with good blood pressure control.  Outpatient follow-up.  4.  Coronary artery disease status post STEMI/cardiac arrest 0/[id number]/[id number] systolic and diastolic heart failure. Patient with history of coronary artery disease status post STEMI/cardiac arrest [facility]/30/2017 at [facility] noted to  have 100% occlusion of the LAD.  Patient was noted to have an out of hospital arrest at that time.  Patient subsequently underwent drug-eluting stent with follow-up echo November 2018 with 0 residual stenosis.  Patient with follow-up echo 06/2017 with a EF of 40%.  Patient remained stable.  Patient was seen by cardiology on admission for preop clearance and cleared for surgery.  Was recommended per surgery that patient not to miss any more doses of ticagrelor in the future.  Patient also noted to be euvolemic during the hospitalization.  Patient remained in stable condition.  Outpatient follow-up.   Procedures:  I and D of left posterior perianal region per Dr. [place] Dienes 11/20/2017  Lower extremity Dopplers 11/20/2017    Consultations:  General surgery: Dr. [place] Dienes 11/20/2017  Cardiology: Dr. Oval Linsey 11/20/2017  Discharge Exam: Vitals:   11/22/17 0525 11/22/17 1300  BP: 137/78 129/86  Pulse: 76 85  Resp: 16 16  Temp: 98.7 F (37.1 C) 98.3 F (36.8 C)  SpO2: 100% 98%    General: NAD Cardiovascular: RRR Respiratory: CTAB  Discharge Instructions   Discharge Instructions    Diet Carb Modified   Complete by:  As directed    Increase activity slowly   Complete by:  As directed      Allergies as of 11/22/2017   No Known Allergies     Medication List    STOP taking these medications   Dulaglutide 1.5 MG/0.5ML Sopn Commonly known as:  TRULICITY   Insulin Glargine 100 UNIT/ML Solostar Pen Commonly known as:  LANTUS   Saxagliptin-Metformin 01-999 MG Tb24 Commonly known as:  KOMBIGLYZE XR   sildenafil 50 MG tablet Commonly known as:  VIAGRA     TAKE these medications   amoxicillin-clavulanate 875-125 MG tablet Commonly known as:  AUGMENTIN Take 1 tablet by mouth every 12 (twelve) hours for 6 days.   aspirin EC 81 MG tablet Take 81 mg by mouth daily.   atorvastatin 80 MG tablet Commonly known as:  LIPITOR Take 1 tablet (80 mg total) by mouth daily.   docusate sodium  100 MG capsule Commonly known as:  COLACE Take 1 capsule (100 mg total) by mouth 2 (two) times daily.   doxycycline 100 MG tablet Commonly known as:  VIBRA-TABS Take 1 tablet (100 mg total) by mouth every 12 (twelve) hours for 6 days.   glipiZIDE 10 MG 24 hr tablet Commonly known as:  GLUCOTROL XL Take 1 tablet (10 mg total) by mouth daily with breakfast.   lisinopril 5 MG tablet Commonly known as:  PRINIVIL,ZESTRIL Take 1 tablet (5 mg total) by mouth daily. What changed:  Another medication with the same name was removed. Continue taking this medication, and follow the directions you see here.   metFORMIN 850 MG tablet Commonly known as:  GLUCOPHAGE Take 1 tablet (850 mg total) by mouth 2 (two) times daily with a meal.   metoprolol tartrate 25 MG tablet Commonly known as:  LOPRESSOR Take 1.5 tablets (37.5 mg total) by mouth 2 (two) times daily.   polyethylene glycol packet Commonly known as:  MIRALAX / GLYCOLAX Take 17  g by mouth 2 (two) times daily.   tadalafil 10 MG tablet Commonly known as:  CIALIS 1/2-1 tablet prn   ticagrelor 90 MG Tabs tablet Commonly known as:  BRILINTA Take 90 mg by mouth 2 (two) times daily.   traMADol 50 MG tablet Commonly known as:  ULTRAM Take 2 tablets (100 mg total) by mouth every 6 (six) hours as needed for moderate pain or severe pain.      No Known Allergies Follow-up Information    Surgery, [place]. Go on 12/05/2017.   Specialty:  General Surgery Why:  Your appointment is at 9:15 AM. Please arrive 30 min prior to appointment time. Bring photo ID and insurance information.  Contact information: [address] [place] [address] [phone]        Tysinger, Camelia Eng, PA-C. Schedule an appointment as soon as possible for a visit in 2 week(s).   Specialty:  Family Medicine Contact information: [address] [place] [address] [phone]            The results of significant diagnostics  from this hospitalization (including imaging, microbiology, ancillary and laboratory) are listed below for reference.    Significant Diagnostic Studies: No results found.  Microbiology: Recent Results (from the past 240 hour(s))  Blood culture (routine x 2)     Status: None (Preliminary result)   Collection Time: 11/20/17 11:15 AM  Result Value Ref Range Status   Specimen Description   Final    BLOOD LEFT ANTECUBITAL Performed at [facility] [address] [place], [place] [address]    Special Requests   Final    IN PEDIATRIC BOTTLE Blood Culture adequate volume Performed at [facility] [address] [place], [place] [address]    Culture   Final    NO GROWTH 2 DAYS Performed at [facility] [address] [place], [place] [address]    Report Status PENDING  Incomplete  Blood culture (routine x 2)     Status: None (Preliminary result)   Collection Time: 11/20/17 12:18 PM  Result Value Ref Range Status   Specimen Description   Final    BLOOD RIGHT HAND Performed at [facility] [address], [place], [place] [address]    Special Requests   Final    BOTTLES DRAWN AEROBIC AND ANAEROBIC Blood Culture adequate volume Performed at [facility] [address], [place], [place] [address]    Culture   Final    NO GROWTH 2 DAYS Performed at [facility] [address] [place], [place] [address]    Report Status PENDING  Incomplete  Anaerobic culture     Status: None (Preliminary result)   Collection Time: 11/20/17  4:55 PM  Result Value Ref Range Status   Specimen Description   Final    ABSCESS Performed at [facility] [address], [place], [place] [address]    Special Requests   Final    NONE Performed at [facility], [place] [address] [place], [place] [address]    Culture   Final    CULTURE REINCUBATED FOR BETTER GROWTH Performed  at [facility], [place] [address], [place],  [address]    Report Status PENDING  Incomplete  Aerobic Culture (superficial specimen)     Status: None (Preliminary result)   Collection Time: 11/20/17  4:55 PM  Result Value Ref Range Status  Specimen Description   Final    ABSCESS Performed at [facility] [address] [place], [place] [address]    Special Requests   Final    NONE Performed at [facility], [place] [address] [place], [place] [address]    Gram Stain   Final    FEW WBC PRESENT, PREDOMINANTLY PMN FEW GRAM POSITIVE COCCI RARE GRAM VARIABLE ROD Performed at [facility], [place] [address], [place], [place] [address]    Culture FEW STAPHYLOCOCCUS LUGDUNENSIS  Final   Report Status PENDING  Incomplete     Labs: Basic Metabolic Panel: Recent Labs  Lab 11/20/17 1100 11/20/17 1619 11/21/17 0548 11/22/17 0553  NA 133*  --   --  138  K 4.5  --   --  3.9  CL 100*  --   --  104  CO2 24  --   --  25  GLUCOSE 252*  --   --  211*  BUN 10  --   --  11  CREATININE 0.93 0.78 0.76 0.92  0.83  CALCIUM 8.6*  --   --  8.3*  MG  --   --   --  2.0   Liver Function Tests: Recent Labs  Lab 11/20/17 1100  AST 26  ALT 11*  ALKPHOS 90  BILITOT 1.1  PROT 7.6  ALBUMIN 3.6   No results for input(s): LIPASE, AMYLASE in the last 168 hours. No results for input(s): AMMONIA in the last 168 hours. CBC: Recent Labs  Lab 11/20/17 1100 11/21/17 0543 11/22/17 0553  WBC 15.7* 15.3* 13.0*  NEUTROABS 10.8*  --   --   HGB 12.7* 10.7* 11.2*  HCT 37.8* 32.9* 34.0*  MCV 85.3 86.6 85.9  PLT 371 345 353   Cardiac Enzymes: No results for input(s): CKTOTAL, CKMB, CKMBINDEX, TROPONINI in the last 168 hours. BNP: BNP (last 3 results) No results for input(s): BNP in the last 8760 hours.  ProBNP (last 3 results) No results for input(s): PROBNP in the last 8760 hours.  CBG: Recent Labs  Lab 11/21/17 1204 11/21/17 1725  11/21/17 2245 11/22/17 0714 11/22/17 1151  GLUCAP 267* 299* 302* 188* 295*       Signed:  Irine Seal MD.  [facility] 11/22/2017, 4:00 PM

## 2017-11-23 LAB — AEROBIC CULTURE W GRAM STAIN (SUPERFICIAL SPECIMEN)

## 2017-11-23 LAB — AEROBIC CULTURE  (SUPERFICIAL SPECIMEN)

## 2017-11-25 LAB — CULTURE, BLOOD (ROUTINE X 2)
CULTURE: NO GROWTH
Culture: NO GROWTH
SPECIAL REQUESTS: ADEQUATE
Special Requests: ADEQUATE

## 2017-11-27 LAB — ANAEROBIC CULTURE

## 2017-12-12 ENCOUNTER — Ambulatory Visit (INDEPENDENT_AMBULATORY_CARE_PROVIDER_SITE_OTHER): Payer: Self-pay | Admitting: Medical

## 2017-12-12 ENCOUNTER — Other Ambulatory Visit: Payer: Self-pay

## 2017-12-12 VITALS — BP 150/96 | HR 68 | Temp 98.1°F | Wt 163.8 lb

## 2017-12-12 DIAGNOSIS — E785 Hyperlipidemia, unspecified: Secondary | ICD-10-CM

## 2017-12-12 DIAGNOSIS — I519 Heart disease, unspecified: Secondary | ICD-10-CM

## 2017-12-12 DIAGNOSIS — E0865 Diabetes mellitus due to underlying condition with hyperglycemia: Secondary | ICD-10-CM

## 2017-12-12 DIAGNOSIS — A419 Sepsis, unspecified organism: Secondary | ICD-10-CM

## 2017-12-12 DIAGNOSIS — K611 Rectal abscess: Secondary | ICD-10-CM

## 2017-12-12 DIAGNOSIS — Z91199 Patient's noncompliance with other medical treatment and regimen due to unspecified reason: Secondary | ICD-10-CM

## 2017-12-12 DIAGNOSIS — Z9119 Patient's noncompliance with other medical treatment and regimen: Secondary | ICD-10-CM

## 2017-12-12 MED ORDER — INSULIN ASPART PROT & ASPART (70-30 MIX) 100 UNIT/ML ~~LOC~~ SUSP: 10.0000 [IU] | Freq: Two times a day (BID) | SUBCUTANEOUS | 2 refills | Status: DC

## 2017-12-12 NOTE — Progress Notes (Signed)
Subjective:  Tommy Jimenez is a 52 y.o. male who presents for hospital f/u. Chief Complaint  Patient presents with  . other    boil on behind removed.     Of note his last visit here was 2015.  He says that since 2015 .  He had run into issues with insurance not being in force.  However in recent months he was seeing a doctor in [facility] and says he was seeing an endocrinologist in [place] as well.  He is here for hospital follow-up as he was just hospitalized for sepsis and perirectal abscess.  He notes that he has been compliant with his medication, checking sugars but blood sugar is still running in the 200s regularly.  He is taking Metformin and Glipizide for recent months.   He had a heart attack in September 2018.  He currently does not have insurance.    No other aggravating or relieving factors.    No other c/o.  The following portions of the patient's history were reviewed and updated as appropriate: allergies, current medications, past family history, past medical history, past social history, past surgical history and problem list.  ROS Otherwise as in subjective above  Objective: BP (!) 150/96 (BP Location: Left Arm, Patient Position: Sitting)   Pulse 68   Temp 98.1 F (36.7 C)   Wt 163 lb 12.8 oz (74.3 kg)   SpO2 99%   BMI 24.91 kg/m   General appearance: alert, no distress, well developed, well nourished Neck: supple, no lymphadenopathy, no thyromegaly, no masses Heart: RRR, normal S1, S2, no murmurs Lungs: CTA bilaterally, no wheezes, rhonchi, or rales Pulses: 2+ radial pulses, 2+ pedal pulses, normal cap refill Ext: no edema Left buttock perirectal area with 3cm long x 1.5 cm deep wound, granulation tissue present.  No fluctuance, no surrounding erythema, no pus    Assessment: Encounter Diagnoses  Name Primary?  . Diabetes mellitus due to underlying condition, uncontrolled, with hyperglycemia ([place]) Yes  . Noncompliance   .  Perirectal abscess   . Hyperlipidemia, unspecified hyperlipidemia type   . Heart disease   . Sepsis, due to unspecified organism [facility])      Plan: I reviewed the recent hospitalization records, labs in regards to sepsis, perirectal abscess, uncontrolled HTN and diabetes.  C/t Sitz baths.  Advised if any worsening redness, pus, pain, then cal or return immediately.  Wound seems to be healing reasonably well from secondary intention, discussed wound care, hygiene.  HTN - just recently started Lisinopril 5mg  in addition to continuing Metoprolol 25mg  BID.  monitor BPs and recheck 75mo  Diabetes - stop glipizide.  Begin Novolin 70/30, begin 8u BID.  C/t metformin.  He has not tolerated doses higher than 850mg  BID.  C/t monitoring.    C/t rest of medications as usual.   F/u 78mo here or with adult center for wellness given lack of insurance.  This may be more cost effective for him.   Tristram was seen today for other.  Diagnoses and all orders for this visit:  Diabetes mellitus due to underlying condition, uncontrolled, with hyperglycemia ([place])  Noncompliance  Perirectal abscess  Hyperlipidemia, unspecified hyperlipidemia type  Heart disease  Sepsis, due to unspecified organism ([place])  Other orders -     Discontinue: insulin aspart protamine- aspart (NOVOLOG MIX 70/30) (70-30) 100 UNIT/ML injection; Inject 0.1 mLs (10 Units total) into the skin 2 (two) times daily with a meal.   Follow up: 17mo

## 2017-12-12 NOTE — Patient Instructions (Addendum)
For diabetes  STOP Glipizide  Continue Metformin 853m twice daily  Begin 70/30 mix insulin, [facility] at breakfast and dinner  monitor your sugars daily fasting in the morning and before dinner (check twice daily)  Avoid low readings, see handout below  If after 2 weeks if your morning glucose is still >150, go up to 10 units twice daily    Hypoglycemia  Hypoglycemia is when the sugar (glucose) level in the blood is too low. Symptoms of low blood sugar may include:  Feeling: ? Hungry. ? Worried or nervous (anxious). ? Sweaty and clammy. ? Confused. ? Dizzy. ? Sleepy. ? Sick to your stomach (nauseous).  Having: ? A fast heartbeat. ? A headache. ? A change in your vision. ? Jerky movements that you cannot control (seizure). ? Nightmares. ? Tingling or no feeling (numbness) around the mouth, lips, or tongue.  Having trouble with: ? Talking. ? Paying attention (concentrating). ? Moving (coordination). ? Sleeping.  Shaking.  Passing out (fainting).  Getting upset easily (irritability).  Low blood sugar can happen to people who have diabetes and people who do not have diabetes. Low blood sugar can happen quickly, and it can be an emergency. Treating Low Blood Sugar Low blood sugar is often treated by eating or drinking something sugary right away. If you can think clearly and swallow safely, follow the 15:15 rule:  Take 15 grams of a fast-acting carb (carbohydrate). Some fast-acting carbs are: ? 1 tube of glucose gel. ? 3 sugar tablets (glucose pills). ? 6-8 pieces of hard candy. ? 4 oz (120 mL) of fruit juice. ? 4 oz (120 mL) of regular (not diet) soda.  Check your blood sugar 15 minutes after you take the carb.  If your blood sugar is still at or below 70 mg/dL (3.9 mmol/L), take 15 grams of a carb again.  If your blood sugar does not go above 70 mg/dL (3.9 mmol/L) after 3 tries, get help right  away.  After your blood sugar goes back to normal, eat a meal or a snack within 1 hour.  Treating Very Low Blood Sugar If your blood sugar is at or below 54 mg/dL (3 mmol/L), you have very low blood sugar (severe hypoglycemia). This is an emergency. Do not wait to see if the symptoms will go away. Get medical help right away. Call your local emergency services (911 in the [place] Do not drive yourself to the hospital. If you have very low blood sugar and you cannot eat or drink, you may need a glucagon shot (injection). A family member or friend should learn how to check your blood sugar and how to give you a glucagon shot. Ask your doctor if you need to have a glucagon shot kit at home. Follow these instructions at home: General instructions  Avoid any diets that cause you to not eat enough food. Talk with your doctor before you start any new diet.  Take over-the-counter and prescription medicines only as told by your doctor.  Limit alcohol to no more than 1 drink per day for nonpregnant women and 2 drinks per day for men. One drink equals 12 oz of beer, 5 oz of wine, or 1 oz of hard liquor.  Keep all follow-up visits as told by your doctor. This is important. If You Have Diabetes:   Make sure you know the symptoms of low blood sugar.  Always keep a source of  sugar with you, such as: ? Sugar. ? Sugar tablets. ? Glucose gel. ? Fruit juice. ? Regular soda (not diet soda). ? Milk. ? Hard candy. ? Honey.  Take your medicines as told.  Follow your exercise and meal plan. ? Eat on time. Do not skip meals. ? Follow your sick day plan when you cannot eat or drink normally. Make this plan ahead of time with your doctor.  Check your blood sugar as often as told by your doctor. Always check before and after exercise.  Share your diabetes care plan with: ? Your work or school. ? People you live with.  Check your pee (urine) for ketones: ? When you are sick. ? As told by your  doctor.  Carry a card or wear jewelry that says you have diabetes. If You Have Low Blood Sugar From Other Causes:   Check your blood sugar as often as told by your doctor.  Follow instructions from your doctor about what you cannot eat or drink. Contact a doctor if:  You have trouble keeping your blood sugar in your target range.  You have low blood sugar often. Get help right away if:  You still have symptoms after you eat or drink something sugary.  Your blood sugar is at or below 54 mg/dL (3 mmol/L).  You have jerky movements that you cannot control.  You pass out. These symptoms may be an emergency. Do not wait to see if the symptoms will go away. Get medical help right away. Call your local emergency services (911 in the [place] Do not drive yourself to the hospital. This information is not intended to replace advice given to you by your health care provider. Make sure you discuss any questions you have with your health care provider. Document Released: 11/30/2009 Document Revised: 02/11/2016 Document Reviewed: 10/09/2015 Elsevier Interactive Patient Education  [employer].

## 2017-12-25 ENCOUNTER — Other Ambulatory Visit: Payer: Self-pay | Admitting: Medical

## 2017-12-25 ENCOUNTER — Telehealth: Payer: Self-pay | Admitting: Medical

## 2017-12-25 DIAGNOSIS — E119 Type 2 diabetes mellitus without complications: Principal | ICD-10-CM

## 2017-12-25 DIAGNOSIS — IMO0001 Reserved for inherently not codable concepts without codable children: Secondary | ICD-10-CM

## 2017-12-25 DIAGNOSIS — Z794 Long term (current) use of insulin: Principal | ICD-10-CM

## 2017-12-25 NOTE — Telephone Encounter (Signed)
Please call patient.  He doesn't qualify for diabetes study.   Dr. Valere Dross at [facility] study also noted he has not started the Novolin 70/30 insulin.   The suspicion is that he may be insulin dependent, so he needs to be using the Novolin 70/30 immediately.   Call and see if he is using this or not.   Also, stop Metformin for now, he should have already stopped the Glipizide.  I want to make sure he is using the Novolin 70/30, 8 units with breakfast, lunch, and dinner, 3 times daily with meals.   If he is already doing this, what has the morning glucose readings been for the past week?  See if he called to get appt at [facility] since he has no insurance?  Finally, I need him to come by for 2 labs regarding whether his body is producing insulin (C-peptide and insulin level)

## 2017-12-25 NOTE — Telephone Encounter (Signed)
Called, LVM to call back to discuss these issues. Left call back number.

## 2017-12-26 NOTE — Telephone Encounter (Signed)
Called patient and advised to call back to discuss, LVM

## 2017-12-27 ENCOUNTER — Telehealth: Payer: Self-pay

## 2017-12-27 ENCOUNTER — Other Ambulatory Visit: Payer: Self-pay

## 2017-12-27 ENCOUNTER — Telehealth: Payer: Self-pay | Admitting: Medical

## 2017-12-27 DIAGNOSIS — E0865 Diabetes mellitus due to underlying condition with hyperglycemia: Secondary | ICD-10-CM

## 2017-12-27 DIAGNOSIS — Z794 Long term (current) use of insulin: Principal | ICD-10-CM

## 2017-12-27 DIAGNOSIS — IMO0001 Reserved for inherently not codable concepts without codable children: Secondary | ICD-10-CM

## 2017-12-27 DIAGNOSIS — E119 Type 2 diabetes mellitus without complications: Principal | ICD-10-CM

## 2017-12-27 NOTE — Telephone Encounter (Signed)
error 

## 2017-12-27 NOTE — Telephone Encounter (Signed)
Patient returned call stating he just started his Glucose reading on yesterday (12/26/17) Glucose reading was 145 yesterday morning. Glucose reading was 80 this morning. Patient has not been scheduled for Adult [facility] center. He was given the phone number to call and schedule an appointment with them. Patient was also scheduled today to have 2 labs done per provider.

## 2017-12-27 NOTE — Telephone Encounter (Signed)
Pt came in for labs and while here requested refills on lisinopril, lopressor and cialis. Please send to [place] and [place]. Pt can be reached at [phone].

## 2017-12-28 ENCOUNTER — Other Ambulatory Visit: Payer: Self-pay

## 2017-12-28 LAB — INSULIN AND C-PEPTIDE, SERUM
C-Peptide: 1.8 ng/mL (ref 1.1–4.4)
INSULIN: 25.5 u[IU]/mL — ABNORMAL HIGH (ref 2.6–24.9)

## 2017-12-28 MED ORDER — LISINOPRIL 5 MG PO TABS: 5.0000 mg | ORAL_TABLET | Freq: Every day | ORAL | 0 refills | Status: DC

## 2017-12-28 MED ORDER — METOPROLOL TARTRATE 25 MG PO TABS: 37.5000 mg | ORAL_TABLET | Freq: Two times a day (BID) | ORAL | 0 refills | Status: DC

## 2017-12-28 MED ORDER — TADALAFIL 10 MG PO TABS
ORAL_TABLET | ORAL | 0 refills | Status: DC
Start: 2017-12-28 — End: 2021-12-06

## 2017-12-28 NOTE — Telephone Encounter (Signed)
Called patient and advised of the message from [place]. Patient understood, and had no questions or concerns.

## 2017-12-28 NOTE — Telephone Encounter (Signed)
Called patient and advised of the message from [place]. Patient understood, and had no questions or concerns.

## 2017-12-28 NOTE — Telephone Encounter (Signed)
pls refer for 90 day, no refill

## 2017-12-28 NOTE — Telephone Encounter (Signed)
Called patient and advised of the message from [place]. Patient understood, and had no questions or concerns.

## 2017-12-28 NOTE — Telephone Encounter (Signed)
Ok, those numbers sound ok.   C/t same medications.   His tests from yesterday were ok.   His body is still making some insulin as well.  C/t efforts to get into [facility]

## 2018-03-08 ENCOUNTER — Other Ambulatory Visit: Payer: Self-pay | Admitting: Medical

## 2018-03-08 DIAGNOSIS — I10 Essential (primary) hypertension: Secondary | ICD-10-CM

## 2018-04-08 ENCOUNTER — Other Ambulatory Visit: Payer: Self-pay | Admitting: Medical

## 2018-05-01 ENCOUNTER — Other Ambulatory Visit: Payer: Self-pay | Admitting: Medical

## 2018-05-01 ENCOUNTER — Telehealth: Payer: Self-pay | Admitting: Family Medicine

## 2018-05-01 DIAGNOSIS — I10 Essential (primary) hypertension: Secondary | ICD-10-CM

## 2018-05-01 NOTE — Telephone Encounter (Signed)
Called pt no answer to make a med check or CPE with [facility]. Awaiting phone call. [place]

## 2018-05-01 NOTE — Telephone Encounter (Signed)
Called pt home and cell, lmtrc.  Needs appt. Audelia Acton req 1 month followup from March.   [facility] req Metoprol Tar 25 mg tab   90 day

## 2018-05-03 NOTE — Telephone Encounter (Signed)
yes

## 2018-05-03 NOTE — Telephone Encounter (Signed)
lmom asking patient to call the office to schedule a follow up. Due to being unable to reach patient by phone, would you like a letter to be sent out?

## 2018-05-04 ENCOUNTER — Other Ambulatory Visit: Payer: Self-pay | Admitting: Medical

## 2018-05-04 DIAGNOSIS — I10 Essential (primary) hypertension: Secondary | ICD-10-CM

## 2018-05-04 NOTE — Telephone Encounter (Signed)
Letter has been sent

## 2018-05-10 ENCOUNTER — Telehealth: Payer: Self-pay | Admitting: Medical

## 2018-05-10 ENCOUNTER — Ambulatory Visit (INDEPENDENT_AMBULATORY_CARE_PROVIDER_SITE_OTHER): Payer: Self-pay | Admitting: Medical

## 2018-05-10 DIAGNOSIS — E785 Hyperlipidemia, unspecified: Secondary | ICD-10-CM

## 2018-05-10 DIAGNOSIS — Z955 Presence of coronary angioplasty implant and graft: Secondary | ICD-10-CM

## 2018-05-10 DIAGNOSIS — N529 Male erectile dysfunction, unspecified: Secondary | ICD-10-CM

## 2018-05-10 DIAGNOSIS — E1169 Type 2 diabetes mellitus with other specified complication: Secondary | ICD-10-CM

## 2018-05-10 DIAGNOSIS — Z794 Long term (current) use of insulin: Secondary | ICD-10-CM

## 2018-05-10 DIAGNOSIS — I10 Essential (primary) hypertension: Secondary | ICD-10-CM

## 2018-05-10 MED ORDER — INSULIN ASPART PROT & ASPART (70-30 MIX) 100 UNIT/ML PEN: 20.0000 [IU] | PEN_INJECTOR | Freq: Two times a day (BID) | SUBCUTANEOUS | 3 refills | Status: DC

## 2018-05-10 MED ORDER — LISINOPRIL 5 MG PO TABS: 5.0000 mg | ORAL_TABLET | Freq: Every day | ORAL | 5 refills | Status: DC

## 2018-05-10 MED ORDER — ATORVASTATIN CALCIUM 80 MG PO TABS: 80.0000 mg | ORAL_TABLET | Freq: Every day | ORAL | 5 refills | Status: DC

## 2018-05-10 MED ORDER — ASPIRIN EC 81 MG PO TBEC: 81.0000 mg | DELAYED_RELEASE_TABLET | Freq: Every day | ORAL | 11 refills | Status: DC

## 2018-05-10 MED ORDER — METOPROLOL TARTRATE 25 MG PO TABS: ORAL_TABLET | ORAL | 5 refills | Status: DC

## 2018-05-10 NOTE — Progress Notes (Signed)
Subjective: Chief Complaint  Patient presents with  . med check    med check refill metoprolol   Here for med check.  He notes compliance of medication.  Diabetes- he is using 10 units Novolin 70/30 BID, trying to eat healthy and exercise.  Checking sugars, morning sugars averaging in the 180s.  No foot concerns.  He has no insurance currently, currently been unemployed for most of the past year after losing his job.  His family has been helping him  Hypertension-compliant with lisinopril 5 mg and metoprolol 25 mg twice daily.  Hyperlipidemia-compliant with Lipitor 80 mg daily and aspirin  History of heart disease and stent, he continues on Brilinta which he gets through the [facility] program.  He also gets aspirin and cholesterol through the med assist program.  History of heart attack  05/30/2017.      Past Medical History:  Diagnosis Date  . Coronary artery disease    stent placed  . DM (diabetes mellitus) ([place]) 1996  . DVT (deep venous thrombosis) ([facility])    right leg   . Erectile dysfunction   . HTN (hypertension)   . Myocardial infarction ([place])   . Peripheral vascular disease ([place])    Current Outpatient Medications on File Prior to Visit  Medication Sig Dispense Refill  . Insulin NPH Isophane & Regular (NOVOLIN 70/30 FLEXPEN [place]) Inject 10 Units into the skin 2 (two) times daily.    . tadalafil (CIALIS) 10 MG tablet 1/2-1 tablet prn 90 tablet 0  . ticagrelor (BRILINTA) 90 MG TABS tablet Take 90 mg by mouth 2 (two) times daily.     No current facility-administered medications on file prior to visit.    ROS as in subjective    Objective: BP 120/74   Pulse 69   Temp 97.9 F (36.6 C) (Oral)   Resp 16   Ht 5\' 8"  (1.727 m)   Wt 166 lb 6.4 oz (75.5 kg)   SpO2 98%   BMI 25.30 kg/m   General appearence: alert, no distress, WD/WN,  HEENT: normocephalic, sclerae anicteric, TMs pearly, nares patent, no discharge or erythema, pharynx normal Oral cavity: MMM, no  lesions Neck: supple, no lymphadenopathy, no thyromegaly, no masses Heart: RRR, normal S1, S2, no murmurs Lungs: CTA bilaterally, no wheezes, rhonchi, or rales Abdomen: +bs, soft, non tender, non distended, no masses, no hepatomegaly, no splenomegaly Pulses: 2+ symmetric, upper and lower extremities, normal cap refill Ext: no edema    Assessment: Encounter Diagnoses  Name Primary?  . Type 2 diabetes mellitus with other specified complication, with long-term current use of insulin ([place])   . Essential hypertension, benign   . S/P coronary artery stent placement   . Hyperlipidemia, unspecified hyperlipidemia type   . Erectile dysfunction, unspecified erectile dysfunction type       Plan: Medications: increase to 13u BID Novolog 70/30, and discussed increasing dose weekly by 1-2 units if not at goal All other medications the same  Education today included:  blood sugar goals, complications of diabetes mellitus, hypoglycemia prevention and treatment, exercise, self-monitoring of blood glucose skills, nutrition, insulin adjustments and self-injection of insulin Advised daily foot checks, yearly diabetic eye exams, dental hygiene Compliance at present is estimated to be fair. Efforts to improve compliance (if necessary) will be directed at dietary modifications: as discussed, increased exercise and regular blood sugar monitoring: 2 times daily.  Referral to [facility] due to no insurance.  Follow up: I recommend diabetes care be pending labs.  Shavar was seen today for med check.  Diagnoses and all orders for this visit:  Type 2 diabetes mellitus with other specified complication, with long-term current use of insulin ([facility]) -     CBC -     Comprehensive metabolic panel -     Lipid panel  Essential hypertension, benign -     metoprolol tartrate (LOPRESSOR) 25 MG tablet; TAKE 1 & 1/2 (ONE & ONE-HALF) TABLETS BY MOUTH TWICE DAILY  S/P coronary artery stent  placement  Hyperlipidemia, unspecified hyperlipidemia type -     CBC -     Comprehensive metabolic panel -     Lipid panel  Erectile dysfunction, unspecified erectile dysfunction type  Other orders -     aspirin EC 81 MG tablet; Take 1 tablet (81 mg total) by mouth daily. -     atorvastatin (LIPITOR) 80 MG tablet; Take 1 tablet (80 mg total) by mouth daily. -     lisinopril (PRINIVIL,ZESTRIL) 5 MG tablet; Take 1 tablet (5 mg total) by mouth daily. -     insulin aspart protamine - aspart (NOVOLOG MIX 70/30 FLEXPEN) (70-30) 100 UNIT/ML FlexPen; Inject 0.2 mLs (20 Units total) into the skin 2 (two) times daily. Relion brand, up to 20 u BID

## 2018-05-10 NOTE — Patient Instructions (Signed)
We will refer you to the adult wellness clinic which may be a cheaper option  Continue plans to enroll in the heart study that you mention today  I recommend you get a yearly flu shot at a local pharmacy where it is cheaper cash pay price

## 2018-05-10 NOTE — Telephone Encounter (Signed)
Refer to [facility] since he is self pay

## 2018-05-11 LAB — COMPREHENSIVE METABOLIC PANEL
A/G RATIO: 1.4 (ref 1.2–2.2)
ALK PHOS: 101 IU/L (ref 39–117)
ALT: 15 IU/L (ref 0–44)
AST: 18 IU/L (ref 0–40)
Albumin: 4.3 g/dL (ref 3.5–5.5)
BILIRUBIN TOTAL: 0.4 mg/dL (ref 0.0–1.2)
BUN/Creatinine Ratio: 10 (ref 9–20)
BUN: 10 mg/dL (ref 6–24)
CHLORIDE: 102 mmol/L (ref 96–106)
CO2: 22 mmol/L (ref 20–29)
Calcium: 9.6 mg/dL (ref 8.7–10.2)
Creatinine, Ser: 1.04 mg/dL (ref 0.76–1.27)
GFR calc non Af Amer: 83 mL/min/{1.73_m2} (ref >59)
GFR, EST AFRICAN AMERICAN: 96 mL/min/{1.73_m2} (ref >59)
GLUCOSE: 229 mg/dL — AB (ref 65–99)
Globulin, Total: 3 g/dL (ref 1.5–4.5)
POTASSIUM: 4.5 mmol/L (ref 3.5–5.2)
Sodium: 138 mmol/L (ref 134–144)
Total Protein: 7.3 g/dL (ref 6.0–8.5)

## 2018-05-11 LAB — LIPID PANEL
CHOLESTEROL TOTAL: 184 mg/dL (ref 100–199)
Chol/HDL Ratio: 3.4 ratio (ref 0.0–5.0)
HDL: 54 mg/dL (ref >39)
LDL Calculated: 115 mg/dL — ABNORMAL HIGH (ref 0–99)
Triglycerides: 75 mg/dL (ref 0–149)
VLDL CHOLESTEROL CAL: 15 mg/dL (ref 5–40)

## 2018-05-11 LAB — CBC
Hematocrit: 44.5 % (ref 37.5–51.0)
Hemoglobin: 14.9 g/dL (ref 13.0–17.7)
MCH: 28.6 pg (ref 26.6–33.0)
MCHC: 33.5 g/dL (ref 31.5–35.7)
MCV: 85 fL (ref 79–97)
PLATELETS: 331 10*3/uL (ref 150–450)
RBC: 5.21 x10E6/uL (ref 4.14–5.80)
RDW: 14.8 % (ref 12.3–15.4)
WBC: 10.9 10*3/uL — ABNORMAL HIGH (ref 3.4–10.8)

## 2018-05-11 LAB — POCT GLYCOSYLATED HEMOGLOBIN (HGB A1C): HEMOGLOBIN A1C: 11.5 % — AB (ref 4.0–5.6)

## 2018-05-11 NOTE — Addendum Note (Signed)
Addended by: Gwinda Maine on: 05/11/2018 12:34 PM   Modules accepted: Orders

## 2018-05-15 NOTE — Telephone Encounter (Signed)
Patient notified to call and make his own appointment.

## 2018-05-16 ENCOUNTER — Telehealth: Payer: Self-pay | Admitting: Medical

## 2018-05-16 ENCOUNTER — Other Ambulatory Visit: Payer: Self-pay | Admitting: Medical

## 2018-05-16 MED ORDER — INSULIN ISOPHANE & REGULAR (HUMAN 70-30)100 UNIT/ML KWIKPEN: 13.0000 [IU] | PEN_INJECTOR | Freq: Two times a day (BID) | SUBCUTANEOUS | 5 refills | Status: DC

## 2018-05-16 NOTE — Telephone Encounter (Signed)
Please call him back about insulin.  I received clarification request from [facility].  Is he using Novolin 70/30 or Novolog 70/30? And is he using BID or TID?  How many units?  Does he needs this refilled at a specific pharmacy or is he getting this OTC?

## 2018-05-16 NOTE — Telephone Encounter (Signed)
Patient is taking Novolin 70/30, 13 units BID  [facility]  [address]

## 2018-05-23 ENCOUNTER — Telehealth: Payer: Self-pay | Admitting: Medical

## 2018-05-23 NOTE — Telephone Encounter (Signed)
Pt come by and states that his sugars are still not running around 130, states in the afternoon they are around 180 and in the morning they are 104 to 109, he just wanted to let you know, pt can be reached at [phone]

## 2018-05-23 NOTE — Telephone Encounter (Signed)
I recommend he increase morning dose by 4 units.  He is using 70/30 insulin.

## 2018-05-24 NOTE — Telephone Encounter (Signed)
Patient notified to increase 4 unit.  He states that he is on 70/30 insulin.  He is currently taking 13 units before the increase.

## 2018-05-25 ENCOUNTER — Telehealth: Payer: Self-pay

## 2018-05-25 NOTE — Telephone Encounter (Signed)
Pt called to inform of his morning glucose reading.   170 before increase to 18 units. At 4:30 in the afternoon he was at 130. Around 10:30 last night it was 140. This morning he was back at 170. Pt only took insulin 1 time yesterday. Pt was advised to go ahead and take the 18 units this morning. Please advise further.

## 2018-05-31 NOTE — Telephone Encounter (Signed)
Continue this plan for the next few weeks

## 2018-08-07 ENCOUNTER — Telehealth: Payer: Self-pay | Admitting: Medical

## 2018-08-07 NOTE — Telephone Encounter (Signed)
Pt brought by Pt Assistance Application for Brilinta, cardiologist was prescribing but he doesn't have insurance &he is unable to find a cardiologist that will see him without insurance.  Per Audelia Acton he will complete the form and suggested the following Dr. Irish Lack, Dr. Einar Gip and Ezequiel Kayser, phone numbers & info given to pt.  Also advised pt it is time to schedule diabetes check.

## 2018-08-07 NOTE — Telephone Encounter (Signed)
Pt Assistance application completed & faxed

## 2018-08-22 ENCOUNTER — Telehealth: Payer: Self-pay | Admitting: Medical

## 2018-08-22 NOTE — Telephone Encounter (Signed)
I received a document from [facility] in [place].  Is he using this?  I suspect this is a scam letter requesting info on him.  Please inquire

## 2018-08-24 NOTE — Telephone Encounter (Signed)
Patient states that he does not use this pharmacy and has never heard of them.

## 2018-09-20 ENCOUNTER — Encounter: Payer: BLUE CROSS/BLUE SHIELD | Admitting: Medical

## 2018-09-28 ENCOUNTER — Telehealth: Payer: Self-pay

## 2018-09-28 NOTE — Telephone Encounter (Signed)
SENT REFERRAL TO SCHEDULING NO NOTES 

## 2018-10-10 ENCOUNTER — Ambulatory Visit: Payer: BLUE CROSS/BLUE SHIELD | Admitting: Podiatry

## 2018-10-10 ENCOUNTER — Encounter: Payer: Self-pay | Admitting: Podiatry

## 2018-10-10 VITALS — BP 126/82

## 2018-10-10 DIAGNOSIS — E1142 Type 2 diabetes mellitus with diabetic polyneuropathy: Secondary | ICD-10-CM

## 2018-10-10 DIAGNOSIS — L84 Corns and callosities: Secondary | ICD-10-CM

## 2018-10-10 DIAGNOSIS — M79674 Pain in right toe(s): Secondary | ICD-10-CM | POA: Diagnosis not present

## 2018-10-10 DIAGNOSIS — M79675 Pain in left toe(s): Secondary | ICD-10-CM | POA: Diagnosis not present

## 2018-10-10 DIAGNOSIS — B351 Tinea unguium: Secondary | ICD-10-CM | POA: Diagnosis not present

## 2018-10-10 NOTE — Patient Instructions (Addendum)
Diabetic Neuropathy °Diabetic neuropathy refers to nerve damage that is caused by diabetes (diabetes mellitus). Over time, people with diabetes can develop nerve damage throughout the body. There are several types of diabetic neuropathy: °· Peripheral neuropathy. This is the most common type of diabetic neuropathy. It causes damage to nerves that carry signals between the spinal cord and other parts of the body (peripheral nerves). This usually affects nerves in the feet and legs first, and may eventually affect the hands and arms. The damage affects the ability to sense touch or temperature. °· Autonomic neuropathy. This type causes damage to nerves that control involuntary functions (autonomic nerves). These nerves carry signals that control: °? Heartbeat. °? Body temperature. °? Blood pressure. °? Urination. °? Digestion. °? Sweating. °? Sexual function. °? Response to changing blood sugar (glucose) levels. °· Focal neuropathy. This type of nerve damage affects one area of the body, such as an arm, a leg, or the face. The injury may involve one nerve or a small group of nerves. Focal neuropathy can be painful and unpredictable, and occurs most often in older adults with diabetes. This often develops suddenly, but usually improves over time and does not cause long-term problems. °· Proximal neuropathy. This type of nerve damage affects the nerves of the thighs, hips, buttocks, or legs. It causes severe pain, weakness, and muscle death (atrophy), usually in the thigh muscles. It is more common among older men and people who have type 2 diabetes. The length of recovery time may vary. °What are the causes? °Peripheral, autonomic, and focal neuropathies are caused by diabetes that is not well controlled with treatment. The cause of proximal neuropathy is not known, but it may be caused by inflammation related to uncontrolled blood glucose levels. °What are the signs or symptoms? °Peripheral neuropathy °Peripheral  neuropathy develops slowly over time. When the nerves of the feet and legs no longer work, you may experience: °· Burning, stabbing, or aching pain in the legs or feet. °· Pain or cramping in the legs or feet. °· Loss of feeling (numbness) and inability to feel pressure or pain in the feet. This can lead to: °? Thick calluses or sores on areas of constant pressure. °? Ulcers. °? Reduced ability to feel temperature changes. °· Foot deformities. °· Muscle weakness. °· Loss of balance or coordination. °Autonomic neuropathy °The symptoms of autonomic neuropathy vary depending on which nerves are affected. Symptoms may include: °· Problems with digestion, such as: °? Nausea or vomiting. °? Poor appetite. °? Bloating. °? Diarrhea or constipation. °? Trouble swallowing. °? Losing weight without trying to. °· Problems with the heart, blood and lungs, such as: °? Dizziness, especially when standing up. °? Fainting. °? Shortness of breath. °? Irregular heartbeat. °· Bladder problems, such as: °? Trouble starting or stopping urination. °? Leaking urine. °? Trouble emptying the bladder. °? Urinary tract infections (UTIs). °· Problems with other body functions, such as: °? Sweat. You may sweat too much or too little. °? Temperature. You might get hot easily. Or, you might feel cold more than usual. °? Sexual function. Men may not be able to get or maintain an erection. Women may have vaginal dryness and difficulty with arousal. °Focal neuropathy °Symptoms affect only one area of the body. Common symptoms include: °· Numbness. °· Tingling. °· Burning pain. °· Prickling feeling. °· Very sensitive skin. °· Weakness. °· Inability to move (paralysis). °· Muscle twitching. °· Muscles getting smaller (wasting). °· Poor coordination. °· Double or blurred vision. °Proximal   neuropathy °· Sudden, severe pain in the hip, thigh, or buttocks. Pain may spread from the back into the legs (sciatica). °· Pain and numbness in the arms and  legs. °· Tingling. °· Loss of bladder control or bowel control. °· Weakness and wasting of thigh muscles. °· Difficulty getting up from a seated position. °· Abdominal swelling. °· Unexplained weight loss. °How is this diagnosed? °Diagnosis usually involves reviewing your medical history and any symptoms you have. Diagnosis varies depending on the type of neuropathy your health care provider suspects. °Peripheral neuropathy °Your health care provider will check areas that are affected by your nervous system (neurologic exam), such as your reflexes, how you move, and what you can feel. You may have other tests, such as: °· Blood tests. °· Removal and examination of fluid that surrounds the spinal cord (lumbar puncture). °· CT scan. °· MRI. °· A test to check the nerves that control muscles (electromyogram, EMG). °· Tests of how quickly messages pass through your nerves (nerve conduction velocity tests). °· Removal of a small piece of nerve to be examined under a microscope (biopsy). °Autonomic neuropathy °You may have tests, such as: °· Tests to measure your blood pressure and heart rate. This may include monitoring you while you are safely secured to an exam table that moves you from a lying position to an upright position (table tilt test). °· Breathing tests to check your lungs. °· Tests to check how food moves through the digestive system (gastric emptying tests). °· Blood, sweat, or urine tests. °· Ultrasound of your bladder. °· Spinal fluid tests. °Focal neuropathy °This condition may be diagnosed with: °· A neurologic exam. °· CT scan. °· MRI. °· EMG. °· Nerve conduction velocity tests. °Proximal neuropathy °There is no test to diagnose this type of neuropathy. You may have tests to rule out other possible causes of this type of neuropathy. Tests may include: °· X-rays of your spine and lumbar region. °· Lumbar puncture. °· MRI. °How is this treated? °The goal of treatment is to keep nerve damage from getting  worse. The most important part of treatment is keeping your blood glucose level and your A1C level within your target range by following your diabetes management plan. Over time, maintaining lower blood glucose levels helps lessen symptoms. In some cases, you may need prescription pain medicine. °Follow these instructions at home: ° °Lifestyle ° °· Do not use any products that contain nicotine or tobacco, such as cigarettes and e-cigarettes. If you need help quitting, ask your health care provider. °· Be physically active every day. Include strength training and balance exercises. °· Follow a healthy meal plan. °· Work with your health care provider to manage your blood pressure. °General instructions °· Follow your diabetes management plan as directed. °? Check your blood glucose levels as directed by your health care provider. °? Keep your blood glucose in your target range as directed by your health care provider. °? Have your A1C level checked at least two times a year, or as often as told by your health care provider. °· Take over the counter and prescription medicines only as told by your health care provider. This includes insulin and diabetes medicine. °· Do not drive or use heavy machinery while taking prescription pain medicines. °· Check your skin and feet every day for cuts, bruises, redness, blisters, or sores. °· Keep all follow up visits as told by your health care provider. This is important. °Contact a health care provider if: °·   You have burning, stabbing, or aching pain in your legs or feet. °· You are unable to feel pressure or pain in your feet. °· You develop problems with digestion, such as: °? Nausea. °? Vomiting. °? Bloating. °? Constipation. °? Diarrhea. °? Abdominal pain. °· You have difficulty with urination, such as inability: °? To control when you urinate (incontinence). °? To completely empty the bladder (retention). °· You have palpitations. °· You feel dizzy, weak, or faint when you  stand up. °Get help right away if: °· You cannot urinate. °· You have sudden weakness or loss of coordination. °· You have trouble speaking. °· You have pain or pressure in your chest. °· You have an irregular heart beat. °· You have sudden inability to move a part of your body. °Summary °· Diabetic neuropathy refers to nerve damage that is caused by diabetes. It can affect nerves throughout the entire body, causing numbness and pain in the arms, legs, digestive tract, heart, and other body systems. °· Keep your blood glucose level and your blood pressure in your target range, as directed by your health care provider. This can help prevent neuropathy from getting worse. °· Check your skin and feet every day for cuts, bruises, redness, blisters, or sores. °· Do not use any products that contain nicotine or tobacco, such as cigarettes and e-cigarettes. If you need help quitting, ask your health care provider. °This information is not intended to replace advice given to you by your health care provider. Make sure you discuss any questions you have with your health care provider. °Document Released: 11/14/2001 Document Revised: 10/18/2017 Document Reviewed: 10/10/2016 °Elsevier Interactive Patient Education © 2019 [employer] ° ° °Diabetes Mellitus and Foot Care °Foot care is an important part of your health, especially when you have diabetes. Diabetes may cause you to have problems because of poor blood flow (circulation) to your feet and legs, which can cause your skin to: °· Become thinner and drier. °· Break more easily. °· Heal more slowly. °· Peel and crack. °You may also have nerve damage (neuropathy) in your legs and feet, causing decreased feeling in them. This means that you may not notice minor injuries to your feet that could lead to more serious problems. Noticing and addressing any potential problems early is the best way to prevent future foot problems. °How to care for your feet °Foot hygiene °· Wash  your feet daily with warm water and mild soap. Do not use hot water. Then, pat your feet and the areas between your toes until they are completely dry. Do not soak your feet as this can dry your skin. °· Trim your toenails straight across. Do not dig under them or around the cuticle. File the edges of your nails with an emery board or nail file. °· Apply a moisturizing lotion or petroleum jelly to the skin on your feet and to dry, brittle toenails. Use lotion that does not contain alcohol and is unscented. Do not apply lotion between your toes. °Shoes and socks °· Wear clean socks or stockings every day. Make sure they are not too tight. Do not wear knee-high stockings since they may decrease blood flow to your legs. °· Wear shoes that fit properly and have enough cushioning. Always look in your shoes before you put them on to be sure there are no objects inside. °· To break in new shoes, wear them for just a few hours a day. This prevents injuries on your feet. °Wounds, scrapes, corns,   and calluses °· Check your feet daily for blisters, cuts, bruises, sores, and redness. If you cannot see the bottom of your feet, use a mirror or ask someone for help. °· Do not cut corns or calluses or try to remove them with medicine. °· If you find a minor scrape, cut, or break in the skin on your feet, keep it and the skin around it clean and dry. You may clean these areas with mild soap and water. Do not clean the area with peroxide, alcohol, or iodine. °· If you have a wound, scrape, corn, or callus on your foot, look at it several times a day to make sure it is healing and not infected. Check for: °? Redness, swelling, or pain. °? Fluid or blood. °? Warmth. °? Pus or a bad smell. °General instructions °· Do not cross your legs. This may decrease blood flow to your feet. °· Do not use heating pads or hot water bottles on your feet. They may burn your skin. If you have lost feeling in your feet or legs, you may not know this is  happening until it is too late. °· Protect your feet from hot and cold by wearing shoes, such as at the beach or on hot pavement. °· Schedule a complete foot exam at least once a year (annually) or more often if you have foot problems. If you have foot problems, report any cuts, sores, or bruises to your health care provider immediately. °Contact a health care provider if: °· You have a medical condition that increases your risk of infection and you have any cuts, sores, or bruises on your feet. °· You have an injury that is not healing. °· You have redness on your legs or feet. °· You feel burning or tingling in your legs or feet. °· You have pain or cramps in your legs and feet. °· Your legs or feet are numb. °· Your feet always feel cold. °· You have pain around a toenail. °Get help right away if: °· You have a wound, scrape, corn, or callus on your foot and: °? You have pain, swelling, or redness that gets worse. °? You have fluid or blood coming from the wound, scrape, corn, or callus. °? Your wound, scrape, corn, or callus feels warm to the touch. °? You have pus or a bad smell coming from the wound, scrape, corn, or callus. °? You have a fever. °? You have a red line going up your leg. °Summary °· Check your feet every day for cuts, sores, red spots, swelling, and blisters. °· Moisturize feet and legs daily. °· Wear shoes that fit properly and have enough cushioning. °· If you have foot problems, report any cuts, sores, or bruises to your health care provider immediately. °· Schedule a complete foot exam at least once a year (annually) or more often if you have foot problems. °This information is not intended to replace advice given to you by your health care provider. Make sure you discuss any questions you have with your health care provider. °Document Released: 09/02/2000 Document Revised: 10/18/2017 Document Reviewed: 10/07/2016 °Elsevier Interactive Patient Education © 2019 [employer] ° °

## 2018-10-22 NOTE — Progress Notes (Signed)
Subjective: Tommy Jimenez presents today referred by Carlena Hurl, PA-C for diabetic foot evaluation.  [name] relates he has been diabetic for more than 20 years.  He relates he has had numbness, pins-and-needles sensation in both feet for approximately 1 year.  He denies any history of foot wounds.  He does not get pedicures.  He states he does clip his own toenails.    Past Medical History:  Diagnosis Date  . Coronary artery disease    stent placed  . DM (diabetes mellitus) ([place]) 1996  . DVT (deep venous thrombosis) ([facility])    right leg   . Erectile dysfunction   . HTN (hypertension)   . Myocardial infarction ([place])   . Peripheral vascular disease [facility])     Patient Active Problem List   Diagnosis Date Noted  . Heart disease 12/12/2017  . Noncompliance 12/12/2017  . Sepsis ([place]) 11/22/2017  . Perirectal abscess 11/22/2017  . Cellulitis and abscess of buttock 11/20/2017  . S/P coronary artery stent placement   . Ex-smoker 06/21/2017  . 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 Long term (current) use of aspirin 06/21/2017  . Tobacco abuse 05/31/2017  . ST elevation myocardial infarction (STEMI), subsequent episode of care ([place]) 05/30/2017  . Diabetes mellitus ([place]) 11/29/2013  . Hyperlipidemia 11/29/2013  . Essential hypertension, benign 11/29/2013  . Erectile dysfunction 11/29/2013  . Type 2 diabetes mellitus, with long-term current use of insulin ([place]) 11/29/2013    Past Surgical History:  Procedure Laterality Date  . CORONARY STENT PLACEMENT    . INCISION AND DRAINAGE PERIRECTAL ABSCESS Left 11/20/2017   Procedure: IRRIGATION AND DEBRIDEMENT PERIRECTAL ABSCESS;  Surgeon: Stark Klein, MD;  Location: [facility];  Service: General;  Laterality: Left;     Current Outpatient Medications:  .  aspirin EC 81 MG tablet, Take 1 tablet (81 mg total) by mouth daily., Disp: 30 tablet, Rfl: 11 .  atorvastatin (LIPITOR) 80 MG tablet, Take 1 tablet (80 mg total) by  mouth daily., Disp: 30 tablet, Rfl: 5 .  Insulin Isophane & Regular Human (NOVOLIN 70/30 FLEXPEN) (70-30) 100 UNIT/ML PEN, Inject 13 Units into the skin 2 (two) times daily., Disp: 15 mL, Rfl: 5 .  lisinopril (PRINIVIL,ZESTRIL) 5 MG tablet, Take 1 tablet (5 mg total) by mouth daily., Disp: 30 tablet, Rfl: 5 .  metoprolol tartrate (LOPRESSOR) 25 MG tablet, TAKE 1 & 1/2 (ONE & ONE-HALF) TABLETS BY MOUTH TWICE DAILY, Disp: 90 tablet, Rfl: 5 .  ticagrelor (BRILINTA) 90 MG TABS tablet, Take 90 mg by mouth 2 (two) times daily., Disp: , Rfl:  .  tadalafil (CIALIS) 10 MG tablet, 1/2-1 tablet prn (Patient not taking: Reported on 10/10/2018), Disp: 90 tablet, Rfl: 0  No Known Allergies  Social History   Occupational History  . Not on file  Tobacco Use  . Smoking status: Former Smoker    Years: 20.00    Types: Cigarettes  . Smokeless tobacco: Never Used  Substance and Sexual Activity  . Alcohol use: Yes    Comment: socially - beer  . Drug use: No  . Sexual activity: Not on file    Family History  Problem Relation Age of Onset  . Diabetes Mother        complications of diabetes  . Heart disease Father        died of MI, died age 54yo  . Hypertension Father   . Multiple sclerosis Sister   . Cancer Brother  brain tumor  . Diabetes Brother   . Lupus Sister   . Lupus Sister   . Stroke Neg Hx      There is no immunization history on file for this patient.   Review of systems: Positive Findings in bold print.  Constitutional:  chills, fatigue, fever, sweats, weight change Communication: Optometrist, sign Ecologist, hand writing, iPad/Android device Head: headaches, head injury Eyes: changes in vision, eye pain, glaucoma, cataracts, macular degeneration, diplopia, glare,  light sensitivity, eyeglasses or contacts, blindness Ears nose mouth throat: Hard of hearing, ringing in ears, deaf, sign language,  vertigo,   nosebleeds,  rhinitis,  cold sores, snoring, swollen  glands Cardiovascular: HTN, edema, arrhythmia, pacemaker in place, defibrillator in place,  chest pain/tightness, chronic anticoagulation, blood clot, heart failure, MI Peripheral Vascular: leg cramps, varicose veins, blood clots, lymphedema Respiratory:  difficulty breathing, denies congestion, SOB, wheezing, cough, emphysema Gastrointestinal: change in appetite or weight, abdominal pain, constipation, diarrhea, nausea, vomiting, vomiting blood, change in bowel habits, abdominal pain, jaundice, rectal bleeding, hemorrhoids, Genitourinary:  nocturia,  pain on urination,  blood in urine, Foley catheter, urinary urgency Musculoskeletal: uses mobility aid,  cramping, stiff joints, painful joints, decreased joint motion, fractures, OA, gout Skin: +changes in toenails, color change, dryness, itching, mole changes,  rash  Neurological: headaches, numbness in feet, paresthesias in feet, burning in feet, fainting,  seizures, change in speech. denies headaches, memory problems/poor historian, cerebral palsy, weakness, paralysis Endocrine: diabetes, hypothyroidism, hyperthyroidism,  goiter, dry mouth, flushing, heat intolerance,  cold intolerance,  excessive thirst, denies polyuria,  nocturia Hematological:  easy bleeding, excessive bleeding, easy bruising, enlarged lymph nodes, on long term blood thinner, history of past transusions Allergy/immunological:  hives, eczema, frequent infections, multiple drug allergies, seasonal allergies, transplant recipient Psychiatric:  anxiety, depression, mood disorder, suicidal ideations, hallucinations   Objective: Vascular Examination: Capillary refill time immediate x 10 digits  Dorsalis pedis 2/4 bilaterally and  posterior tibial pulses 2/4 b/l No digital hair x 10 digits Skin temperature gradient WNL b/l  Dermatological Examination: Skin with normal turgor, texture and tone b/l  Toenails 1-5 b/l discolored, thick, dystrophic with subungual debris and pain  with palpation to nailbeds due to thickness of nails.  Hyperkeratotic lesion noted plantar aspect of both heels bilaterally.  No erythema, no edema, no drainage, no flocculence noted.  Musculoskeletal: Muscle strength 5/5 to all LE muscle groups  Neurological: Sensation intact with 10 gram monofilament  Vibratory sensation intact.  He does relate subjective symptoms of neuropathy  Assessment: 1. Painful onychomycosis toenails 1-5 b/l  2. Calluses plantar aspect of both heels bilaterally 3. NIDDM with subjective neuropathy  Plan: 1. Educated patient on diabetic foot care principles. Literature dispensed on today. 2. Toenails 1-5 b/l were debrided in length and girth without iatrogenic bleeding. 3. Calluses pared bilateral heels without incident. 4. Patient to continue soft, supportive shoe gear 5. Patient to report any pedal injuries to medical professional immediately. 6. Follow up 3 months. Patient/POA to call should there be a concern in the interim.

## 2018-10-31 NOTE — Progress Notes (Deleted)
Cardiology Office Note:    Date:  10/31/2018   ID:  Tommy Jimenez, DOB March 02, 1966, MRN [id number]  PCP:  Tommy Hurl, PA-C  Cardiologist:  No primary care provider on file.   Referring MD: Tommy Mccreedy, MD   No chief complaint on file.   History of Present Illness:    Tommy Jimenez is a 53 y.o. male with a hx of prior MI treated with stent referred by Dr. Vista Jimenez.  Past Medical History:  Diagnosis Date  . Coronary artery disease    stent placed  . DM (diabetes mellitus) ([place]) 1996  . DVT (deep venous thrombosis) ([facility])    right leg   . Erectile dysfunction   . HTN (hypertension)   . Myocardial infarction ([place])   . Peripheral vascular disease [facility])     Past Surgical History:  Procedure Laterality Date  . CORONARY STENT PLACEMENT    . INCISION AND DRAINAGE PERIRECTAL ABSCESS Left 11/20/2017   Procedure: IRRIGATION AND DEBRIDEMENT PERIRECTAL ABSCESS;  Surgeon: Tommy Klein, MD;  Location: [facility];  Service: General;  Laterality: Left;    Current Medications: No outpatient medications have been marked as taking for the 11/01/18 encounter (Appointment) with Tommy Crome, MD.     Allergies:   Patient has no known allergies.   Social History   Socioeconomic History  . Marital status: Legally Separated    Spouse name: Not on file  . Number of children: Not on file  . Years of education: Not on file  . Highest education level: Not on file  Occupational History  . Not on file  Social Needs  . Financial resource strain: Not on file  . Food insecurity:    Worry: Not on file    Inability: Not on file  . Transportation needs:    Medical: Not on file    Non-medical: Not on file  Tobacco Use  . Smoking status: Former Smoker    Years: 20.00    Types: Cigarettes  . Smokeless tobacco: Never Used  Substance and Sexual Activity  . Alcohol use: Yes    Comment: socially - beer  . Drug use: No  . Sexual activity: Not on file  Lifestyle  . Physical  activity:    Days per week: Not on file    Minutes per session: Not on file  . Stress: Not on file  Relationships  . Social connections:    Talks on phone: Not on file    Gets together: Not on file    Attends religious service: Not on file    Active member of club or organization: Not on file    Attends meetings of clubs or organizations: Not on file    Relationship status: Not on file  Other Topics Concern  . Not on file  Social History Narrative  . Not on file     Family History: The patient's family history includes Cancer in his brother; Diabetes in his brother and mother; Heart disease in his father; Hypertension in his father; Lupus in his sister and sister; Multiple sclerosis in his sister. There is no history of Stroke.  ROS:   Please see the history of present illness.    *** All other systems reviewed and are negative.  EKGs/Labs/Other Studies Reviewed:    The following studies were reviewed today: ***  EKG:  EKG ***  Recent Labs: 11/22/2017: Magnesium 2.0 05/10/2018: ALT 15; BUN 10; Creatinine, Ser 1.04; Hemoglobin 14.9; Platelets 331; Potassium 4.5; Sodium  138  Recent Lipid Panel    Component Value Date/Time   CHOL 184 05/10/2018 1342   TRIG 75 05/10/2018 1342   HDL 54 05/10/2018 1342   CHOLHDL 3.4 05/10/2018 1342   CHOLHDL 4.3 11/29/2013 1130   VLDL 18 11/29/2013 1130   LDLCALC 115 (H) 05/10/2018 1342    Physical Exam:    VS:  There were no vitals taken for this visit.    Wt Readings from Last 3 Encounters:  05/10/18 166 lb 6.4 oz (75.5 kg)  12/12/17 163 lb 12.8 oz (74.3 kg)  11/20/17 167 lb (75.8 kg)     GEN: ***. No acute distress HEENT: Normal NECK: No JVD. LYMPHATICS: No lymphadenopathy CARDIAC: ***RRR.  *** murmur, ***gallop, ***edema VASCULAR: *** Pulses, *** bruits RESPIRATORY:  Clear to auscultation without rales, wheezing or rhonchi  ABDOMEN: Soft, non-tender, non-distended, No pulsatile mass, MUSCULOSKELETAL: No deformity  SKIN:  Warm and dry NEUROLOGIC:  Alert and oriented x 3 PSYCHIATRIC:  Normal affect   ASSESSMENT:    1. Type 2 diabetes mellitus with other skin complication, without long-term current use of insulin ([place])   2. Hyperlipidemia, unspecified hyperlipidemia type   3. Essential hypertension, benign   4. S/P coronary artery stent placement   5. Ischemic dilated cardiomyopathy ([place])   6. Tobacco abuse   7. Type 2 diabetes mellitus with other circulatory complication, with long-term current use of insulin ([facility])    PLAN:    In order of problems listed above:  1. ***   Medication Adjustments/Labs and Tests Ordered: Current medicines are reviewed at length with the patient today.  Concerns regarding medicines are outlined above.  No orders of the defined types were placed in this encounter.  No orders of the defined types were placed in this encounter.   There are no Patient Instructions on file for this visit.   Signed, Tommy Grooms, MD  10/31/2018 9:51 PM    [place]

## 2018-11-01 ENCOUNTER — Telehealth: Payer: Self-pay

## 2018-11-01 ENCOUNTER — Ambulatory Visit: Payer: Self-pay | Admitting: Interventional Cardiology

## 2018-11-01 NOTE — Telephone Encounter (Signed)
Patient meets inclusion criteria for current pharmacy residency project to initiate SGLT2i or GLP1-RA therapy for cardiovascular benefit due to current diagnosis of ASCVD and DM.  Patient did not show for scheduled appointment with Dr. Tamala Julian on 11/01/18. Will follow up if patient reschedules appointment.   Patient would likely benefit from initiation of Victoza titrated to 1.8mg  as patient now has insurance. Patient was previously on Trulicity with no mention of why discontinued, possibly due to previous uninsured state. Patient currently not on metformin per med list and may also benefit from continuing previous regimen of metformin.   Relevant labs and dates: Lab Results  Component Value Date   HGBA1C 11.5 (A) 05/11/2018   Lab Results  Component Value Date   CREATININE 1.04 05/10/2018   CREATININE 0.83 11/22/2017   CREATININE 0.92 11/22/2017   CrCl cannot be calculated (Patient's most recent lab result is older than the maximum 21 days allowed.). Wt Readings from Last 1 Encounters:  05/10/18 166 lb 6.4 oz (75.5 kg)   BP Readings from Last 1 Encounters:  10/10/18 126/82    Metformin use: Robbie Lis, Pharm D PGY1 Pharmacy Resident  11/01/2018      11:11 AM

## 2018-11-02 ENCOUNTER — Encounter: Payer: Self-pay | Admitting: Interventional Cardiology

## 2018-11-07 ENCOUNTER — Encounter: Payer: BLUE CROSS/BLUE SHIELD | Attending: Internal Medicine | Admitting: *Deleted

## 2018-11-07 DIAGNOSIS — Z794 Long term (current) use of insulin: Secondary | ICD-10-CM | POA: Insufficient documentation

## 2018-11-07 DIAGNOSIS — E1159 Type 2 diabetes mellitus with other circulatory complications: Secondary | ICD-10-CM | POA: Insufficient documentation

## 2018-11-07 NOTE — Patient Instructions (Signed)
Plan:  Aim for 3-4 Carb Choices per meal (45-60 grams)  Aim for 0-2 Carbs per snack if hungry  Include protein in moderation with your meals and snacks Consider reading food labels for Total Carbohydrate of foods Continue your activity level by walking for 60 minutes 2-3 days a week as tolerated GREAT JOB!  Consider checking BG at alternate times per day including after some meals and after exercise to see how those affect your BG  Continue taking medication as directed by MD Ask MD to check on switching Novolin 70/30 to Humulin 70/30 as I believe your insurance will cover Humulin better for you.

## 2018-11-09 NOTE — Progress Notes (Signed)
Diabetes Self-Management Education  Visit Type: First/Initial  Appt. Start Time: 1400 Appt. End Time: 1610  11/09/2018  [name], identified by name and date of birth, is a 53 y.o. male with a diagnosis of Diabetes: Type 2. He states history of Diabetes for over 20 years. His insulin is currently Novolin 70/30 @ 35 units twice a day. He states he had to pay for it but not his other medications at the pharmacy. He states he does not know how the insulin works.  ASSESSMENT  There were no vitals taken for this visit. There is no height or weight on file to calculate BMI.  Diabetes Self-Management Education - 11/07/18 1402      Visit Information   Visit Type  First/Initial      Initial Visit   Diabetes Type  Type 2    Are you currently following a meal plan?  No    Are you taking your medications as prescribed?  Yes    Date Diagnosed  [place]   How would you rate your overall health?  Fair      Psychosocial Assessment   Patient Belief/Attitude about Diabetes  Afraid    Self-care barriers  None    Self-management support  Doctor's office    Other persons present  Patient    Patient Concerns  Nutrition/Meal planning;Glycemic Control    Special Needs  None    Preferred Learning Style  No preference indicated    Learning Readiness  Change in progress    How often do you need to have someone help you when you read instructions, pamphlets, or other written materials from your doctor or pharmacy?  1 - Never    What is the last grade level you completed in school?  12      Pre-Education Assessment   Patient understands the diabetes disease and treatment process.  Needs Review    Patient understands incorporating nutritional management into lifestyle.  Needs Review    Patient undertands incorporating physical activity into lifestyle.  Needs Review    Patient understands using medications safely.  Needs Review    Patient understands monitoring blood glucose,  interpreting and using results  Demonstrates understanding / competency    Patient understands prevention, detection, and treatment of acute complications.  Demonstrates understanding / competency    Patient understands prevention, detection, and treatment of chronic complications.  Demonstrates understanding / competency    Patient understands how to develop strategies to address psychosocial issues.  Needs Review    Patient understands how to develop strategies to promote health/change behavior.  Needs Review      Complications   Last HgB A1C per patient/outside source  12.5 %    How often do you check your blood sugar?  1-2 times/day    Fasting Blood glucose range (mg/dL)  130-179;180-200    Postprandial Blood glucose range (mg/dL)  >200    Number of hypoglycemic episodes per month  0    Have you had a dilated eye exam in the past 12 months?  No    Have you had a dental exam in the past 12 months?  No    Are you checking your feet?  Yes    How many days per week are you checking your feet?  3      Dietary Intake   Breakfast  regular oatmeal    Lunch  spinach salad with baked chicken, onion, peppers, cheese, 1000  [employer] (afternoon)  peanuts, chips occasionally    Dinner  lean meat, vegetables, occasionally rice or potatoes    Snack (evening)  peanuts, occasionally some gummy bears, pickles    Beverage(s)  coffee x 2 in AM only, water, beer twice a week      Exercise   Exercise Type  ADL's;Light (walking / raking leaves)    How many days per week to you exercise?  3    How many minutes per day do you exercise?  60    Total minutes per week of exercise  180      Patient Education   Previous Diabetes Education  No    Disease state   Factors that contribute to the development of diabetes;Explored patient's options for treatment of their diabetes    Nutrition management   Role of diet in the treatment of diabetes and the relationship between the three main  macronutrients and blood glucose level;Food label reading, portion sizes and measuring food.;Carbohydrate counting;Reviewed blood glucose goals for pre and post meals and how to evaluate the patients' food intake on their blood glucose level.    Physical activity and exercise   Role of exercise on diabetes management, blood pressure control and cardiac health.;Identified with patient nutritional and/or medication changes necessary with exercise.    Medications  Reviewed patients medication for diabetes, action, purpose, timing of dose and side effects.;Other (comment)   discussed insurance coverage for different brands of insulin   Monitoring  Purpose and frequency of SMBG.;Identified appropriate SMBG and/or A1C goals.    Acute complications  Taught treatment of hypoglycemia - the 15 rule.    Chronic complications  Relationship between chronic complications and blood glucose control    Psychosocial adjustment  Role of stress on diabetes      Individualized Goals (developed by patient)   Nutrition  General guidelines for healthy choices and portions discussed    Physical Activity  Exercise 3-5 times per week    Medications  take my medication as prescribed    Monitoring   test blood glucose pre and post meals as discussed      Post-Education Assessment   Patient understands the diabetes disease and treatment process.  Demonstrates understanding / competency    Patient understands incorporating nutritional management into lifestyle.  Demonstrates understanding / competency    Patient undertands incorporating physical activity into lifestyle.  Demonstrates understanding / competency    Patient understands using medications safely.  Demonstrates understanding / competency    Patient understands monitoring blood glucose, interpreting and using results  Demonstrates understanding / competency    Patient understands prevention, detection, and treatment of acute complications.  Demonstrates  understanding / competency    Patient understands prevention, detection, and treatment of chronic complications.  Demonstrates understanding / competency    Patient understands how to develop strategies to address psychosocial issues.  Demonstrates understanding / competency    Patient understands how to develop strategies to promote health/change behavior.  Demonstrates understanding / competency      Outcomes   Expected Outcomes  Demonstrated interest in learning. Expect positive outcomes    Future DMSE  PRN    Program Status  Completed       Individualized Plan for Diabetes Self-Management Training:   Learning Objective:  Patient will have a greater understanding of diabetes self-management. Patient education plan is to attend individual and/or group sessions per assessed needs and concerns.   Plan:   Patient Instructions  Plan:  Aim for 3-4 Carb Choices per meal (45-60 grams)  Aim for 0-2 Carbs per snack if hungry  Include protein in moderation with your meals and snacks Consider reading food labels for Total Carbohydrate of foods Continue your activity level by walking for 60 minutes 2-3 days a week as tolerated GREAT JOB!  Consider checking BG at alternate times per day including after some meals and after exercise to see how those affect your BG  Continue taking medication as directed by MD Ask MD to check on switching Novolin 70/30 to Humulin 70/30 as I believe your insurance will cover Humulin better for you.  Expected Outcomes:  Demonstrated interest in learning. Expect positive outcomes  Education material provided: Food label handouts, A1C conversion sheet, Meal plan card and Carbohydrate counting sheet, Insulin Action handout  If problems or questions, patient to contact team via:  Phone  Future DSME appointment: PRN

## 2018-12-25 ENCOUNTER — Telehealth: Payer: Self-pay | Admitting: Medical

## 2018-12-25 NOTE — Telephone Encounter (Signed)
Needs diabetes med check virtual

## 2018-12-26 NOTE — Telephone Encounter (Signed)
Patient has the [place] and wll no longer be coming here.

## 2018-12-26 NOTE — Telephone Encounter (Signed)
Thanks for checking 

## 2019-01-09 ENCOUNTER — Ambulatory Visit: Payer: BLUE CROSS/BLUE SHIELD | Admitting: Podiatry

## 2019-01-19 ENCOUNTER — Other Ambulatory Visit: Payer: Self-pay | Admitting: Medical

## 2019-01-21 NOTE — Telephone Encounter (Signed)
Patient has [employer] and is no longer going to be coming here.   Is this ok to refill?

## 2019-02-17 ENCOUNTER — Other Ambulatory Visit: Payer: Self-pay | Admitting: Medical

## 2019-05-08 ENCOUNTER — Emergency Department (HOSPITAL_COMMUNITY)
Admission: EM | Admit: 2019-05-08 | Discharge: 2019-05-08 | Disposition: A | Discharge status: Home / Self Care | Payer: BLUE CROSS/BLUE SHIELD | Attending: Emergency Medicine | Admitting: Emergency Medicine

## 2019-05-08 ENCOUNTER — Other Ambulatory Visit: Payer: Self-pay

## 2019-05-08 ENCOUNTER — Encounter (HOSPITAL_COMMUNITY): Payer: Self-pay

## 2019-05-08 ENCOUNTER — Emergency Department (HOSPITAL_COMMUNITY): Payer: BLUE CROSS/BLUE SHIELD

## 2019-05-08 ENCOUNTER — Emergency Department (HOSPITAL_BASED_OUTPATIENT_CLINIC_OR_DEPARTMENT_OTHER)
Admit: 2019-05-08 | Discharge: 2019-05-08 | Disposition: A | Discharge status: Home / Self Care | Payer: BLUE CROSS/BLUE SHIELD | Attending: Emergency Medicine | Admitting: Emergency Medicine

## 2019-05-08 DIAGNOSIS — E119 Type 2 diabetes mellitus without complications: Secondary | ICD-10-CM | POA: Diagnosis not present

## 2019-05-08 DIAGNOSIS — Z955 Presence of coronary angioplasty implant and graft: Secondary | ICD-10-CM | POA: Diagnosis not present

## 2019-05-08 DIAGNOSIS — Z87891 Personal history of nicotine dependence: Secondary | ICD-10-CM | POA: Insufficient documentation

## 2019-05-08 DIAGNOSIS — I252 Old myocardial infarction: Secondary | ICD-10-CM | POA: Insufficient documentation

## 2019-05-08 DIAGNOSIS — R0602 Shortness of breath: Secondary | ICD-10-CM

## 2019-05-08 DIAGNOSIS — R252 Cramp and spasm: Secondary | ICD-10-CM | POA: Insufficient documentation

## 2019-05-08 DIAGNOSIS — R52 Pain, unspecified: Secondary | ICD-10-CM | POA: Diagnosis not present

## 2019-05-08 DIAGNOSIS — I2510 Atherosclerotic heart disease of native coronary artery without angina pectoris: Secondary | ICD-10-CM | POA: Diagnosis not present

## 2019-05-08 DIAGNOSIS — Z79899 Other long term (current) drug therapy: Secondary | ICD-10-CM | POA: Insufficient documentation

## 2019-05-08 DIAGNOSIS — M79661 Pain in right lower leg: Secondary | ICD-10-CM | POA: Diagnosis present

## 2019-05-08 DIAGNOSIS — M79662 Pain in left lower leg: Secondary | ICD-10-CM | POA: Diagnosis not present

## 2019-05-08 DIAGNOSIS — I10 Essential (primary) hypertension: Secondary | ICD-10-CM | POA: Diagnosis not present

## 2019-05-08 DIAGNOSIS — Z7982 Long term (current) use of aspirin: Secondary | ICD-10-CM | POA: Insufficient documentation

## 2019-05-08 DIAGNOSIS — Z760 Encounter for issue of repeat prescription: Secondary | ICD-10-CM

## 2019-05-08 LAB — CBC WITH DIFFERENTIAL/PLATELET
Abs Immature Granulocytes: 0.04 10*3/uL (ref 0.00–0.07)
Basophils Absolute: 0.1 10*3/uL (ref 0.0–0.1)
Basophils Relative: 1 %
Eosinophils Absolute: 0.2 10*3/uL (ref 0.0–0.5)
Eosinophils Relative: 2 %
HCT: 43.7 % (ref 39.0–52.0)
Hemoglobin: 14 g/dL (ref 13.0–17.0)
Immature Granulocytes: 0 %
Lymphocytes Relative: 33 %
Lymphs Abs: 3.3 10*3/uL (ref 0.7–4.0)
MCH: 28.2 pg (ref 26.0–34.0)
MCHC: 32 g/dL (ref 30.0–36.0)
MCV: 87.9 fL (ref 80.0–100.0)
Monocytes Absolute: 0.8 10*3/uL (ref 0.1–1.0)
Monocytes Relative: 8 %
Neutro Abs: 5.6 10*3/uL (ref 1.7–7.7)
Neutrophils Relative %: 56 %
Platelets: 376 10*3/uL (ref 150–400)
RBC: 4.97 MIL/uL (ref 4.22–5.81)
RDW: 13.4 % (ref 11.5–15.5)
WBC: 10.2 10*3/uL (ref 4.0–10.5)
nRBC: 0 % (ref 0.0–0.2)

## 2019-05-08 LAB — BASIC METABOLIC PANEL
Anion gap: 11 (ref 5–15)
BUN: 11 mg/dL (ref 6–20)
CO2: 17 mmol/L — ABNORMAL LOW (ref 22–32)
Calcium: 8.5 mg/dL — ABNORMAL LOW (ref 8.9–10.3)
Chloride: 108 mmol/L (ref 98–111)
Creatinine, Ser: 0.79 mg/dL (ref 0.61–1.24)
GFR calc Af Amer: >60 mL/min (ref >60)
GFR calc non Af Amer: >60 mL/min (ref >60)
Glucose, Bld: 281 mg/dL — ABNORMAL HIGH (ref 70–99)
Potassium: 3.9 mmol/L (ref 3.5–5.1)
Sodium: 136 mmol/L (ref 135–145)

## 2019-05-08 LAB — MAGNESIUM: Magnesium: 2 mg/dL (ref 1.7–2.4)

## 2019-05-08 LAB — TROPONIN I (HIGH SENSITIVITY)
Troponin I (High Sensitivity): 6 ng/L (ref <18)
Troponin I (High Sensitivity): 6 ng/L (ref <18)

## 2019-05-08 LAB — BRAIN NATRIURETIC PEPTIDE: B Natriuretic Peptide: 55.2 pg/mL (ref 0.0–100.0)

## 2019-05-08 MED ORDER — NOVOLIN 70/30 FLEXPEN (70-30) 100 UNIT/ML ~~LOC~~ SUPN: 35.0000 [IU] | PEN_INJECTOR | Freq: Two times a day (BID) | SUBCUTANEOUS | 0 refills | Status: DC

## 2019-05-08 MED ORDER — ATORVASTATIN CALCIUM 80 MG PO TABS: 80.0000 mg | ORAL_TABLET | Freq: Every day | ORAL | 0 refills | Status: DC

## 2019-05-08 MED ORDER — LISINOPRIL 10 MG PO TABS
5.0000 mg | ORAL_TABLET | Freq: Once | ORAL | Status: AC
  Administered 2019-05-08: 5 mg via ORAL
  Filled 2019-05-08: qty 1

## 2019-05-08 MED ORDER — LISINOPRIL 5 MG PO TABS: 5.0000 mg | ORAL_TABLET | Freq: Every day | ORAL | 0 refills | Status: DC

## 2019-05-08 MED ORDER — METOPROLOL TARTRATE 25 MG PO TABS
37.5000 mg | ORAL_TABLET | Freq: Once | ORAL | Status: DC
  Filled 2019-05-08: qty 2

## 2019-05-08 MED ORDER — METOPROLOL TARTRATE 25 MG PO TABS: ORAL_TABLET | ORAL | 0 refills | Status: DC

## 2019-05-08 NOTE — ED Notes (Signed)
Patient transported to X-ray 

## 2019-05-08 NOTE — ED Triage Notes (Signed)
Patient c/o bilateral leg cramps x 2 weeks. Patient denies any swelling of lower extremities.

## 2019-05-08 NOTE — Progress Notes (Signed)
Bilateral lower extremity venous duplex has been completed. Preliminary results can be found in CV Proc through chart review.  Results were given to Eliezer Mccoy PA.  05/08/19 11:00 AM Carlos Levering RVT

## 2019-05-08 NOTE — Discharge Instructions (Signed)
Your work-up was largely reassuring today.  Resume taking your medications as prescribed.  Please follow-up with your doctor for further evaluation and treatment of your ongoing symptoms as soon as possible.  Please return the emergency department if you develop any new or worsening symptoms.

## 2019-05-08 NOTE — ED Provider Notes (Signed)
[facility] DEPT Provider Note   CSN: [id number] Arrival date & time: 05/08/19  0913    History   Chief Complaint Chief Complaint  Patient presents with  . leg cramps  . Medication Refill    HPI Tommy Jimenez is a 53 y.o. male with history of CAD with stents, hypertension, DVT, diabetes, PVD who presents with bilateral calf pain that has been progressively worsening over the past few months.  It is worse when he walks and presses on his calf.  He is also been having some pain at night.  He has had worsening shortness of breath on exertion.  He denies any chest pain, abdominal pain, nausea, vomiting, urinary symptoms.  He has not seen his PCP in a while due to [place].  He is out of several of his medications including anti-hypertensive medications.     HPI  Past Medical History:  Diagnosis Date  . Coronary artery disease    stent placed  . DM (diabetes mellitus) ([place]) 1996  . DVT (deep venous thrombosis) ([facility])    right leg   . Erectile dysfunction   . HTN (hypertension)   . Myocardial infarction ([place])   . Peripheral vascular disease [facility])     Patient Active Problem List   Diagnosis Date Noted  . Heart disease 12/12/2017  . Noncompliance 12/12/2017  . Sepsis ([place]) 11/22/2017  . Perirectal abscess 11/22/2017  . Cellulitis and abscess of buttock 11/20/2017  . S/P coronary artery stent placement   . Ex-smoker 06/21/2017  . Ischemic dilated cardiomyopathy ([place]) 06/21/2017  . Long term (current) use of aspirin 06/21/2017  . Tobacco abuse 05/31/2017  . ST elevation myocardial infarction (STEMI), subsequent episode of care ([place]) 05/30/2017  . Diabetes mellitus ([place]) 11/29/2013  . Hyperlipidemia 11/29/2013  . Essential hypertension, benign 11/29/2013  . Erectile dysfunction 11/29/2013  . Type 2 diabetes mellitus, with long-term current use of insulin ([place]) 11/29/2013    Past Surgical History:  Procedure Laterality Date  . CORONARY STENT  PLACEMENT    . INCISION AND DRAINAGE PERIRECTAL ABSCESS Left 11/20/2017   Procedure: IRRIGATION AND DEBRIDEMENT PERIRECTAL ABSCESS;  Surgeon: Stark Klein, MD;  Location: [facility] ORS;  Service: General;  Laterality: Left;        Home Medications    Prior to Admission medications   Medication Sig Start Date End Date Taking? Authorizing Provider  aspirin EC 81 MG tablet Take 1 tablet (81 mg total) by mouth daily. 05/10/18   Tysinger, Camelia Eng, PA-C  atorvastatin (LIPITOR) 80 MG tablet Take 1 tablet (80 mg total) by mouth daily. 05/08/19   Zaul Hubers, Bea Graff, PA-C  Insulin Isophane & Regular Human (NOVOLIN 70/30 FLEXPEN) (70-30) 100 UNIT/ML PEN Inject 35 Units into the skin 2 (two) times daily. 05/08/19   Kiyoto Slomski, Bea Graff, PA-C  lisinopril (ZESTRIL) 5 MG tablet Take 1 tablet (5 mg total) by mouth daily. 05/08/19   Chauntae Hults, Bea Graff, PA-C  metoprolol tartrate (LOPRESSOR) 25 MG tablet TAKE 1 & 1/2 (ONE & ONE-HALF) TABLETS BY MOUTH TWICE DAILY 05/08/19   Quinterious Walraven, Bea Graff, PA-C  tadalafil (CIALIS) 10 MG tablet 1/2-1 tablet prn Patient not taking: Reported on 10/10/2018 12/28/17   Tysinger, Camelia Eng, PA-C  ticagrelor (BRILINTA) 90 MG TABS tablet Take 90 mg by mouth 2 (two) times daily.    [provider]    Family History Family History  Problem Relation Age of Onset  . Diabetes Mother        complications of  diabetes  . Heart disease Father        died of MI, died age 48yo  . Hypertension Father   . Multiple sclerosis Sister   . Cancer Brother        brain tumor  . Diabetes Brother   . Lupus Sister   . Lupus Sister   . Stroke Neg Hx     Social History Social History   Tobacco Use  . Smoking status: Former Smoker    Years: 20.00    Types: Cigarettes  . Smokeless tobacco: Never Used  Substance Use Topics  . Alcohol use: Yes    Comment: socially - beer  . Drug use: No     Allergies   Patient has no known allergies.   Review of Systems Review of Systems  Constitutional:  Negative for chills, diaphoresis and fever.  HENT: Negative for facial swelling and sore throat.   Respiratory: Positive for shortness of breath (on exertion only).   Cardiovascular: Negative for chest pain.  Gastrointestinal: Negative for abdominal pain, nausea and vomiting.  Genitourinary: Negative for dysuria.  Musculoskeletal: Positive for myalgias. Negative for back pain.  Skin: Negative for rash and wound.  Neurological: Negative for headaches.  Psychiatric/Behavioral: The patient is not nervous/anxious.      Physical Exam Updated Vital Signs BP 140/80   Pulse 72   Temp 98.1 F (36.7 C) (Oral)   Resp 14   Ht 5\' 8"  (1.727 m)   Wt 77.1 kg   SpO2 96%   BMI 25.85 kg/m   Physical Exam Vitals signs and nursing note reviewed.  Constitutional:      General: He is not in acute distress.    Appearance: He is well-developed. He is not diaphoretic.  HENT:     Head: Normocephalic and atraumatic.     Mouth/Throat:     Pharynx: No oropharyngeal exudate.  Eyes:     General: No scleral icterus.       Right eye: No discharge.        Left eye: No discharge.     Conjunctiva/sclera: Conjunctivae normal.     Pupils: Pupils are equal, round, and reactive to light.  Neck:     Musculoskeletal: Normal range of motion and neck supple.     Thyroid: No thyromegaly.  Cardiovascular:     Rate and Rhythm: Normal rate and regular rhythm.     Heart sounds: Normal heart sounds. No murmur. No friction rub. No gallop.   Pulmonary:     Effort: Pulmonary effort is normal. No respiratory distress.     Breath sounds: Normal breath sounds. No stridor. No wheezing or rales.  Abdominal:     General: Bowel sounds are normal. There is no distension.     Palpations: Abdomen is soft.     Tenderness: There is no abdominal tenderness. There is no guarding or rebound.  Musculoskeletal:     Right lower leg: No edema.     Left lower leg: No edema.     Comments: Bilateral calf tenderness; no significant  edema or erythema noted  Lymphadenopathy:     Cervical: No cervical adenopathy.  Skin:    General: Skin is warm and dry.     Coloration: Skin is not pale.     Findings: No rash.  Neurological:     Mental Status: He is alert.     Coordination: Coordination normal.      ED Treatments / Results  Labs (all labs ordered are listed,  but only abnormal results are displayed) Labs Reviewed  BASIC METABOLIC PANEL - Abnormal; Notable for the following components:      Result Value   CO2 17 (*)    Glucose, Bld 281 (*)    Calcium 8.5 (*)    All other components within normal limits  CBC WITH DIFFERENTIAL/PLATELET  MAGNESIUM  BRAIN NATRIURETIC PEPTIDE  TROPONIN I (HIGH SENSITIVITY)  TROPONIN I (HIGH SENSITIVITY)    EKG EKG Interpretation  Date/Time:  Wednesday May 08 2019 10:28:07 EDT Ventricular Rate:  68 PR Interval:    QRS Duration: 86 QT Interval:  404 QTC Calculation: 430 R Axis:   44 Text Interpretation:  Sinus rhythm Anterior infarct, old Confirmed by Davonna Belling [phone]) on 05/08/2019 10:30:17 AM Also confirmed by Davonna Belling [phone]), editor Philomena Doheny [phone])  on 05/08/2019 11:30:03 AM   Radiology Dg Chest 2 View  Result Date: 05/08/2019 CLINICAL DATA:  53 year old male with shortness of breath. EXAM: CHEST - 2 VIEW COMPARISON:  None. FINDINGS: There is no focal consolidation, pleural effusion, or pneumothorax. Apparent mild emphysema. The cardiac silhouette is within normal limits. No acute osseous pathology. IMPRESSION: No active cardiopulmonary disease. Electronically Signed   By: Anner Crete M.D.   On: 05/08/2019 10:43   Vas [place] Lower Extremity Venous (dvt) (only [facility] & Wl 7a-7p)  Result Date: 05/08/2019  Lower Venous Study Indications: Pain.  Risk Factors: None identified. Comparison Study: No prior studies. Performing Technologist: Oliver Hum RVT  Examination Guidelines: A complete evaluation includes B-mode imaging, spectral Doppler, color  Doppler, and power Doppler as needed of all accessible portions of each vessel. Bilateral testing is considered an integral part of a complete examination. Limited examinations for reoccurring indications may be performed as noted.  +---------+---------------+---------+-----------+----------+--------------+ RIGHT    CompressibilityPhasicitySpontaneityPropertiesThrombus Aging +---------+---------------+---------+-----------+----------+--------------+ CFV      Full           Yes      Yes                                 +---------+---------------+---------+-----------+----------+--------------+ SFJ      Full                                                        +---------+---------------+---------+-----------+----------+--------------+ FV Prox  Full                                                        +---------+---------------+---------+-----------+----------+--------------+ FV Mid   Full                                                        +---------+---------------+---------+-----------+----------+--------------+ FV DistalFull                                                        +---------+---------------+---------+-----------+----------+--------------+  PFV      Full                                                        +---------+---------------+---------+-----------+----------+--------------+ POP      Full           Yes      Yes                                 +---------+---------------+---------+-----------+----------+--------------+ PTV      Full                                                        +---------+---------------+---------+-----------+----------+--------------+ PERO     Full                                                        +---------+---------------+---------+-----------+----------+--------------+   +---------+---------------+---------+-----------+----------+--------------+ LEFT      CompressibilityPhasicitySpontaneityPropertiesThrombus Aging +---------+---------------+---------+-----------+----------+--------------+ CFV      Full           Yes      Yes                                 +---------+---------------+---------+-----------+----------+--------------+ SFJ      Full                                                        +---------+---------------+---------+-----------+----------+--------------+ FV Prox  Full                                                        +---------+---------------+---------+-----------+----------+--------------+ FV Mid   Full                                                        +---------+---------------+---------+-----------+----------+--------------+ FV DistalFull                                                        +---------+---------------+---------+-----------+----------+--------------+ PFV      Full                                                        +---------+---------------+---------+-----------+----------+--------------+  POP      Full           Yes      Yes                                 +---------+---------------+---------+-----------+----------+--------------+ PTV      Full                                                        +---------+---------------+---------+-----------+----------+--------------+ PERO     Full                                                        +---------+---------------+---------+-----------+----------+--------------+     Summary: Right: There is no evidence of deep vein thrombosis in the lower extremity. No cystic structure found in the popliteal fossa. Left: There is no evidence of deep vein thrombosis in the lower extremity. No cystic structure found in the popliteal fossa.  *See table(s) above for measurements and observations. Electronically signed by Curt Jews MD on 05/08/2019 at 12:54:07 PM.    Final     Procedures Procedures (including  critical care time)  Medications Ordered in ED Medications  lisinopril (ZESTRIL) tablet 5 mg (5 mg Oral Given 05/08/19 1028)     Initial Impression / Assessment and Plan / ED Course  I have reviewed the triage vital signs and the nursing notes.  Pertinent labs & imaging results that were available during my care of the patient were reviewed by me and considered in my medical decision making (see chart for details).        Patient presenting with several month history of dyspnea on exertion and bilateral claudication in the calf.  Patient has good pulses and cap refill.  Bilateral DVT study is negative.  Chest x-ray is clear.  EKG shows NSR with previous infarct.  2 troponins are also negative.  BNP is within normal limits.  CBC, BMP unremarkable except for mild decrease in bicarb, however anion gap is 11.  Patient is very well-appearing and low suspicion for DKA at this time.  However, he has been off of his insulin for the past few weeks.  Will refill this as well as his antihypertensives and atorvastatin.  Patient encouraged to follow-up with his PCP as soon as possible for further evaluation of these ongoing symptoms and further medication refills.  Patient understands and agrees with plan.  Patient vitals stable throughout ED course and discharged in satisfactory condition.  Final Clinical Impressions(s) / ED Diagnoses   Final diagnoses:  Leg cramps  Shortness of breath on exertion  Encounter for medication refill    ED Discharge Orders         Ordered    metoprolol tartrate (LOPRESSOR) 25 MG tablet    Note to Pharmacy: Please consider 90 day supplies to promote better adherence   05/08/19 1401    lisinopril (ZESTRIL) 5 MG tablet  Daily     05/08/19 1401    atorvastatin (LIPITOR) 80 MG tablet  Daily     05/08/19 1401    Insulin Isophane & Regular Human (  NOVOLIN 70/30 FLEXPEN) (70-30) 100 UNIT/ML PEN  2 times daily    Note to Pharmacy: NOT novolog   05/08/19 [address], [place], PA-C 05/08/19 1832    Davonna Belling, MD 05/09/19 [phone]

## 2019-06-25 ENCOUNTER — Ambulatory Visit: Payer: BLUE CROSS/BLUE SHIELD | Admitting: Cardiovascular Disease

## 2019-07-03 DIAGNOSIS — Z955 Presence of coronary angioplasty implant and graft: Secondary | ICD-10-CM | POA: Insufficient documentation

## 2020-02-04 ENCOUNTER — Encounter (HOSPITAL_COMMUNITY): Payer: Self-pay | Admitting: Emergency Medicine

## 2020-02-04 ENCOUNTER — Emergency Department (HOSPITAL_COMMUNITY)
Admission: EM | Admit: 2020-02-04 | Discharge: 2020-02-04 | Disposition: A | Discharge status: Home / Self Care | Payer: BLUE CROSS/BLUE SHIELD | Attending: Emergency Medicine | Admitting: Emergency Medicine

## 2020-02-04 ENCOUNTER — Other Ambulatory Visit: Payer: Self-pay

## 2020-02-04 DIAGNOSIS — I119 Hypertensive heart disease without heart failure: Secondary | ICD-10-CM | POA: Diagnosis not present

## 2020-02-04 DIAGNOSIS — E119 Type 2 diabetes mellitus without complications: Secondary | ICD-10-CM | POA: Diagnosis not present

## 2020-02-04 DIAGNOSIS — Z9861 Coronary angioplasty status: Secondary | ICD-10-CM | POA: Insufficient documentation

## 2020-02-04 DIAGNOSIS — Z7902 Long term (current) use of antithrombotics/antiplatelets: Secondary | ICD-10-CM | POA: Diagnosis not present

## 2020-02-04 DIAGNOSIS — Z7984 Long term (current) use of oral hypoglycemic drugs: Secondary | ICD-10-CM | POA: Diagnosis not present

## 2020-02-04 DIAGNOSIS — L989 Disorder of the skin and subcutaneous tissue, unspecified: Secondary | ICD-10-CM | POA: Diagnosis not present

## 2020-02-04 DIAGNOSIS — I2510 Atherosclerotic heart disease of native coronary artery without angina pectoris: Secondary | ICD-10-CM | POA: Insufficient documentation

## 2020-02-04 DIAGNOSIS — Z5189 Encounter for other specified aftercare: Secondary | ICD-10-CM

## 2020-02-04 NOTE — ED Provider Notes (Signed)
[facility] DEPT Provider Note   CSN: [id number] Arrival date & time: 02/04/20  1902     History Chief Complaint  Patient presents with  . Wound Check    Tommy Jimenez is a 54 y.o. male.  HPI   54 year old male with a history of CAD, cardiomyopathy, diabetes, DVT, hypertension, MI, PVD, hyperlipidemia, who presents to the emergency department today for evaluation of a wound to the chin.  States that 3 months ago he cut his chin while shaving and started bleeding.  Since then he has had intermittent bleeding from the area and has had some redness to the skin is more raised than it was prior to the injury.  Today he states that his chin started bleeding after picking the scab and did not stop bleeding for several hours.  By the time of my evaluation the wound is no longer bleeding.  He denies any fevers or other symptoms.  States he is on Brilinta.  Past Medical History:  Diagnosis Date  . Coronary artery disease    stent placed  . Dilated cardiomyopathy ([place])   . DM (diabetes mellitus) ([place]) 1996  . DVT (deep venous thrombosis) ([facility])    right leg   . Erectile dysfunction   . H/O acute myocardial infarction   . H/O heart artery stent   . History of DVT (deep vein thrombosis)   . HTN (hypertension)   . Ischemic cardiomyopathy   . Myocardial infarction ([place])   . Obesity   . Peripheral vascular disease ([place])   . Pure hypercholesterolemia     Patient Active Problem List   Diagnosis Date Noted  . Heart disease 12/12/2017  . Noncompliance 12/12/2017  . Sepsis ([place]) 11/22/2017  . Perirectal abscess 11/22/2017  . Cellulitis and abscess of buttock 11/20/2017  . S/P coronary artery stent placement   . Ex-smoker 06/21/2017  . Ischemic dilated cardiomyopathy ([place]) 06/21/2017  . Long term (current) use of aspirin 06/21/2017  . Tobacco abuse 05/31/2017  . ST elevation myocardial infarction (STEMI), subsequent episode of care ([place]) 05/30/2017  .  Diabetes mellitus ([place]) 11/29/2013  . Hyperlipidemia 11/29/2013  . Essential hypertension, benign 11/29/2013  . Erectile dysfunction 11/29/2013  . Type 2 diabetes mellitus, with long-term current use of insulin ([place]) 11/29/2013    Past Surgical History:  Procedure Laterality Date  . CORONARY STENT PLACEMENT    . INCISION AND DRAINAGE PERIRECTAL ABSCESS Left 11/20/2017   Procedure: IRRIGATION AND DEBRIDEMENT PERIRECTAL ABSCESS;  Surgeon: Stark Klein, MD;  Location: [facility];  Service: General;  Laterality: Left;       Family History  Problem Relation Age of Onset  . Diabetes Mother        complications of diabetes  . Heart disease Father        died of MI, died age 45yo  . Hypertension Father   . Multiple sclerosis Sister   . Cancer Brother        brain tumor  . Diabetes Brother   . Lupus Sister   . Lupus Sister   . Stroke Neg Hx     Social History   Tobacco Use  . Smoking status: Former Smoker    Years: 20.00    Types: Cigarettes  . Smokeless tobacco: Never Used  Substance Use Topics  . Alcohol use: Yes    Comment: socially - beer  . Drug use: No    Home Medications Prior to Admission medications   Medication Sig Start Date End  Date Taking? Authorizing Provider  atorvastatin (LIPITOR) 80 MG tablet Take 1 tablet (80 mg total) by mouth daily. 05/08/19   Law, Bea Graff, PA-C  glipiZIDE (GLUCOTROL XL) 10 MG 24 hr tablet Take 10 mg by mouth daily.    [provider]  Insulin Isophane & Regular Human (NOVOLIN 70/30 FLEXPEN) (70-30) 100 UNIT/ML PEN Inject 35 Units into the skin 2 (two) times daily. 05/08/19   Law, Bea Graff, PA-C  lisinopril (ZESTRIL) 5 MG tablet Take 1 tablet (5 mg total) by mouth daily. 05/08/19   Law, Bea Graff, PA-C  metoprolol tartrate (LOPRESSOR) 25 MG tablet TAKE 1 & 1/2 (ONE & ONE-HALF) TABLETS BY MOUTH TWICE DAILY 05/08/19   Law, Bea Graff, PA-C  tadalafil (CIALIS) 10 MG tablet 1/2-1 tablet prn Patient not taking: Reported on  10/10/2018 12/28/17   Tysinger, Camelia Eng, PA-C    Allergies    Patient has no known allergies.  Review of Systems   Review of Systems  Constitutional: Negative for fever.  Skin: Positive for wound.    Physical Exam Updated Vital Signs BP (!) 163/95 (BP Location: Left Arm)   Pulse 92   Temp 97.9 F (36.6 C) (Oral)   Resp 16   Ht 5\' 8"  (1.727 m)   Wt 72.6 kg   SpO2 97%   BMI 24.33 kg/m   Physical Exam Constitutional:      General: He is not in acute distress.    Appearance: He is well-developed.  Eyes:     Conjunctiva/sclera: Conjunctivae normal.  Cardiovascular:     Rate and Rhythm: Normal rate.  Pulmonary:     Effort: Pulmonary effort is normal.  Skin:    General: Skin is warm and dry.     Comments: 0.5cm raised erythematous papule noted to the chin. No evidence of bleeding at this time.   Neurological:     Mental Status: He is alert and oriented to person, place, and time.       ED Results / Procedures / Treatments   Labs (all labs ordered are listed, but only abnormal results are displayed) Labs Reviewed - No data to display  EKG None  Radiology No results found.  Procedures Procedures (including critical care time)  Medications Ordered in ED Medications - No data to display  ED Course  I have reviewed the triage vital signs and the nursing notes.  Pertinent labs & imaging results that were available during my care of the patient were reviewed by me and considered in my medical decision making (see chart for details).    MDM Rules/Calculators/A&P                      54 year old male with a history of CAD, cardiomyopathy, diabetes, DVT, hypertension, MI, PVD, hyperlipidemia, who presents to the emergency department today for evaluation of a wound to the chin.  States that 3 months ago he cut his chin while shaving and started bleeding.  Since then he has had intermittent bleeding from the area and has had some redness to the skin is more raised  than it was prior to the injury.  Today he states that his chin started bleeding after picking the scab and did not stop bleeding for several hours.  By the time of my evaluation the wound is no longer bleeding.  He denies any fevers or other symptoms.  States he is on Brilinta.  Hemostasis was achieved by the time of my evaluation.  He  does not have any signs of infection on exam.  Unclear etiology of his skin abnormality but recommended he follow-up with his PCP or dermatology for a potential biopsy.  Have advised him to return to the ED for new or worsening symptoms.  He voiced understanding plan reasons to return.  All questions answered.  Patient stable for discharge.  Final Clinical Impression(s) / ED Diagnoses Final diagnoses:  Visit for wound check    Rx / DC Orders ED Discharge Orders    None       Bishop Dublin 02/04/20 2006    Drenda Freeze, MD 02/04/20 2328

## 2020-02-04 NOTE — Discharge Instructions (Signed)
You may use bacitracin twice daily on the wound for 1 week.   Please follow up with your primary care provider within 5-7 days for re-evaluation of your symptoms. If you do not have a primary care provider, information for a healthcare clinic has been provided for you to make arrangements for follow up care. Please return to the emergency department for any new or worsening symptoms.

## 2020-02-04 NOTE — ED Triage Notes (Addendum)
Patient states he nicked himself shaving x2 months ago. Patient is on blood thinners and say he is having intermittent bleeding from the site. Patient states he cannot get the wound to heal and today it has been bleeding for about 2 hours. Patient noted to be hypertensive, but states he has not taken his BP meds today.

## 2020-03-12 DIAGNOSIS — D489 Neoplasm of uncertain behavior, unspecified: Secondary | ICD-10-CM | POA: Insufficient documentation

## 2020-03-29 ENCOUNTER — Emergency Department (HOSPITAL_COMMUNITY)
Admission: EM | Admit: 2020-03-29 | Discharge: 2020-03-29 | Disposition: A | Discharge status: Home / Self Care | Payer: BLUE CROSS/BLUE SHIELD | Attending: Emergency Medicine | Admitting: Emergency Medicine

## 2020-03-29 ENCOUNTER — Other Ambulatory Visit: Payer: Self-pay

## 2020-03-29 DIAGNOSIS — Z79899 Other long term (current) drug therapy: Secondary | ICD-10-CM | POA: Diagnosis not present

## 2020-03-29 DIAGNOSIS — Z794 Long term (current) use of insulin: Secondary | ICD-10-CM | POA: Diagnosis not present

## 2020-03-29 DIAGNOSIS — Z7982 Long term (current) use of aspirin: Secondary | ICD-10-CM | POA: Diagnosis not present

## 2020-03-29 DIAGNOSIS — T148XXA Other injury of unspecified body region, initial encounter: Secondary | ICD-10-CM

## 2020-03-29 DIAGNOSIS — I10 Essential (primary) hypertension: Secondary | ICD-10-CM | POA: Insufficient documentation

## 2020-03-29 DIAGNOSIS — Z87891 Personal history of nicotine dependence: Secondary | ICD-10-CM | POA: Diagnosis not present

## 2020-03-29 DIAGNOSIS — E119 Type 2 diabetes mellitus without complications: Secondary | ICD-10-CM | POA: Insufficient documentation

## 2020-03-29 DIAGNOSIS — I2510 Atherosclerotic heart disease of native coronary artery without angina pectoris: Secondary | ICD-10-CM | POA: Insufficient documentation

## 2020-03-29 DIAGNOSIS — M79604 Pain in right leg: Secondary | ICD-10-CM | POA: Diagnosis present

## 2020-03-29 LAB — CBC
HCT: 41 % (ref 39.0–52.0)
Hemoglobin: 13 g/dL (ref 13.0–17.0)
MCH: 27.8 pg (ref 26.0–34.0)
MCHC: 31.7 g/dL (ref 30.0–36.0)
MCV: 87.6 fL (ref 80.0–100.0)
Platelets: 366 10*3/uL (ref 150–400)
RBC: 4.68 MIL/uL (ref 4.22–5.81)
RDW: 13.2 % (ref 11.5–15.5)
WBC: 8.4 10*3/uL (ref 4.0–10.5)
nRBC: 0 % (ref 0.0–0.2)

## 2020-03-29 LAB — COMPREHENSIVE METABOLIC PANEL
ALT: 12 U/L (ref 0–44)
AST: 14 U/L — ABNORMAL LOW (ref 15–41)
Albumin: 4 g/dL (ref 3.5–5.0)
Alkaline Phosphatase: 82 U/L (ref 38–126)
Anion gap: 10 (ref 5–15)
BUN: 10 mg/dL (ref 6–20)
CO2: 23 mmol/L (ref 22–32)
Calcium: 8.8 mg/dL — ABNORMAL LOW (ref 8.9–10.3)
Chloride: 105 mmol/L (ref 98–111)
Creatinine, Ser: 0.87 mg/dL (ref 0.61–1.24)
GFR calc Af Amer: >60 mL/min (ref >60)
GFR calc non Af Amer: >60 mL/min (ref >60)
Glucose, Bld: 355 mg/dL — ABNORMAL HIGH (ref 70–99)
Potassium: 3.9 mmol/L (ref 3.5–5.1)
Sodium: 138 mmol/L (ref 135–145)
Total Bilirubin: 0.4 mg/dL (ref 0.3–1.2)
Total Protein: 7.3 g/dL (ref 6.5–8.1)

## 2020-03-29 LAB — PROTIME-INR
INR: 0.9 (ref 0.8–1.2)
Prothrombin Time: 12.1 seconds (ref 11.4–15.2)

## 2020-03-29 LAB — D-DIMER, QUANTITATIVE: D-Dimer, Quant: 0.27 ug/mL-FEU (ref 0.00–0.50)

## 2020-03-29 LAB — APTT: aPTT: 25 seconds (ref 24–36)

## 2020-03-29 MED ORDER — METHOCARBAMOL 500 MG PO TABS: 500.0000 mg | ORAL_TABLET | Freq: Two times a day (BID) | ORAL | 0 refills | Status: DC

## 2020-03-29 NOTE — ED Triage Notes (Addendum)
Patient reports he has sternal pain and it feels like he has gas. Patient also states he has calf pain in both legs and last time this happened he had blood clots and they went to his chest. Pain rated 8/10 . Patient endorses taking anticoagulants

## 2020-03-29 NOTE — ED Provider Notes (Signed)
[facility] DEPT Provider Note   CSN: [id number] Arrival date & time: 03/29/20  1550     History Chief Complaint  Patient presents with   Leg Pain    Tommy Jimenez is a 54 y.o. male.  54 year old male presents with recurrent pain to his right posterior thigh as well as his right calf.  Has had this for some time now.  Does have a history of DVT.  States that his symptoms have been recurrent and are worse with standing.  Does favor his right leg when he stands.  He denies any pleuritic chest pain.  He has not been short of breath.  He has had no fever or chills.  Pain is better with rest.  No treatment use prior to arrival.        Past Medical History:  Diagnosis Date   Coronary artery disease    stent placed   Dilated cardiomyopathy ([place])    DM (diabetes mellitus) ([place]) 1996   DVT (deep venous thrombosis) (H[facility])    right leg    Erectile dysfunction    H/O acute myocardial infarction    H/O heart artery stent    History of DVT (deep vein thrombosis)    HTN (hypertension)    Ischemic cardiomyopathy    Myocardial infarction ([place])    Obesity    Peripheral vascular disease ([place])    Pure hypercholesterolemia     Patient Active Problem List   Diagnosis Date Noted   Heart disease 12/12/2017   Noncompliance 12/12/2017   Sepsis ([place]) 11/22/2017   Perirectal abscess 11/22/2017   Cellulitis and abscess of buttock 11/20/2017   S/P coronary artery stent placement    Ex-smoker 06/21/2017   Ischemic dilated cardiomyopathy ([place]) 06/21/2017   Long term (current) use of aspirin 06/21/2017   Tobacco abuse 05/31/2017   ST elevation myocardial infarction (STEMI), subsequent episode of care ([place]) 05/30/2017   Diabetes mellitus ([place]) 11/29/2013   Hyperlipidemia 11/29/2013   Essential hypertension, benign 11/29/2013   Erectile dysfunction 11/29/2013   Type 2 diabetes mellitus, with long-term current use of insulin ([place])  11/29/2013    Past Surgical History:  Procedure Laterality Date   CORONARY STENT PLACEMENT     INCISION AND DRAINAGE PERIRECTAL ABSCESS Left 11/20/2017   Procedure: IRRIGATION AND DEBRIDEMENT PERIRECTAL ABSCESS;  Surgeon: Stark Klein, MD;  Location: [facility];  Service: General;  Laterality: Left;       Family History  Problem Relation Age of Onset   Diabetes Mother        complications of diabetes   Heart disease Father        died of MI, died age 47yo   Hypertension Father    Multiple sclerosis Sister    Cancer Brother        brain tumor   Diabetes Brother    Lupus Sister    Lupus Sister    Stroke Neg Hx     Social History   Tobacco Use   Smoking status: Former Smoker    Years: 20.00    Types: Cigarettes   Smokeless tobacco: Never Used  Scientific laboratory technician Use: Never used  Substance Use Topics   Alcohol use: Yes    Comment: socially - beer   Drug use: No    Home Medications Prior to Admission medications   Medication Sig Start Date End Date Taking? Authorizing Provider  atorvastatin (LIPITOR) 80 MG tablet Take 1 tablet (80 mg total) by mouth daily.  05/08/19   Law, Bea Graff, PA-C  glipiZIDE (GLUCOTROL XL) 10 MG 24 hr tablet Take 10 mg by mouth daily.    [provider]  Insulin Isophane & Regular Human (NOVOLIN 70/30 FLEXPEN) (70-30) 100 UNIT/ML PEN Inject 35 Units into the skin 2 (two) times daily. 05/08/19   Law, Bea Graff, PA-C  lisinopril (ZESTRIL) 5 MG tablet Take 1 tablet (5 mg total) by mouth daily. 05/08/19   Law, Bea Graff, PA-C  metoprolol tartrate (LOPRESSOR) 25 MG tablet TAKE 1 & 1/2 (ONE & ONE-HALF) TABLETS BY MOUTH TWICE DAILY 05/08/19   Law, Bea Graff, PA-C  tadalafil (CIALIS) 10 MG tablet 1/2-1 tablet prn Patient not taking: Reported on 10/10/2018 12/28/17   Tysinger, Camelia Eng, PA-C    Allergies    Patient has no known allergies.  Review of Systems   Review of Systems  All other systems reviewed and are  negative.   Physical Exam Updated Vital Signs BP (!) 142/110    Pulse 84    Temp 98.2 F (36.8 C) (Oral)    Resp 18    Ht 1.753 m (5\' 9" )    Wt 74.4 kg    SpO2 100%    BMI 24.22 kg/m   Physical Exam Vitals and nursing note reviewed.  Constitutional:      General: He is not in acute distress.    Appearance: Normal appearance. He is well-developed. He is not toxic-appearing.  HENT:     Head: Normocephalic and atraumatic.  Eyes:     General: Lids are normal.     Conjunctiva/sclera: Conjunctivae normal.     Pupils: Pupils are equal, round, and reactive to light.  Neck:     Thyroid: No thyroid mass.     Trachea: No tracheal deviation.  Cardiovascular:     Rate and Rhythm: Normal rate and regular rhythm.     Heart sounds: Normal heart sounds. No murmur heard.  No gallop.   Pulmonary:     Effort: Pulmonary effort is normal. No respiratory distress.     Breath sounds: Normal breath sounds. No stridor. No decreased breath sounds, wheezing, rhonchi or rales.  Abdominal:     General: Bowel sounds are normal. There is no distension.     Palpations: Abdomen is soft.     Tenderness: There is no abdominal tenderness. There is no rebound.  Musculoskeletal:        General: No tenderness. Normal range of motion.     Cervical back: Normal range of motion and neck supple.       Legs:     Comments: Neurovascular tact at right foot  Skin:    General: Skin is warm and dry.     Findings: No abrasion or rash.  Neurological:     Mental Status: He is alert and oriented to person, place, and time.     GCS: GCS eye subscore is 4. GCS verbal subscore is 5. GCS motor subscore is 6.     Cranial Nerves: No cranial nerve deficit.     Sensory: No sensory deficit.  Psychiatric:        Speech: Speech normal.        Behavior: Behavior normal.     ED Results / Procedures / Treatments   Labs (all labs ordered are listed, but only abnormal results are displayed) Labs Reviewed  COMPREHENSIVE  METABOLIC PANEL - Abnormal; Notable for the following components:      Result Value   Glucose, Bld 355 (*)  Calcium 8.8 (*)    AST 14 (*)    All other components within normal limits  CBC  APTT  PROTIME-INR  D-DIMER, QUANTITATIVE (NOT AT [facility])    EKG None  Radiology No results found.  Procedures Procedures (including critical care time)  Medications Ordered in ED Medications - No data to display  ED Course  I have reviewed the triage vital signs and the nursing notes.  Pertinent labs & imaging results that were available during my care of the patient were reviewed by me and considered in my medical decision making (see chart for details).    MDM Rules/Calculators/A&P                          Patient's labs reviewed here and are significant for mild hyperglycemia.  He states that he has not taken his insulin dose today.  His pain appears to be musculoskeletal in nature.  His D-dimer was negative here.  Extremely low suspicion for DVT.  No concern for PE.  Will be discharged home with muscle relaxants.  Patient is comfortable with this. Final Clinical Impression(s) / ED Diagnoses Final diagnoses:  None    Rx / DC Orders ED Discharge Orders    None       Lacretia Leigh, MD 03/29/20 2209

## 2020-05-21 ENCOUNTER — Emergency Department (HOSPITAL_COMMUNITY)
Admission: EM | Admit: 2020-05-21 | Discharge: 2020-05-21 | Disposition: A | Discharge status: Home / Self Care | Payer: BLUE CROSS/BLUE SHIELD | Attending: Emergency Medicine | Admitting: Emergency Medicine

## 2020-05-21 ENCOUNTER — Encounter (HOSPITAL_COMMUNITY): Payer: Self-pay

## 2020-05-21 ENCOUNTER — Other Ambulatory Visit: Payer: Self-pay

## 2020-05-21 DIAGNOSIS — Z79899 Other long term (current) drug therapy: Secondary | ICD-10-CM | POA: Diagnosis not present

## 2020-05-21 DIAGNOSIS — I10 Essential (primary) hypertension: Secondary | ICD-10-CM | POA: Insufficient documentation

## 2020-05-21 DIAGNOSIS — Z87891 Personal history of nicotine dependence: Secondary | ICD-10-CM | POA: Diagnosis not present

## 2020-05-21 DIAGNOSIS — I2510 Atherosclerotic heart disease of native coronary artery without angina pectoris: Secondary | ICD-10-CM | POA: Insufficient documentation

## 2020-05-21 DIAGNOSIS — H6692 Otitis media, unspecified, left ear: Secondary | ICD-10-CM | POA: Diagnosis not present

## 2020-05-21 DIAGNOSIS — Z794 Long term (current) use of insulin: Secondary | ICD-10-CM | POA: Insufficient documentation

## 2020-05-21 DIAGNOSIS — Z20822 Contact with and (suspected) exposure to covid-19: Secondary | ICD-10-CM | POA: Diagnosis not present

## 2020-05-21 DIAGNOSIS — E119 Type 2 diabetes mellitus without complications: Secondary | ICD-10-CM | POA: Insufficient documentation

## 2020-05-21 DIAGNOSIS — J029 Acute pharyngitis, unspecified: Secondary | ICD-10-CM | POA: Insufficient documentation

## 2020-05-21 DIAGNOSIS — H6690 Otitis media, unspecified, unspecified ear: Secondary | ICD-10-CM

## 2020-05-21 DIAGNOSIS — H9202 Otalgia, left ear: Secondary | ICD-10-CM | POA: Diagnosis present

## 2020-05-21 LAB — SARS CORONAVIRUS 2 BY RT PCR (HOSPITAL ORDER, PERFORMED IN ~~LOC~~ HOSPITAL LAB): SARS Coronavirus 2: NEGATIVE

## 2020-05-21 MED ORDER — AMOXICILLIN 500 MG PO CAPS: 1000.0000 mg | ORAL_CAPSULE | Freq: Two times a day (BID) | ORAL | 0 refills | Status: DC

## 2020-05-21 MED ORDER — OXYMETAZOLINE HCL 0.05 % NA SOLN
1.0000 | Freq: Once | NASAL | Status: AC
  Administered 2020-05-21: 1 via NASAL
  Filled 2020-05-21: qty 30

## 2020-05-21 NOTE — Discharge Instructions (Addendum)
Take antibiotics as prescribed. Use nasal spray as per package instructions up to 3 days Due to Covid prevalence currently, you are being tested for Covid. Please quarantine until results are available which should be in several hours.  If these are negative, you can go to work and interact as usual.  If these are positive, you will need to quarantine and isolate until 10 days after your symptoms began and 24 hours after any fever.

## 2020-05-21 NOTE — ED Provider Notes (Signed)
[facility] DEPT Provider Note   CSN: [id number] Arrival date & time: 05/21/20  1503     History Chief Complaint  Patient presents with  . Otalgia    Tommy Jimenez is a 54 y.o. male.  HPI 54 year old male history of diabetes, coronary artery disease, vaccinated for COVID in March presents today with some nasal drainage and left ear pressure.  This has been going on for 1 to 2 days.  He has not noted fever, chills, cough, dyspnea, nausea, vomiting, diarrhea.  He has had no known Covid exposures but does work.  He states he has been wearing his mask at work.    Past Medical History:  Diagnosis Date  . Coronary artery disease    stent placed  . Dilated cardiomyopathy ([place])   . DM (diabetes mellitus) ([place]) 1996  . DVT (deep venous thrombosis) (H[facility])    right leg   . Erectile dysfunction   . H/O acute myocardial infarction   . H/O heart artery stent   . History of DVT (deep vein thrombosis)   . HTN (hypertension)   . Ischemic cardiomyopathy   . Myocardial infarction ([place])   . Obesity   . Peripheral vascular disease ([place])   . Pure hypercholesterolemia     Patient Active Problem List   Diagnosis Date Noted  . Heart disease 12/12/2017  . Noncompliance 12/12/2017  . Sepsis ([place]) 11/22/2017  . Perirectal abscess 11/22/2017  . Cellulitis and abscess of buttock 11/20/2017  . S/P coronary artery stent placement   . Ex-smoker 06/21/2017  . Ischemic dilated cardiomyopathy ([place]) 06/21/2017  . Long term (current) use of aspirin 06/21/2017  . Tobacco abuse 05/31/2017  . ST elevation myocardial infarction (STEMI), subsequent episode of care ([place]) 05/30/2017  . Diabetes mellitus ([place]) 11/29/2013  . Hyperlipidemia 11/29/2013  . Essential hypertension, benign 11/29/2013  . Erectile dysfunction 11/29/2013  . Type 2 diabetes mellitus, with long-term current use of insulin ([place]) 11/29/2013    Past Surgical History:  Procedure Laterality Date  .  CORONARY STENT PLACEMENT    . INCISION AND DRAINAGE PERIRECTAL ABSCESS Left 11/20/2017   Procedure: IRRIGATION AND DEBRIDEMENT PERIRECTAL ABSCESS;  Surgeon: Stark Klein, MD;  Location: [facility];  Service: General;  Laterality: Left;       Family History  Problem Relation Age of Onset  . Diabetes Mother        complications of diabetes  . Heart disease Father        died of MI, died age 12yo  . Hypertension Father   . Multiple sclerosis Sister   . Cancer Brother        brain tumor  . Diabetes Brother   . Lupus Sister   . Lupus Sister   . Stroke Neg Hx     Social History   Tobacco Use  . Smoking status: Former Smoker    Years: 20.00    Types: Cigarettes  . Smokeless tobacco: Never Used  Vaping Use  . Vaping Use: Never used  Substance Use Topics  . Alcohol use: Yes    Comment: socially - beer  . Drug use: No    Home Medications Prior to Admission medications   Medication Sig Start Date End Date Taking? Authorizing Provider  atorvastatin (LIPITOR) 80 MG tablet Take 1 tablet (80 mg total) by mouth daily. 05/08/19   Law, Bea Graff, PA-C  glipiZIDE (GLUCOTROL XL) 10 MG 24 hr tablet Take 10 mg by mouth daily.    [provider]  Insulin Isophane & Regular Human (NOVOLIN 70/30 FLEXPEN) (70-30) 100 UNIT/ML PEN Inject 35 Units into the skin 2 (two) times daily. 05/08/19   Law, Bea Graff, PA-C  lisinopril (ZESTRIL) 5 MG tablet Take 1 tablet (5 mg total) by mouth daily. 05/08/19   Law, Bea Graff, PA-C  methocarbamol (ROBAXIN) 500 MG tablet Take 1 tablet (500 mg total) by mouth 2 (two) times daily. 03/29/20   Lacretia Leigh, MD  metoprolol tartrate (LOPRESSOR) 25 MG tablet TAKE 1 & 1/2 (ONE & ONE-HALF) TABLETS BY MOUTH TWICE DAILY 05/08/19   Law, Bea Graff, PA-C  tadalafil (CIALIS) 10 MG tablet 1/2-1 tablet prn Patient not taking: Reported on 10/10/2018 12/28/17   Tysinger, Camelia Eng, PA-C    Allergies    Patient has no known allergies.  Review of Systems   Review of  Systems  All other systems reviewed and are negative.   Physical Exam Updated Vital Signs BP (!) 177/99 (BP Location: Right Arm)   Pulse (!) 102   Temp 98.1 F (36.7 C) (Oral)   Resp 17   SpO2 100%   Physical Exam Vitals and nursing note reviewed.  Constitutional:      Appearance: Normal appearance.  HENT:     Head: Normocephalic.     Right Ear: Tympanic membrane and external ear normal.     Left Ear: External ear normal.     Ears:     Comments: Left TM erythematous with poor landmarks    Nose: Nose normal.     Mouth/Throat:     Mouth: Mucous membranes are moist.  Eyes:     Pupils: Pupils are equal, round, and reactive to light.  Cardiovascular:     Rate and Rhythm: Normal rate.  Pulmonary:     Effort: Pulmonary effort is normal.     Breath sounds: Normal breath sounds.  Abdominal:     General: Abdomen is flat.  Musculoskeletal:        General: Normal range of motion.     Cervical back: Normal range of motion.  Skin:    General: Skin is warm.     Capillary Refill: Capillary refill takes less than 2 seconds.  Neurological:     General: No focal deficit present.     Mental Status: He is alert and oriented to person, place, and time.  Psychiatric:        Mood and Affect: Mood normal.        Behavior: Behavior normal.     ED Results / Procedures / Treatments   Labs (all labs ordered are listed, but only abnormal results are displayed) Labs Reviewed - No data to display  EKG None  Radiology No results found.  Procedures Procedures (including critical care time)  Medications Ordered in ED Medications - No data to display  ED Course  I have reviewed the triage vital signs and the nursing notes.  Pertinent labs & imaging results that were available during my care of the patient were reviewed by me and considered in my medical decision making (see chart for details).    MDM Rules/Calculators/A&P                          Presents today with left ear  otitis and some sore throat and nasal congestion.  Patient being treated for otitis media.  He has been vaccinated with Covid.  However, given the prevalence of the delta virus virus currently, patient  is being tested and given instructions about quarantining and isolation if this is positive. Final Clinical Impression(s) / ED Diagnoses Final diagnoses:  Acute otitis media, unspecified otitis media type    Rx / DC Orders ED Discharge Orders    None       Pattricia Boss, MD 05/21/20 [phone]

## 2020-05-21 NOTE — ED Triage Notes (Signed)
Arrived POV from home. Patient reports left ear pain that started today, precipitated by itchy/sore throat. Patient states he has not taken anything for ear pain. Denies any drainage from ear

## 2020-06-23 DIAGNOSIS — E7800 Pure hypercholesterolemia, unspecified: Secondary | ICD-10-CM | POA: Insufficient documentation

## 2021-12-03 ENCOUNTER — Observation Stay (HOSPITAL_COMMUNITY): Payer: Self-pay

## 2021-12-03 ENCOUNTER — Inpatient Hospital Stay (HOSPITAL_COMMUNITY)
Admission: EM | Admit: 2021-12-03 | Discharge: 2021-12-06 | DRG: 065 | Disposition: A | Discharge status: Home / Self Care | Payer: Self-pay | Attending: Internal Medicine | Admitting: Internal Medicine

## 2021-12-03 ENCOUNTER — Encounter (HOSPITAL_COMMUNITY): Payer: Self-pay

## 2021-12-03 ENCOUNTER — Other Ambulatory Visit: Payer: Self-pay

## 2021-12-03 ENCOUNTER — Emergency Department (HOSPITAL_COMMUNITY): Payer: Self-pay

## 2021-12-03 DIAGNOSIS — Z720 Tobacco use: Secondary | ICD-10-CM | POA: Diagnosis present

## 2021-12-03 DIAGNOSIS — Z597 Insufficient social insurance and welfare support: Secondary | ICD-10-CM

## 2021-12-03 DIAGNOSIS — E119 Type 2 diabetes mellitus without complications: Secondary | ICD-10-CM

## 2021-12-03 DIAGNOSIS — I110 Hypertensive heart disease with heart failure: Secondary | ICD-10-CM | POA: Diagnosis present

## 2021-12-03 DIAGNOSIS — R29700 NIHSS score 0: Secondary | ICD-10-CM | POA: Diagnosis present

## 2021-12-03 DIAGNOSIS — E7800 Pure hypercholesterolemia, unspecified: Secondary | ICD-10-CM | POA: Diagnosis present

## 2021-12-03 DIAGNOSIS — I6340 Cerebral infarction due to embolism of unspecified cerebral artery: Principal | ICD-10-CM | POA: Diagnosis present

## 2021-12-03 DIAGNOSIS — I2510 Atherosclerotic heart disease of native coronary artery without angina pectoris: Secondary | ICD-10-CM

## 2021-12-03 DIAGNOSIS — Z794 Long term (current) use of insulin: Secondary | ICD-10-CM

## 2021-12-03 DIAGNOSIS — G8191 Hemiplegia, unspecified affecting right dominant side: Secondary | ICD-10-CM | POA: Diagnosis present

## 2021-12-03 DIAGNOSIS — Z7984 Long term (current) use of oral hypoglycemic drugs: Secondary | ICD-10-CM

## 2021-12-03 DIAGNOSIS — Z7982 Long term (current) use of aspirin: Secondary | ICD-10-CM

## 2021-12-03 DIAGNOSIS — K047 Periapical abscess without sinus: Secondary | ICD-10-CM | POA: Diagnosis present

## 2021-12-03 DIAGNOSIS — Z86718 Personal history of other venous thrombosis and embolism: Secondary | ICD-10-CM

## 2021-12-03 DIAGNOSIS — E1165 Type 2 diabetes mellitus with hyperglycemia: Secondary | ICD-10-CM | POA: Diagnosis present

## 2021-12-03 DIAGNOSIS — I10 Essential (primary) hypertension: Secondary | ICD-10-CM | POA: Diagnosis present

## 2021-12-03 DIAGNOSIS — Z820 Family history of epilepsy and other diseases of the nervous system: Secondary | ICD-10-CM

## 2021-12-03 DIAGNOSIS — Z8249 Family history of ischemic heart disease and other diseases of the circulatory system: Secondary | ICD-10-CM

## 2021-12-03 DIAGNOSIS — N529 Male erectile dysfunction, unspecified: Secondary | ICD-10-CM | POA: Diagnosis present

## 2021-12-03 DIAGNOSIS — Z91120 Patient's intentional underdosing of medication regimen due to financial hardship: Secondary | ICD-10-CM

## 2021-12-03 DIAGNOSIS — D649 Anemia, unspecified: Secondary | ICD-10-CM

## 2021-12-03 DIAGNOSIS — Z833 Family history of diabetes mellitus: Secondary | ICD-10-CM

## 2021-12-03 DIAGNOSIS — Z8674 Personal history of sudden cardiac arrest: Secondary | ICD-10-CM

## 2021-12-03 DIAGNOSIS — I255 Ischemic cardiomyopathy: Secondary | ICD-10-CM | POA: Diagnosis present

## 2021-12-03 DIAGNOSIS — R531 Weakness: Secondary | ICD-10-CM

## 2021-12-03 DIAGNOSIS — T383X6A Underdosing of insulin and oral hypoglycemic [antidiabetic] drugs, initial encounter: Secondary | ICD-10-CM | POA: Diagnosis present

## 2021-12-03 DIAGNOSIS — E785 Hyperlipidemia, unspecified: Secondary | ICD-10-CM | POA: Diagnosis present

## 2021-12-03 DIAGNOSIS — I5022 Chronic systolic (congestive) heart failure: Secondary | ICD-10-CM | POA: Diagnosis present

## 2021-12-03 DIAGNOSIS — I639 Cerebral infarction, unspecified: Secondary | ICD-10-CM | POA: Diagnosis present

## 2021-12-03 DIAGNOSIS — Z7902 Long term (current) use of antithrombotics/antiplatelets: Secondary | ICD-10-CM

## 2021-12-03 DIAGNOSIS — Z87891 Personal history of nicotine dependence: Secondary | ICD-10-CM | POA: Diagnosis present

## 2021-12-03 DIAGNOSIS — Z955 Presence of coronary angioplasty implant and graft: Secondary | ICD-10-CM

## 2021-12-03 DIAGNOSIS — G459 Transient cerebral ischemic attack, unspecified: Principal | ICD-10-CM

## 2021-12-03 DIAGNOSIS — Z832 Family history of diseases of the blood and blood-forming organs and certain disorders involving the immune mechanism: Secondary | ICD-10-CM

## 2021-12-03 DIAGNOSIS — E871 Hypo-osmolality and hyponatremia: Secondary | ICD-10-CM

## 2021-12-03 DIAGNOSIS — Z808 Family history of malignant neoplasm of other organs or systems: Secondary | ICD-10-CM

## 2021-12-03 DIAGNOSIS — Z79899 Other long term (current) drug therapy: Secondary | ICD-10-CM

## 2021-12-03 DIAGNOSIS — I252 Old myocardial infarction: Secondary | ICD-10-CM

## 2021-12-03 DIAGNOSIS — K029 Dental caries, unspecified: Secondary | ICD-10-CM | POA: Diagnosis present

## 2021-12-03 LAB — CBC WITH DIFFERENTIAL/PLATELET
Abs Immature Granulocytes: 0.03 10*3/uL (ref 0.00–0.07)
Basophils Absolute: 0.1 10*3/uL (ref 0.0–0.1)
Basophils Relative: 1 %
Eosinophils Absolute: 0.1 10*3/uL (ref 0.0–0.5)
Eosinophils Relative: 2 %
HCT: 37.9 % — ABNORMAL LOW (ref 39.0–52.0)
Hemoglobin: 12.6 g/dL — ABNORMAL LOW (ref 13.0–17.0)
Immature Granulocytes: 0 %
Lymphocytes Relative: 34 %
Lymphs Abs: 3 10*3/uL (ref 0.7–4.0)
MCH: 28.6 pg (ref 26.0–34.0)
MCHC: 33.2 g/dL (ref 30.0–36.0)
MCV: 85.9 fL (ref 80.0–100.0)
Monocytes Absolute: 0.7 10*3/uL (ref 0.1–1.0)
Monocytes Relative: 8 %
Neutro Abs: 4.8 10*3/uL (ref 1.7–7.7)
Neutrophils Relative %: 55 %
Platelets: 322 10*3/uL (ref 150–400)
RBC: 4.41 MIL/uL (ref 4.22–5.81)
RDW: 13.9 % (ref 11.5–15.5)
WBC: 8.9 10*3/uL (ref 4.0–10.5)
nRBC: 0 % (ref 0.0–0.2)

## 2021-12-03 LAB — COMPREHENSIVE METABOLIC PANEL
ALT: 12 U/L (ref 0–44)
AST: 15 U/L (ref 15–41)
Albumin: 4 g/dL (ref 3.5–5.0)
Alkaline Phosphatase: 72 U/L (ref 38–126)
Anion gap: 7 (ref 5–15)
BUN: 18 mg/dL (ref 6–20)
CO2: 24 mmol/L (ref 22–32)
Calcium: 8.6 mg/dL — ABNORMAL LOW (ref 8.9–10.3)
Chloride: 101 mmol/L (ref 98–111)
Creatinine, Ser: 0.87 mg/dL (ref 0.61–1.24)
GFR, Estimated: >60 mL/min (ref >60)
Glucose, Bld: 300 mg/dL — ABNORMAL HIGH (ref 70–99)
Potassium: 4.4 mmol/L (ref 3.5–5.1)
Sodium: 132 mmol/L — ABNORMAL LOW (ref 135–145)
Total Bilirubin: 0.5 mg/dL (ref 0.3–1.2)
Total Protein: 7.5 g/dL (ref 6.5–8.1)

## 2021-12-03 LAB — RAPID URINE DRUG SCREEN, HOSP PERFORMED
Amphetamines: NOT DETECTED
Barbiturates: NOT DETECTED
Benzodiazepines: NOT DETECTED
Cocaine: NOT DETECTED
Opiates: NOT DETECTED
Tetrahydrocannabinol: NOT DETECTED

## 2021-12-03 LAB — MAGNESIUM: Magnesium: 2.1 mg/dL (ref 1.7–2.4)

## 2021-12-03 LAB — GLUCOSE, CAPILLARY: Glucose-Capillary: 283 mg/dL — ABNORMAL HIGH (ref 70–99)

## 2021-12-03 LAB — CBG MONITORING, ED
Glucose-Capillary: 304 mg/dL — ABNORMAL HIGH (ref 70–99)
Glucose-Capillary: 183 mg/dL — ABNORMAL HIGH (ref 70–99)

## 2021-12-03 MED ORDER — ENSURE ENLIVE PO LIQD
237.0000 mL | Freq: Two times a day (BID) | ORAL | Status: DC
  Administered 2021-12-04 – 2021-12-06 (×5): 237 mL via ORAL

## 2021-12-03 MED ORDER — STROKE: EARLY STAGES OF RECOVERY BOOK
Freq: Once | Status: AC
  Filled 2021-12-03: qty 1

## 2021-12-03 MED ORDER — INSULIN ASPART 100 UNIT/ML IJ SOLN
0.0000 [IU] | Freq: Three times a day (TID) | INTRAMUSCULAR | Status: DC
  Administered 2021-12-04: 3 [IU] via SUBCUTANEOUS
  Administered 2021-12-04 – 2021-12-05 (×4): 5 [IU] via SUBCUTANEOUS
  Administered 2021-12-05: 3 [IU] via SUBCUTANEOUS
  Administered 2021-12-06: 2 [IU] via SUBCUTANEOUS
  Administered 2021-12-06: 3 [IU] via SUBCUTANEOUS

## 2021-12-03 MED ORDER — ACETAMINOPHEN 650 MG RE SUPP: 650.0000 mg | RECTAL | Status: DC | PRN

## 2021-12-03 MED ORDER — CLOPIDOGREL BISULFATE 75 MG PO TABS
75.0000 mg | ORAL_TABLET | Freq: Every day | ORAL | Status: DC
  Administered 2021-12-03: 75 mg via ORAL
  Filled 2021-12-03: qty 1

## 2021-12-03 MED ORDER — ACETAMINOPHEN 160 MG/5ML PO SOLN: 650.0000 mg | ORAL | Status: DC | PRN

## 2021-12-03 MED ORDER — INSULIN GLARGINE-YFGN 100 UNIT/ML ~~LOC~~ SOLN
15.0000 [IU] | Freq: Every day | SUBCUTANEOUS | Status: DC
  Administered 2021-12-03: 15 [IU] via SUBCUTANEOUS
  Filled 2021-12-03 (×2): qty 0.15

## 2021-12-03 MED ORDER — TICAGRELOR 90 MG PO TABS
90.0000 mg | ORAL_TABLET | Freq: Two times a day (BID) | ORAL | Status: DC
  Administered 2021-12-03 – 2021-12-06 (×6): 90 mg via ORAL
  Filled 2021-12-03 (×6): qty 1

## 2021-12-03 MED ORDER — ATORVASTATIN CALCIUM 80 MG PO TABS
80.0000 mg | ORAL_TABLET | Freq: Every day | ORAL | Status: DC
  Administered 2021-12-03 – 2021-12-04 (×2): 80 mg via ORAL
  Filled 2021-12-03 (×2): qty 1

## 2021-12-03 MED ORDER — SODIUM CHLORIDE 0.9 % IV SOLN: Freq: Once | INTRAVENOUS | Status: AC

## 2021-12-03 MED ORDER — ASPIRIN 81 MG PO CHEW
81.0000 mg | CHEWABLE_TABLET | Freq: Every day | ORAL | Status: DC
  Administered 2021-12-03 – 2021-12-06 (×4): 81 mg via ORAL
  Filled 2021-12-03 (×4): qty 1

## 2021-12-03 MED ORDER — INSULIN ASPART 100 UNIT/ML IJ SOLN
0.0000 [IU] | Freq: Every day | INTRAMUSCULAR | Status: DC
  Administered 2021-12-03: 3 [IU] via SUBCUTANEOUS
  Administered 2021-12-04 – 2021-12-05 (×2): 2 [IU] via SUBCUTANEOUS

## 2021-12-03 MED ORDER — ACETAMINOPHEN 325 MG PO TABS
650.0000 mg | ORAL_TABLET | ORAL | Status: DC | PRN
  Administered 2021-12-04: 650 mg via ORAL
  Filled 2021-12-03: qty 2

## 2021-12-03 NOTE — ED Triage Notes (Addendum)
Patient states that when he got off of work yesterday, he had numbness from the right knee to the right foot and right hand. Patient states the numbness went away after 5-6 minutes. ? ?Patient states today he had light headedness and numbness from the right knee to right foot and right hand again. Patient states feeling came back after approx 10 minutes. ? ? ?CBG in triage-304. Patient states he has not had any insulin in a week because his insulin pen is broken. ?

## 2021-12-03 NOTE — Consult Note (Signed)
Neurology Consultation ?Reason for Consult: Right-sided numbness and weakness ?Referring Physician: Marylyn Ishihara, T ? ?CC: Right-sided numbness and weakness ? ?History is obtained from: Patient ? ?HPI: Tommy Jimenez is a 56 y.o. male with a history of diabetes, cardiomyopathy, hypercholesterolemia, coronary artery disease on aspirin and Brilinta who presents with two episodes of right-sided numbness and weakness.  He states that he had left work, and his right leg suddenly went numb and weak.  It only involves the leg at the time, lasted approximately 10 minutes.  Today, he had another episode while washing dishes, he had to hold onto a sink to prevent falling.  Today he had involved his right hand as well as his right leg and therefore he sought care in the emergency department.  He has been admitted for TIA work-up. ? ? ?LKW: 3/16 ?tpa given?: no, out of window ?NIHSS: Zero ? ? ?ROS: A 14 point ROS was performed and is negative except as noted in the HPI.  ?Past Medical History:  ?Diagnosis Date  ? Coronary artery disease   ? stent placed  ? Dilated cardiomyopathy ([place])   ? DM (diabetes mellitus) ([place]) 1996  ? DVT (deep venous thrombosis) ([place])   ? right leg   ? Erectile dysfunction   ? H/O acute myocardial infarction   ? H/O heart artery stent   ? History of DVT (deep vein thrombosis)   ? HTN (hypertension)   ? Ischemic cardiomyopathy   ? Myocardial infarction [facility])   ? Obesity   ? Peripheral vascular disease ([place])   ? Pure hypercholesterolemia   ? ? ? ?Family History  ?Problem Relation Age of Onset  ? Diabetes Mother   ?     complications of diabetes  ? Heart disease Father   ?     died of MI, died age 74yo  ? Hypertension Father   ? Multiple sclerosis Sister   ? Cancer Brother   ?     brain tumor  ? Diabetes Brother   ? Lupus Sister   ? Lupus Sister   ? Stroke Neg Hx   ? ? ? ?Social History:  reports that he has quit smoking. His smoking use included cigarettes. He has never used smokeless tobacco. He reports  current alcohol use. He reports that he does not use drugs. ? ? ?Exam: ?Current vital signs: ?BP 140/74 (BP Location: Right Arm)   Pulse 87   Temp 98.3 ?F (36.8 ?C) (Oral)   Resp 14   Ht '5\' 8"'$  (1.727 m)   Wt 70.3 kg   SpO2 100%   BMI 23.57 kg/m?  ?Vital signs in last 24 hours: ?Temp:  [97.5 ?F (36.4 ?C)-98.5 ?F (36.9 ?C)] 98.3 ?F (36.8 ?C) (03/17 1841) ?Pulse Rate:  [82-100] 87 (03/17 1841) ?Resp:  [13-19] 14 (03/17 1841) ?BP: (139-166)/(74-102) 140/74 (03/17 1841) ?SpO2:  [98 %-100 %] 100 % (03/17 1841) ?Weight:  [70.3 kg] 70.3 kg (03/17 1208) ? ? ?Physical Exam  ?Constitutional: Appears well-developed and well-nourished.  ?Psych: Affect appropriate to situation ?Eyes: No scleral injection ?HENT: No OP obstruction ?MSK: no joint deformities.  ?Cardiovascular: Normal rate and regular rhythm.  ?Respiratory: Effort normal, non-labored breathing ?GI: Soft.  No distension. There is no tenderness.  ?Skin: WDI ? ?Neuro: ?Mental Status: ?Patient is awake, alert, oriented to person, place, month, year, and situation. ?Patient is able to give a clear and coherent history. ?No signs of aphasia or neglect ?Cranial Nerves: ?II: Visual Fields are full. Pupils are equal, round,  and reactive to light.   ?III,IV, VI: EOMI without ptosis or diploplia.  ?V: Facial sensation is symmetric to temperature ?VII: Facial movement is symmetric.  ?VIII: hearing is intact to voice ?X: Uvula elevates symmetrically ?XI: Shoulder shrug is symmetric. ?XII: tongue is midline without atrophy or fasciculations.  ?Motor: ?Tone is normal. Bulk is normal. 5/5 strength was present in all four extremities with the possible exception of mild hip flexion weakness on the right, but no drift ?Sensory: ?Sensation is symmetric to light touch and temperature in the arms and legs. ?Cerebellar: ?No clear ataxia on finger-nose-finger ? ? ? ? ?I have reviewed labs in epic and the results pertinent to this consultation are: ?Creatinine 0.87 ?Sodium 132 ? ?I  have reviewed the images obtained: CT head-negative ? ?Impression: 56 year old male with two episodes of right-sided numbness and weakness most consistent with recurrent TIA.  He is being admitted for secondary risk factor stratification and modification. he is already on dual antiplatelet therapy with aspirin and Brilinta and I would continue this. ? ?Recommendations: ?- HgbA1c, fasting lipid panel ?- MRI of the brain without contrast ?- Frequent neuro checks ?- Echocardiogram ?- CTA head and neck ?- Prophylactic therapy-Antiplatelet med: Aspirin - dose '81mg'$  and Brilinta 90 mg twice daily ?- Risk factor modification ?- Telemetry monitoring ?- PT consult, OT consult, Speech consult ?- Stroke team to follow ? ? ? ?Roland Rack, MD ?[facility] ?[phone] ? ?If 7pm- 7am, please page neurology on call as listed in [place]. ? ?

## 2021-12-03 NOTE — H&P (Signed)
?History and Physical  ? ? ?Patient: Tommy Jimenez WNI:[id number] DOB: Sep 06, 1966 ?DOA: 12/03/2021 ?DOS: the patient was seen and examined on 12/03/2021 ?PCP: [name], PA-C  ?Patient coming from: Home ? ?Chief Complaint:  ?Chief Complaint  ?Patient presents with  ? Numbness  ? ?HPI: Tommy Jimenez is a 56 y.o. male with medical history significant of CAD, DM2, HTN, HLD. Presenting with RLE/RUE weakness and numbness. He first noticed his symptoms yesterday afternoon after leaving work. He got in his car and immediately felt that he couldn't move his right leg. It felt numb. This lasted for 10 - 15 minutes and then everything returned to normal. So he went home and didn't think about it. This morning while at work, he had another episode. This time it included his right arm. He had some dizziness as well. This episode lasted for 10 minutes and then resolved. He became concerned and came to the ED for evaluation. He denies any other aggravating or alleviating factors. ? ?Review of Systems: As mentioned in the history of present illness. All other systems reviewed and are negative. ?Past Medical History:  ?Diagnosis Date  ? Coronary artery disease   ? stent placed  ? Dilated cardiomyopathy ([place])   ? DM (diabetes mellitus) ([place]) 1996  ? DVT (deep venous thrombosis) ([place])   ? right leg   ? Erectile dysfunction   ? H/O acute myocardial infarction   ? H/O heart artery stent   ? History of DVT (deep vein thrombosis)   ? HTN (hypertension)   ? Ischemic cardiomyopathy   ? Myocardial infarction [facility])   ? Obesity   ? Peripheral vascular disease ([place])   ? Pure hypercholesterolemia   ? ?Past Surgical History:  ?Procedure Laterality Date  ? CORONARY STENT PLACEMENT    ? INCISION AND DRAINAGE PERIRECTAL ABSCESS Left 11/20/2017  ? Procedure: IRRIGATION AND DEBRIDEMENT PERIRECTAL ABSCESS;  Surgeon: Stark Klein, MD;  Location: [facility] OR[facility];  Service: General;  Laterality: Left;  ? ?Social History:  reports that he has quit  smoking. His smoking use included cigarettes. He has never used smokeless tobacco. He reports current alcohol use. He reports that he does not use drugs. ? ?No Known Allergies ? ?Family History  ?Problem Relation Age of Onset  ? Diabetes Mother   ?     complications of diabetes  ? Heart disease Father   ?     died of MI, died age 49yo  ? Hypertension Father   ? Multiple sclerosis Sister   ? Cancer Brother   ?     brain tumor  ? Diabetes Brother   ? Lupus Sister   ? Lupus Sister   ? Stroke Neg Hx   ? ? ?Prior to Admission medications   ?Medication Sig Start Date End Date Taking? Authorizing Provider  ?aspirin EC 81 MG tablet Take 81 mg by mouth daily.   Yes [provider]  ?Insulin Isophane & Regular Human (NOVOLIN 70/30 FLEXPEN) (70-30) 100 UNIT/ML PEN Inject 35 Units into the skin 2 (two) times daily. ?Patient taking differently: Inject 28 Units into the skin daily. 05/08/19  Yes Law, Bea Graff, PA-C  ?metoprolol tartrate (LOPRESSOR) 25 MG tablet TAKE 1 & 1/2 (ONE & ONE-HALF) TABLETS BY MOUTH TWICE DAILY ?Patient taking differently: Take 37.5 mg by mouth 2 (two) times daily. 05/08/19  Yes Law, Bea Graff, PA-C  ?Ticagrelor (BRILINTA PO) Take 1 tablet by mouth daily.   Yes [provider]  ?atorvastatin (LIPITOR) 80 MG  tablet Take 1 tablet (80 mg total) by mouth daily. ?Patient not taking: Reported on 12/03/2021 05/08/19   Frederica Kuster, PA-C  ?glipiZIDE (GLUCOTROL XL) 10 MG 24 hr tablet Take 10 mg by mouth daily. ?Patient not taking: Reported on 12/03/2021    [provider]  ?lisinopril (ZESTRIL) 5 MG tablet Take 1 tablet (5 mg total) by mouth daily. ?Patient not taking: Reported on 12/03/2021 05/08/19   Frederica Kuster, PA-C  ?tadalafil (CIALIS) 10 MG tablet 1/2-1 tablet prn ?Patient not taking: Reported on 10/10/2018 12/28/17   Carlena Hurl, PA-C  ? ? ?Physical Exam: ?Vitals:  ? 12/03/21 1400 12/03/21 1401 12/03/21 1430 12/03/21 1500  ?BP: (!) 163/95  (!) 157/87 (!) 166/94  ?Pulse:  92 88 92 88  ?Resp: '19  14 13  '$ ?Temp:      ?TempSrc:      ?SpO2: 98%  98% 99%  ?Weight:      ?Height:      ? ?General: 56 y.o. male resting in bed in NAD ?Eyes: PERRL, normal sclera ?ENMT: Nares patent w/o discharge, orophaynx clear, dentition normal, ears w/o discharge/lesions/ulcers ?Neck: Supple, trachea midline ?Cardiovascular: RRR, +S1, S2, no m/g/r, equal pulses throughout ?Respiratory: CTABL, no w/r/r, normal WOB ?GI: BS+, NDNT, no masses noted, no organomegaly noted ?MSK: No e/c/c ?Neuro: A&O x 3, msk strg is 4-5/5 in RLE; otherwise no focality on exam ?Psyc: Appropriate interaction and affect, calm/cooperative ? ?Data Reviewed: ? ?Na+  132 ?Glucose  300 ?Hgb 12.6 ? ?CTH: 1. No acute intracranial process identified. 2. 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 ? ?Assessment and Plan: ?No notes have been filed under this hospital service. ?Service: Hospitalist ?RLE/RUE weakness/numbness ?Possible TIA ?    - place in obs, tele @ [place] ?    - CTH as above ?    - order MRI, echo ?    - check A1c, lipid panel ?    - PT/OT ?    - permissive HTN for now ?    - started on plavix/ASA; start statin ?    - neuro onboard, appreciate assistance ? ?DM2 uncontrolled ?    - semglee 15 units, titrate as necessary ?    - SSI, glucose checks ?    - NPO for now; but if he passes swallow screen, place on DM diet ?    - DM coordinator ? ?HTN ?    - permissive HTN for now ? ?Hyponatremia ?    - mild, follow ? ?Hypocalcemia ?    - mild, follow ? ?CAD ?HLD ?    - holding anti-hypertensives for now; on plavix/ASA, resuming statin (he has been non-compliant) ? ?Tobacco abuse ?    - counseled against further use ? ?Normocytic anemia ?    - no evidence of bleed; check iron studies, follow ? ?Advance Care Planning:   Code Status: FULL ? ?Consults: EDP spoke w/ neurology ? ?Family Communication: None at bedside ? ?Severity of Illness: ?The appropriate patient status for this patient is OBSERVATION. Observation status is judged to be  reasonable and necessary in order to provide the required intensity of service to ensure the patient's safety. The patient's presenting symptoms, physical exam findings, and initial radiographic and laboratory data in the context of their medical condition is felt to place them at decreased risk for further clinical deterioration. Furthermore, it is anticipated that the patient will be medically stable for discharge from the hospital within 2 midnights of admission.  ? ?  Author: ?Jonnie Finner, DO ?12/03/2021 3:13 PM ? ?For on call review [email or web address].  ?

## 2021-12-03 NOTE — ED Provider Notes (Signed)
?[facility] DEPT ?Provider Note ? ? ?CSN: [id number] ?Arrival date & time: 12/03/21  1201 ? ?  ? ?History ? ?Chief Complaint  ?Patient presents with  ? Numbness  ? ? ?Tommy Jimenez is a 56 y.o. male who presents emergency department complaining 2 episodes of right leg weakness starting yesterday.  Patient states when he got off work yesterday he did numbness and weakness in his right knee to his right foot, as well as numbness in his right hand.  He stated that that numbness went away after 5 to 6 minutes.  Today while he was at work he had a similar episode with lightheadedness.  He states the feeling came back after approximately 10 minutes.  He states he has not been taking his insulin for the past week as the insulin pen is broken.  He has been feeling well leading up until today.  He is not complaining of any symptoms at this time. ? ?No history of stroke.  He has a history of CAD with multiple stents, and is on Brilinta. He reports no missed doses except for today.  ? ?HPI ? ?  ? ?Home Medications ?Prior to Admission medications   ?Medication Sig Start Date End Date Taking? Authorizing Provider  ?aspirin EC 81 MG tablet Take 81 mg by mouth daily.   Yes [provider]  ?Insulin Isophane & Regular Human (NOVOLIN 70/30 FLEXPEN) (70-30) 100 UNIT/ML PEN Inject 35 Units into the skin 2 (two) times daily. ?Patient taking differently: Inject 28 Units into the skin daily. 05/08/19  Yes Law, Bea Graff, PA-C  ?metoprolol tartrate (LOPRESSOR) 25 MG tablet TAKE 1 & 1/2 (ONE & ONE-HALF) TABLETS BY MOUTH TWICE DAILY ?Patient taking differently: Take 37.5 mg by mouth 2 (two) times daily. 05/08/19  Yes Law, Bea Graff, PA-C  ?Ticagrelor (BRILINTA PO) Take 1 tablet by mouth daily.   Yes [provider]  ?atorvastatin (LIPITOR) 80 MG tablet Take 1 tablet (80 mg total) by mouth daily. ?Patient not taking: Reported on 12/03/2021 05/08/19   Frederica Kuster, PA-C  ?glipiZIDE  (GLUCOTROL XL) 10 MG 24 hr tablet Take 10 mg by mouth daily. ?Patient not taking: Reported on 12/03/2021    [provider]  ?lisinopril (ZESTRIL) 5 MG tablet Take 1 tablet (5 mg total) by mouth daily. ?Patient not taking: Reported on 12/03/2021 05/08/19   Frederica Kuster, PA-C  ?tadalafil (CIALIS) 10 MG tablet 1/2-1 tablet prn ?Patient not taking: Reported on 10/10/2018 12/28/17   Carlena Hurl, PA-C  ?   ? ?Allergies    ?Patient has no known allergies.   ? ?Review of Systems   ?Review of Systems  ?Constitutional:  Negative for chills and fever.  ?Eyes:  Positive for visual disturbance.  ?Respiratory:  Negative for shortness of breath.   ?Cardiovascular:  Negative for chest pain.  ?Gastrointestinal:  Negative for nausea.  ?Musculoskeletal:  Negative for arthralgias and myalgias.  ?Neurological:  Positive for weakness, light-headedness and numbness. Negative for dizziness, seizures, syncope, facial asymmetry, speech difficulty and headaches.  ?All other systems reviewed and are negative. ? ?Physical Exam ?Updated Vital Signs ?BP (!) 160/102   Pulse 92   Temp (!) 97.5 ?F (36.4 ?C) (Oral)   Resp 16   Ht '5\' 8"'$  (1.727 m)   Wt 70.3 kg   SpO2 99%   BMI 23.57 kg/m?  ?Physical Exam ?Vitals and nursing note reviewed.  ?Constitutional:   ?   Appearance: Normal appearance.  ?HENT:  ?  Head: Normocephalic and atraumatic.  ?Eyes:  ?   Conjunctiva/sclera: Conjunctivae normal.  ?Cardiovascular:  ?   Rate and Rhythm: Normal rate and regular rhythm.  ?   Pulses:     ?     Posterior tibial pulses are 2+ on the right side and 2+ on the left side.  ?Pulmonary:  ?   Effort: Pulmonary effort is normal. No respiratory distress.  ?   Breath sounds: Normal breath sounds.  ?Abdominal:  ?   General: There is no distension.  ?   Palpations: Abdomen is soft.  ?   Tenderness: There is no abdominal tenderness.  ?Musculoskeletal:  ?   Right lower leg: No edema.  ?   Left lower leg: No edema.  ?Skin: ?   General: Skin is warm and  dry.  ?Neurological:  ?   General: No focal deficit present.  ?   Mental Status: He is alert.  ?   Comments: Neuro: Speech is clear, able to follow commands. CN III-XII intact grossly intact. PERRLA. EOMI. Sensation intact throughout. Str 5/5 all extremities.  ? ? ?ED Results / Procedures / Treatments   ?Labs ?(all labs ordered are listed, but only abnormal results are displayed) ?Labs Reviewed  ?CBC WITH DIFFERENTIAL/PLATELET - Abnormal; Notable for the following components:  ?    Result Value  ? Hemoglobin 12.6 (*)   ? HCT 37.9 (*)   ? All other components within normal limits  ?COMPREHENSIVE METABOLIC PANEL - Abnormal; Notable for the following components:  ? Sodium 132 (*)   ? Glucose, Bld 300 (*)   ? Calcium 8.6 (*)   ? All other components within normal limits  ?CBG MONITORING, ED - Abnormal; Notable for the following components:  ? Glucose-Capillary 304 (*)   ? All other components within normal limits  ?MAGNESIUM  ? ? ?EKG ?None ? ?Radiology ?CT Head Wo Contrast ? ?Result Date: 12/03/2021 ?CLINICAL DATA:  TIA, numbness EXAM: CT HEAD WITHOUT CONTRAST TECHNIQUE: Contiguous axial images were obtained from the base of the skull through the vertex without intravenous contrast. RADIATION DOSE REDUCTION: This exam was performed according to the departmental dose-optimization program which includes automated exposure control, adjustment of the mA and/or kV according to patient size and/or use of iterative reconstruction technique. COMPARISON:  MRI head 08/02/2013 FINDINGS: Brain: No acute intracranial hemorrhage, mass effect, or herniation. No extra-axial fluid collections. No evidence of acute territorial infarct. No hydrocephalus. Multiple foci of encephalomalacia in the bilateral cerebellum left greater than right, consistent with old infarcts. Vascular: No hyperdense vessel or unexpected calcification. Skull: Normal. Negative for fracture or focal lesion. Sinuses/Orbits: No acute finding. Other: None. IMPRESSION:  1. No acute intracranial process identified. 2. 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 Electronically Signed   By: Ofilia Neas M.D.   On: 12/03/2021 13:30   ? ?Procedures ?Procedures  ? ? ?Medications Ordered in ED ?Medications  ?aspirin chewable tablet 81 mg (has no administration in time range)  ?clopidogrel (PLAVIX) tablet 75 mg (has no administration in time range)  ?insulin glargine-yfgn (SEMGLEE) injection 15 Units (has no administration in time range)  ? ? ?ED Course/ Medical Decision Making/ A&P ?  ?                        ?Medical Decision Making ?Amount and/or Complexity of Data Reviewed ?Labs: ordered. ?Radiology: ordered. ? ?Risk ?Decision regarding hospitalization. ? ? ?This patient presents to the ED for concern of weakness,  this involves an extensive number of treatment options, and is a complaint that carries with it a high risk of complications and morbidity. The emergent differential diagnosis prior to evaluation includes, but is not limited to,  Neurologic causes (GBS, myasthenia gravis, CVA, MS, ALS, transverse myelitis, spinal cord injury, CVA, botulism); ACS, Arrhythmia, syncope, orthostatic hypotension, sepsis, hypoglycemia, electrolyte disturbance, hypothyroidism, respiratory failure, symptomatic anemia, dehydration, heat injury, polypharmacy, malignancy.  ? ?This is not an exhaustive differential.  ? ?Past Medical History / Co-morbidities / Social History: ?Hypertension, diabetes, CAD with stent, peripheral vascular disease ? ?Physical Exam: ?Physical exam performed. The pertinent findings include: Normal neurologic exam as above.  Patient is not complaining of any symptoms at this time. ? ?Lab Tests: ?I ordered, and personally interpreted labs.  The pertinent results include: No leukocytosis.  Glucose of 300, otherwise CMP unremarkable.  Normal magnesium level. ?  ?Imaging Studies: ?I ordered imaging studies including CT head. I independently visualized and interpreted imaging which  showed no acute intracranial process, multiple old infarcts in the cerebellum. I agree with the radiologist interpretation. ?  ?Cardiac Monitoring:  ?The patient was maintained on a cardiac monitor.  My attending

## 2021-12-04 ENCOUNTER — Observation Stay (HOSPITAL_COMMUNITY): Payer: Self-pay

## 2021-12-04 ENCOUNTER — Inpatient Hospital Stay (HOSPITAL_COMMUNITY): Payer: Self-pay

## 2021-12-04 DIAGNOSIS — I6389 Other cerebral infarction: Secondary | ICD-10-CM

## 2021-12-04 DIAGNOSIS — E11628 Type 2 diabetes mellitus with other skin complications: Secondary | ICD-10-CM

## 2021-12-04 DIAGNOSIS — D649 Anemia, unspecified: Secondary | ICD-10-CM

## 2021-12-04 DIAGNOSIS — I2510 Atherosclerotic heart disease of native coronary artery without angina pectoris: Secondary | ICD-10-CM

## 2021-12-04 DIAGNOSIS — E785 Hyperlipidemia, unspecified: Secondary | ICD-10-CM

## 2021-12-04 DIAGNOSIS — I255 Ischemic cardiomyopathy: Secondary | ICD-10-CM

## 2021-12-04 DIAGNOSIS — I639 Cerebral infarction, unspecified: Secondary | ICD-10-CM | POA: Diagnosis present

## 2021-12-04 DIAGNOSIS — Z9861 Coronary angioplasty status: Secondary | ICD-10-CM

## 2021-12-04 DIAGNOSIS — I10 Essential (primary) hypertension: Secondary | ICD-10-CM

## 2021-12-04 DIAGNOSIS — E871 Hypo-osmolality and hyponatremia: Secondary | ICD-10-CM

## 2021-12-04 LAB — ECHOCARDIOGRAM COMPLETE
Area-P 1/2: 3.39 cm2
Calc EF: 17.5 %
Height: 68 in
S' Lateral: 4.7 cm
Single Plane A2C EF: 15.6 %
Single Plane A4C EF: 21.8 %
Weight: 2480 oz

## 2021-12-04 LAB — IRON AND TIBC
Iron: 67 ug/dL (ref 45–182)
Saturation Ratios: 23 % (ref 17.9–39.5)
TIBC: 297 ug/dL (ref 250–450)
UIBC: 230 ug/dL

## 2021-12-04 LAB — GLUCOSE, CAPILLARY
Glucose-Capillary: 201 mg/dL — ABNORMAL HIGH (ref 70–99)
Glucose-Capillary: 231 mg/dL — ABNORMAL HIGH (ref 70–99)
Glucose-Capillary: 153 mg/dL — ABNORMAL HIGH (ref 70–99)
Glucose-Capillary: 236 mg/dL — ABNORMAL HIGH (ref 70–99)

## 2021-12-04 LAB — LIPID PANEL
Cholesterol: 219 mg/dL — ABNORMAL HIGH (ref 0–200)
HDL: 53 mg/dL (ref >40)
LDL Cholesterol: 148 mg/dL — ABNORMAL HIGH (ref 0–99)
Total CHOL/HDL Ratio: 4.1 RATIO
Triglycerides: 88 mg/dL (ref <150)
VLDL: 18 mg/dL (ref 0–40)

## 2021-12-04 LAB — HEMOGLOBIN A1C
Hgb A1c MFr Bld: 8.3 % — ABNORMAL HIGH (ref 4.8–5.6)
Mean Plasma Glucose: 191.51 mg/dL

## 2021-12-04 LAB — HIV ANTIBODY (ROUTINE TESTING W REFLEX): HIV Screen 4th Generation wRfx: NONREACTIVE

## 2021-12-04 MED ORDER — INSULIN GLARGINE-YFGN 100 UNIT/ML ~~LOC~~ SOLN
25.0000 [IU] | Freq: Every day | SUBCUTANEOUS | Status: DC
  Administered 2021-12-04: 25 [IU] via SUBCUTANEOUS
  Filled 2021-12-04 (×2): qty 0.25

## 2021-12-04 MED ORDER — LIVING WELL WITH DIABETES BOOK
Freq: Once | Status: AC
  Filled 2021-12-04: qty 1

## 2021-12-04 MED ORDER — ROSUVASTATIN CALCIUM 20 MG PO TABS
40.0000 mg | ORAL_TABLET | Freq: Every day | ORAL | Status: DC
  Administered 2021-12-05 – 2021-12-06 (×2): 40 mg via ORAL
  Filled 2021-12-04 (×2): qty 2

## 2021-12-04 MED ORDER — IOHEXOL 350 MG/ML SOLN
75.0000 mL | Freq: Once | INTRAVENOUS | Status: AC | PRN
  Administered 2021-12-04: 75 mL via INTRAVENOUS

## 2021-12-04 MED ORDER — INSULIN STARTER KIT- PEN NEEDLES (ENGLISH)
1.0000 | Freq: Once | Status: AC
  Administered 2021-12-04: 1
  Filled 2021-12-04: qty 1

## 2021-12-04 NOTE — Evaluation (Signed)
Physical Therapy Evaluation ?Patient Details ?Name: Tommy Jimenez ?MRN: [id number] ?DOB: Aug 07, 1966 ?Today's Date: 12/04/2021 ? ?History of Present Illness ? The pt is a 56 yo male presenting 3/17 after 2 instances of R-sided weakness and numbness. CT head-negative, MRI revealed small R frontal lobe infarct that does not align with the pt's sx. Suspect cardiac source given hx. PMH includes: diabetes, cardiomyopathy, hypercholesterolemia, coronary artery disease on aspirin and Brilinta. ?  ?Clinical Impression ? Pt in bed upon arrival of PT, agreeable to evaluation at this time. 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 The pt presents with limited deficits in strength and dynamic stability, was able to ambulate in the hallway without assist, complete stairs without need for assist or UE support, and scored 21/24 on DGI. Is safe to return home with friends to check on him as needed, no follow up therapies indicated at this time. Discussed fall risk prevention and BE FAST education with the pt as well. He reports no questions or concerns at this time. No further acute PT needs, thank you for the consult.    ?   ? ?Recommendations for follow up therapy are one component of a multi-disciplinary discharge planning process, led by the attending physician.  Recommendations may be updated based on patient status, additional functional criteria and insurance authorization. ? ?Follow Up Recommendations No PT follow up ? ?  ?Assistance Recommended at Discharge PRN  ?Patient can return home with the following ?   ? ?  ?Equipment Recommendations None recommended by PT  ?Recommendations for Other Services ?    ?  ?Functional Status Assessment Patient has not had a recent decline in their functional status  ? ?  ?Precautions / Restrictions Precautions ?Precautions: Fall ?Restrictions ?Weight Bearing Restrictions: No  ? ?  ? ?Mobility ? Bed  Mobility ?Overal bed mobility: Independent ?  ?  ?  ?  ?  ?  ?  ?  ? ?Transfers ?Overall transfer level: Independent ?  ?  ?  ?  ?  ?  ?  ?  ?General transfer comment: stable through session without LOB or need for UE support ?  ? ?Ambulation/Gait ?Ambulation/Gait assistance: Supervision ?Gait Distance (Feet): 250 Feet ?Assistive device: None ?Gait Pattern/deviations: Step-through pattern, Decreased stride length, Scissoring ?Gait velocity: 0.91 m/s ?Gait velocity interpretation: 1.31 - 2.62 ft/sec, indicative of limited community ambulator ?  ?General Gait Details: pt with x2 instances of scissoring but able to maintain balance and improved stability with continued gait ? ?Stairs ?Stairs: Yes ?Stairs assistance: Independent ?Stair Management: No rails, Alternating pattern, Forwards ?Number of Stairs: 6 ?  ? ?  ? ?Balance Overall balance assessment: Independent ?  ?  ?  ?  ?  ?  ?  ?  ?  ?  ?  ?  ?  ?  ?  ?Standardized Balance Assessment ?Standardized Balance Assessment : Dynamic Gait Index ?  ?Dynamic Gait Index ?Level Surface: Normal ?Change in Gait Speed: Normal ?Gait with Horizontal Head Turns: Mild Impairment ?Gait with Vertical Head Turns: Mild Impairment ?Gait and Pivot Turn: Normal ?Step Over Obstacle: Mild Impairment ?Step Around Obstacles: Normal ?Steps: Normal ?Total Score: 21 ?   ? ? ? ?Pertinent Vitals/Pain Pain Assessment ?Pain Assessment: No/denies pain ?Pain Intervention(s): Monitored during session  ? ? ?Home Living Family/patient expects to be discharged to:: Private residence ?Living Arrangements: Alone ?Available Help at Discharge: Friend(s);Available PRN/intermittently ?Type  of Home: Other(Comment) (townhouse) ?Home Access: Level entry ?  ?  ?Alternate Level Stairs-Number of Steps: ~18 ?Home Layout: 1/2 bath on main level;Bed/bath upstairs;Other (Comment);Two level (bedroom on main level, bathroom upstairs) ?Home Equipment: Grab bars - tub/shower ?   ?  ?Prior Function Prior Level of Function :  Independent/Modified Independent ?  ?  ?  ?  ?  ?  ?Mobility Comments: no AD ?ADLs Comments: independent, pt recently moved into an apartment. 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  ? ? ?Hand Dominance  ? Dominant Hand: Left ? ?  ?Extremity/Trunk Assessment  ? Upper Extremity Assessment ?Upper Extremity Assessment: Defer to OT evaluation ?  ? ?Lower Extremity Assessment ?Lower Extremity Assessment: RLE deficits/detail ?RLE Deficits / Details: noted to be slightly weaker than LLE;RLE grossly 4/5 and LLE noted to be 5/5;pt denies numbness/tingling in leg;hx of peripheral neuropathy;normal heel to shin bilaterally ?RLE Sensation: WNL ?RLE Coordination: WNL ?  ? ?Cervical / Trunk Assessment ?Cervical / Trunk Assessment: Normal  ?Communication  ? Communication: No difficulties  ?Cognition Arousal/Alertness: Awake/alert ?Behavior During Therapy: [facility] for tasks assessed/performed ?Overall Cognitive Status: Within Functional Limits for tasks assessed ?  ?  ?  ?  ?  ?  ?  ?  ?  ?  ?  ?  ?  ?  ?  ?  ?  ?  ?  ? ?  ?General Comments General comments (skin integrity, edema, etc.): VSS through session ? ?  ?   ? ?Assessment/Plan  ?  ?PT Assessment Patient does not need any further PT services  ?   ?   ? ?PT Goals (Current goals can be found in the Care Plan section)  ?Acute Rehab PT Goals ?Patient Stated Goal: return home today ?PT Goal Formulation: With patient ?Time For Goal Achievement: 12/04/21 ?Potential to Achieve Goals: Good ? ?  ? ?AM-PAC PT "6 Clicks" Mobility  ?Outcome Measure Help needed turning from your back to your side while in a flat bed without using bedrails?: None ?Help needed moving from lying on your back to sitting on the side of a flat bed without using bedrails?: None ?Help needed moving to and from a bed to a chair (including a wheelchair)?: None ?Help needed standing up from a chair using your arms (e.g.,  wheelchair or bedside chair)?: None ?Help needed to walk in hospital room?: None ?Help needed climbing 3-5 steps with a railing? : None ?6 Click Score: 24 ? ?  ?End of Session Equipment Utilized During Treatment: Gait belt ?Activity Tolerance: Patient tolerated treatment well ?Patient left: in bed;with call bell/phone within reach ?Nurse Communication: Mobility status ?PT Visit Diagnosis: Unsteadiness on feet (R26.81);Other abnormalities of gait and mobility (R26.89) ?  ? ?Time: 6389-3734 ?PT Time Calculation (min) (ACUTE ONLY): 11 min ? ? ?Charges:   PT Evaluation ?$PT Eval Low Complexity: 1 Low ?  ?  ?   ? ? ?West Carbo, PT, DPT  ? ?Acute Rehabilitation Department ?Pager #: [phone] - [phone] ? ?Sandra Cockayne ?12/04/2021, 6:01 PM ? ?

## 2021-12-04 NOTE — Assessment & Plan Note (Addendum)
Sodium 132, mild.  Encourage increase oral intake.  Sodium improved to 134 at time of discharge.  Recommend repeat BMP at next PCP visit. ?

## 2021-12-04 NOTE — Assessment & Plan Note (Addendum)
Hemoglobin A1c 8.3, poorly controlled.  Has been having intermittent use of insulin at home due to cost and lack of insurance.  Will discharge home on ReliOn NPH 70/30 22 units subcutaneously twice daily.  On Plavix and statin.  Outpatient follow-up with PCP scheduled will need likely further titration and monitoring.  Follow-up C-peptide, that was pending at time of discharge. ?

## 2021-12-04 NOTE — Assessment & Plan Note (Addendum)
History of STEMI and brief cardiac arrest 2018; went emergently to the Cath Lab with notable acute occlusion ostial LAD s/p PCI[facility]stent placement at [facility]. Cardiology to consider myocardial nuclear perfusion study outpatient.  Continue Crestor, Plavix and Eliquis. ?

## 2021-12-04 NOTE — Progress Notes (Signed)
SLP Cancellation Note ? ?Patient Details ?Name: Tommy Jimenez ?MRN: [id number] ?DOB: November 13, 1965 ? ? ?Cancelled treatment:       Reason Eval/Treat Not Completed: SLP screened, no needs identified, will sign off ? ? ?[name] ?12/04/2021, 11:43 AM ?

## 2021-12-04 NOTE — Progress Notes (Signed)
*  PRELIMINARY RESULTS* ?Echocardiogram ?2D Echocardiogram has been performed. ? ?Tommy Jimenez ?12/04/2021, 4:10 PM ?

## 2021-12-04 NOTE — Assessment & Plan Note (Signed)
Counseled on need for complete cessation. ?

## 2021-12-04 NOTE — Assessment & Plan Note (Addendum)
Patient presenting to ED with recurrent right lower extremity followed by right upper/lower extremity weakness and numbness with associated dizziness.  MRI brain with small focus of acute ischemia right frontal cortex.  MRA head/neck with no emergent large vessel occlusion or high-grade stenosis.  Hemoglobin A1c 8.3, LDL 148.  Seen by PT/OT/SLP with no needs identified.  TTE with LVEF 20-25%.  TEE with no LV thrombus but positive bubble study suggestive for PFO.  Neurology was consulted and followed during hospital course.  Will discharge home on Plavix 75 mg p.o. daily, Eliquis 5 mg p.o. twice daily, Crestor 40 mg p.o. daily.  30-day event monitor will be mailed to patient's home.  Outpatient follow-up with neurology 4 weeks. ? ?

## 2021-12-04 NOTE — Assessment & Plan Note (Signed)
Calcium 8.6, slightly low.  Outpatient follow-up PCP. ?

## 2021-12-04 NOTE — Assessment & Plan Note (Addendum)
Lipid panel with total cholesterol 219, HDL 53, LDL 148, triglycerides 88.  Was previously on atorvastatin 80 mg p.o. daily, but stopped it due to muscle cramps causing him difficulty with standing. Started on Crestor 40 mg p.o. daily.  Will need repeat LFTs/lipid panel 6/8 weeks and if LDL still greater than 55, cardiology will refer to Pharm.D. for PCSK9i.  ?

## 2021-12-04 NOTE — Progress Notes (Signed)
Inpatient Diabetes Program Recommendations ? ?AACE/ADA: New Consensus Statement on Inpatient Glycemic Control (2015) ? ?Target Ranges:  Prepandial:   less than 140 mg/dL ?     Peak postprandial:   less than 180 mg/dL (1-2 hours) ?     Critically ill patients:  140 - 180 mg/dL  ? ?Lab Results  ?Component Value Date  ? GLUCAP 231 (H) 12/04/2021  ? HGBA1C 8.3 (H) 12/04/2021  ? ? ?Review of Glycemic Control ? Latest Reference Range & Units 12/03/21 12:09 12/03/21 18:05 12/03/21 22:21 12/04/21 06:02 12/04/21 11:23  ?Glucose-Capillary 70 - 99 mg/dL 304 (H) 183 (H) 283 (H) 201 (H) 231 (H)  ?(H): Data is abnormally high ? ?Diabetes history: DM2 ?Outpatient Diabetes medications: 70/30 28 units qd ?Current orders for Inpatient glycemic control: Semglee 25 units, Novolog 0-15 units tid, 0-5 units hs ? ?Inpatient Diabetes Program Recommendations:   ?Spoke with patient  via phone regarding diabetes management (DM coordinator on call 8-5 Saturday & Sunday). ?Patient has only been taking his insulin once daily to try to stretch out as long as possible. Pt. Understood his coverage will start in 3 weeks. Message sent to [place] regarding coverage of meds. DM coordinator will provide meter and supplies needed since no insurance @ this time. ?Reviewed with patient option of insulin pens from [employer] may be cheaper than vial and syringe since vial will expire monthly. Patient prefers insulin pens and willing to change to bid ac meals if needed. Reviewed A1 8.3 which is down from previous 11. ?Ordered Living Well With Diabetes for patient review. ? ?May consider on discharge: ?-Novolin Relion insulin 20 units bid ac meals (approximately 28 units basal, 12 units meal coverage). ? ?Thank you, ?Nani Gasser Saranne Crislip, RN, MSN, CDE  ?Diabetes Coordinator ?Inpatient Glycemic Control Team ?Team Pager [phone] (8am-5pm) ?12/04/2021 12:07 PM ? ? ? ? ?

## 2021-12-04 NOTE — Assessment & Plan Note (Addendum)
Metoprolol succinate 12.5 mg p.o. daily, losartan 12.5 mg p.o. daily.  Outpatient follow-up with cardiology for further titration. ?

## 2021-12-04 NOTE — Progress Notes (Signed)
STROKE TEAM PROGRESS NOTE  ? ?SUBJECTIVE (INTERVAL HISTORY) ?No family is at the bedside.  Overall his condition is completely resolved.  Patient stated that he had a 2 episode of right arm and leg numbness and weakness, first time happened Thursday afternoon and then yesterday morning, both lasted about 10 minutes and resolved.  Currently he is at his baseline. ? ?Of note, patient had STEMI, cardiac arrest in 05/2017 status post stenting.  EF 35 to 40% at the time.  He is taking aspirin Brilinta, however not compliant with Lipitor 80.  He did not follow-up with cardiology closely. ? ? ?OBJECTIVE ?Temp:  [98 ?F (36.7 ?C)-98.9 ?F (37.2 ?C)] 98.6 ?F (37 ?C) (03/18 1123) ?Pulse Rate:  [82-92] 91 (03/18 1123) ?Cardiac Rhythm: Normal sinus rhythm (03/18 0814) ?Resp:  [13-19] 16 (03/18 1123) ?BP: (136-160)/(74-102) 144/78 (03/18 1123) ?SpO2:  [98 %-100 %] 100 % (03/18 1123) ? ?Recent Labs  ?Lab 12/03/21 ?1209 12/03/21 ?1805 12/03/21 ?2221 12/04/21 ?0602 12/04/21 ?1123  ?GLUCAP 304* 183* 283* 201* 231*  ? ?Recent Labs  ?Lab 12/03/21 ?1241  ?NA 132*  ?K 4.4  ?CL 101  ?CO2 24  ?GLUCOSE 300*  ?BUN 18  ?CREATININE 0.87  ?CALCIUM 8.6*  ?MG 2.1  ? ?Recent Labs  ?Lab 12/03/21 ?1241  ?AST 15  ?ALT 12  ?ALKPHOS 72  ?BILITOT 0.5  ?PROT 7.5  ?ALBUMIN 4.0  ? ?Recent Labs  ?Lab 12/03/21 ?1241  ?WBC 8.9  ?NEUTROABS 4.8  ?HGB 12.6*  ?HCT 37.9*  ?MCV 85.9  ?PLT 322  ? ?No results for input(s): CKTOTAL, CKMB, CKMBINDEX, TROPONINI in the last 168 hours. ?No results for input(s): LABPROT, INR in the last 72 hours. ?No results for input(s): COLORURINE, LABSPEC, [place], GLUCOSEU, HGBUR, BILIRUBINUR, KETONESUR, PROTEINUR, UROBILINOGEN, NITRITE, LEUKOCYTESUR in the last 72 hours. ? ?Invalid input(s): APPERANCEUR  ?   ?Component Value Date/Time  ? CHOL 219 (H) 12/04/2021 0106  ? CHOL 184 05/10/2018 1342  ? TRIG 88 12/04/2021 0106  ? HDL 53 12/04/2021 0106  ? HDL 54 05/10/2018 1342  ? CHOLHDL 4.1 12/04/2021 0106  ? VLDL 18 12/04/2021 0106  ?  [place] 148 (H) 12/04/2021 0106  ? LDLCALC 115 (H) 05/10/2018 1342  ? ?Lab Results  ?Component Value Date  ? HGBA1C 8.3 (H) 12/04/2021  ? ?   ?Component Value Date/Time  ? [place] DETECTED 12/03/2021 1838  ? [place] DETECTED 12/03/2021 1838  ? LABBENZ NONE DETECTED 12/03/2021 1838  ? AMPHETMU NONE DETECTED 12/03/2021 1838  ? [place] DETECTED 12/03/2021 1838  ? LABBARB NONE DETECTED 12/03/2021 1838  ?  ?No results for input(s): ETH in the last 168 hours. ? ?I have personally reviewed the radiological images below and agree with the radiology interpretations. ? ?CT ANGIO HEAD NECK W WO CM ? 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 EXAM: CT ANGIOGRAPHY HEAD AND NECK TECHNIQUE: Multidetector CT imaging of the head and neck was performed using the standard protocol during bolus administration of intravenous contrast. Multiplanar CT image reconstructions and MIPs were obtained to evaluate the vascular anatomy. Carotid stenosis measurements (when applicable) are obtained utilizing NASCET criteria, using the distal internal carotid diameter as the denominator. RADIATION DOSE REDUCTION: This exam was performed according to the departmental dose-optimization program which includes automated exposure control, adjustment of the mA and/or kV according to patient size and/or use of iterative  reconstruction technique. CONTRAST:  86m OMNIPAQUE IOHEXOL 350 MG/ML SOLN COMPARISON:  Brain MRI, head and neck MRA yesterday. Head CT yesterday. FINDINGS: CT HEAD Brain: Small right anterior frontal lobe cortical infarct remains occult by CT. No acute intracranial hemorrhage identified. No midline shift, mass effect, or evidence of intracranial mass lesion. No ventriculomegaly. Chronic left greater than right cerebellar infarcts. Calvarium and skull base: No acute  osseous abnormality identified. Paranasal sinuses: Visualized paranasal sinuses and mastoids are stable and well aerated. Orbits: Visualized orbits and scalp soft tissues are within normal limits. CTA NECK Skeleton: Scattered dental caries and periapical dental lucency. Mild for age cervical spine degeneration. No acute osseous abnormality identified. Upper chest: Negative. Other neck: Negative. Aortic arch: 3 vessel arch configuration. Mild arch atherosclerosis. Right carotid system: Soft and calcified brachiocephalic artery plaque without stenosis. Negative right CCA origin. Mild plaque at the right carotid bifurcation. No stenosis. Left carotid system: Mild plaque at the left ICA origin and bulb without stenosis. Vertebral arteries: Normal proximal right subclavian artery. Highly diminutive right vertebral artery throughout the neck, with the corresponding small bony right cervical transverse foramen. Mild proximal left subclavian artery plaque without stenosis. Normal left vertebral artery origin. Dominant although diminutive and intermittently irregular left vertebral artery is patent to the skull base with no hemodynamically significant stenosis. CTA HEAD Posterior circulation: Dominant left vertebral artery supplies the basilar with mild to moderate irregularity of the left V4 segment (series 11, image 137). No significant stenosis. Non dominant highly diminutive right vertebral artery functionally terminates at the skull base or in PICA. Patent although diminutive basilar artery with mild atherosclerotic irregularity. No high-grade basilar stenosis. Patent PCA origins. SCA is are diminutive. Bilateral PCAs are also diminutive but remain patent. There is moderate long segment stenosis of the left PCA P1 and P2 Anterior circulation: Both ICA siphons are atherosclerotic and irregular but remain patent. On the left there is mild to moderate siphon stenosis. On the right there is moderate to severe siphon  stenosis in the cavernous segment (series 13, image 86) and in the supraclinoid segment (image 89). Both carotid termini are patent. Patent MCA and ACA origins. Azygous appearing ACA anatomy. No definite ACA branch occlusion. Both MCA M1 segments and bifurcations are patent without stenosis. No MCA branch occlusion identified. Bilateral M3 branches are mildly irregular. Venous sinuses: Patent. Anatomic variants: Dominant left and congenitally diminutive right vertebral arteries. 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 Review of the MIP images confirms the above findings IMPRESSION: 1. Negative for large vessel occlusion. 2. Relatively mild extracranial but moderate to severe Intracranial Atherosclerosis: - mild to moderate stenosis of the Distal Left Vertebral Artery which supplies the Basilar (congenital highly diminutive right vertebral.). - up to moderate stenosis Left PCA P1/P2. - up to moderate stenosis diminutive and irregular Left ICA siphon. - tandem moderate to severe stenoses of the diminutive and irregular Right ICA siphon. 3. Small right anterior frontal lobe cortical infarct remains occult by CT. Chronic cerebellar infarcts. No new intracranial abnormality. Electronically Signed   By: HGenevie AnnM.D.   On: 12/04/2021 09:13  ? ?CT Head Wo Contrast ? ?Result Date: 12/03/2021 ?CLINICAL DATA:  TIA, numbness EXAM: CT HEAD WITHOUT CONTRAST TECHNIQUE: Contiguous axial images were obtained from the base of the skull through the vertex without intravenous contrast. RADIATION DOSE REDUCTION: This exam was performed according to the departmental dose-optimization program which includes automated exposure control, adjustment of the mA and/or kV according to patient size and/or use of iterative reconstruction  technique. COMPARISON:  MRI head 08/02/2013 FINDINGS: Brain: No acute intracranial hemorrhage, mass effect, or herniation. No extra-axial fluid collections. No evidence of acute territorial infarct. No hydrocephalus.  Multiple foci of encephalomalacia in the bilateral cerebellum left greater than right, consistent with old infarcts. Vascular: No hyperdense vessel or unexpected calcification. Skull: Normal. Negative for fracture or foc

## 2021-12-04 NOTE — Assessment & Plan Note (Signed)
Hemoglobin 12.6, stable. ?

## 2021-12-04 NOTE — Evaluation (Signed)
Occupational Therapy Evaluation ?Patient Details ?Name: Tommy Jimenez ?MRN: [id number] ?DOB: 1966/08/21 ?Today's Date: 12/04/2021 ? ? ?History of Present Illness Tommy Jimenez is a 56 y.o. male with a history of diabetes, cardiomyopathy, hypercholesterolemia, coronary artery disease on aspirin and Brilinta who presents with two episodes of right-sided numbness and weakness. No tpa given as pt was outside of window. CT head - negative.  ? ?Clinical Impression ?  ?Pt reports feeling close to baseline. He was independent with ADL/IADL and functional mobility. Pt reports resolved sensation in RUE and RLE. He does continue to demonstrate slight weakness in RLE compared to LLE. However, weakness does not impact his functional mobility as he is independent without AD.  ? ?Patient evaluated by Occupational Therapy with no further acute OT needs identified. All education has been completed and the patient has no further questions. See below for any follow-up Occupational Therapy or equipment needs. OT to sign off. Thank you for referral.  ?  ?   ? ?Recommendations for follow up therapy are one component of a multi-disciplinary discharge planning process, led by the attending physician.  Recommendations may be updated based on patient status, additional functional criteria and insurance authorization.  ? ?Follow Up Recommendations ? No OT follow up  ?  ?Assistance Recommended at Discharge    ?Patient can return home with the following   ? ?  ?Functional Status Assessment ? Patient has not had a recent decline in their functional status  ?Equipment Recommendations ? None recommended by OT  ?  ?Recommendations for Other Services   ? ? ?  ?Precautions / Restrictions Precautions ?Precautions: Fall ?Restrictions ?Weight Bearing Restrictions: No  ? ?  ? ?Mobility Bed Mobility ?Overal bed mobility: Independent ?  ?  ?  ?  ?  ?  ?  ?  ? ?Transfers ?Overall transfer level: Independent ?  ?  ?  ?  ?  ?  ?  ?  ?General transfer  comment: slight instability noted initially but pt improved greatly mobilizing in the room independently without device. Balance WNL for challenges to standing balance, sudden stopping and increased gait speed ?  ? ?  ?Balance Overall balance assessment: Independent ?  ?  ?  ?  ?  ?  ?  ?  ?  ?  ?  ?  ?  ?  ?  ?  ?  ?  ?   ? ?ADL either performed or assessed with clinical judgement  ? ?ADL Overall ADL's : Independent ?  ?  ?  ?  ?  ?  ?  ?  ?  ?  ?  ?  ?  ?  ?  ?  ?  ?  ?  ?General ADL Comments: independent with ambulation in room, LB dressing, grooming at sink level, toileting, etc  ? ? ? ?Vision Baseline Vision/History: 0 No visual deficits ?Ability to See in Adequate Light: 0 Adequate ?Patient Visual Report: No change from baseline ?Vision Assessment?: No apparent visual deficits  ?   ?Perception   ?  ?Praxis   ?  ? ?Pertinent Vitals/Pain Pain Assessment ?Pain Assessment: 0-10 ?Pain Score: 6  ?Pain Location: headache ?Pain Descriptors / Indicators: Headache ?Pain Intervention(s): Monitored during session  ? ? ? ?Hand Dominance Left ?  ?Extremity/Trunk Assessment Upper Extremity Assessment ?Upper Extremity Assessment: Overall WFL for tasks assessed (hx of peripheral neuropathy;grossly 5/5) ?  ?Lower Extremity Assessment ?Lower Extremity Assessment: RLE deficits/detail ?RLE Deficits / Details: noted to be slightly  weaker than LLE;RLE grossly 4/5 and LLE noted to be 5/5;pt denies numbness/tingling in leg;hx of peripheral neuropathy;normal heel to shin bilaterally ?RLE Sensation: WNL ?RLE Coordination: WNL ?  ?Cervical / Trunk Assessment ?Cervical / Trunk Assessment: Normal ?  ?Communication Communication ?Communication: No difficulties ?  ?Cognition Arousal/Alertness: Awake/alert ?Behavior During Therapy: [facility] for tasks assessed/performed ?Overall Cognitive Status: Within Functional Limits for tasks assessed ?  ?  ?  ?  ?  ?  ?  ?  ?  ?  ?  ?  ?  ?  ?  ?  ?  ?  ?  ?General Comments  Hr 90s throughout session ? ?   ?Exercises   ?  ?Shoulder Instructions    ? ? ?Home Living Family/patient expects to be discharged to:: Private residence ?Living Arrangements: Alone ?Available Help at Discharge: Friend(s);Available PRN/intermittently ?Type of Home: Other(Comment) (townhouse) ?Home Access: Level entry ?  ?  ?Home Layout: 1/2 bath on main level;Bed/bath upstairs;Other (Comment);Two level (bedroom on main level, bathroom upstairs) ?Alternate Level Stairs-Number of Steps: ~18 ?Alternate Level Stairs-Rails: Can reach both ?Bathroom Shower/Tub: Tub/shower unit ?  ?Bathroom Toilet: Standard ?Bathroom Accessibility: Yes ?How Accessible: Accessible via walker ?Home Equipment: Grab bars - tub/shower ?  ?  ?  ? ?  ?Prior Functioning/Environment Prior Level of Function : Independent/Modified Independent ?  ?  ?  ?  ?  ?  ?Mobility Comments: no AD ?ADLs Comments: independent, pt recently moved into an apartment. 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  ? ?  ?  ?OT Problem List: Impaired sensation ?  ?   ?OT Treatment/Interventions:    ?  ?OT Goals(Current goals can be found in the care plan section) Acute Rehab OT Goals ?Patient Stated Goal: to go home ?OT Goal Formulation: With patient ?Time For Goal Achievement: 12/18/21 ?Potential to Achieve Goals: Good  ?OT Frequency:   ?  ? ?Co-evaluation   ?  ?  ?  ?  ? ?  ?AM-PAC OT "6 Clicks" Daily Activity     ?Outcome Measure Help from another person eating meals?: None ?Help from another person taking care of personal grooming?: None ?Help from another person toileting, which includes using toliet, bedpan, or urinal?: None ?Help from another person bathing (including washing, rinsing, drying)?: None ?Help from another person to put on and taking off regular upper body clothing?: None ?Help from another person to put on and taking off regular lower body clothing?: None ?6 Click Score: 24 ?  ?End of Session  Nurse Communication: Mobility status ? ?Activity Tolerance: Patient tolerated treatment well ?Patient left: in bed;with call bell/phone within reach;with bed alarm set ? ?OT Visit Diagnosis: Other abnormalities of gait and mobility (R26.89)  ?              ?Time: 8588-5027 ?OT Time Calculation (min): 22 min ?Charges:  OT General Charges ?$OT Visit: 1 Visit ?OT Evaluation ?$OT Eval Low Complexity: 1 Low ? ?Helene Kelp OTR/L ?Acute Rehabilitation Services ?Office: [phone] ? ? ?Wyn Forster ?12/04/2021, 10:06 AM ?

## 2021-12-04 NOTE — Hospital Course (Signed)
Tommy Jimenez is a 55 year old male with past medical history significant for type 2 diabetes mellitus, cardiomyopathy, hyperlipidemia, CAD on aspirin and Brilinta who initially presented to [facility] ED on 3/17 with 2 episodes of right-sided numbness and weakness.  After leaving work the day prior, patient noted that his right leg suddenly went numb and weak; lasting approximately 10 minutes.  Today he reported recurrent right leg weakness as well as right hand weakness in which he sought care in the emergency department.  Also reported some dizziness that has resolved. ? ?In the ED, temperature 97.5 ?F, HR 100, RR 16, BP 153/90, SPO2 100% on room air.  Sodium 132, potassium 4.4, chloride 101, CO2 24, glucose 300, BUN 18, creatinine 0.87, glucose 300.  LFTs within normal limits.  WBC 8.9, hemoglobin 12.6, platelets 322.  UDS negative.  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  MR brain without contrast with small focus of acute ischemia in the right frontal cortex, no hemorrhage/mass effect, multiple old cerebellar infarcts and findings of small vessel ischemia that is chronic. MRA head/neck with no emergent large vessel occlusion or high-grade stenosis, moderate stenosis of the mid V2 segment of the left vertebral artery.  Neurology was consulted.  Patient was transferred to [facility] for stroke team evaluation and further work-up under hospitalist service. ? ?

## 2021-12-04 NOTE — Progress Notes (Signed)
?PROGRESS NOTE ? ? ? ?Tommy Jimenez  PXT:[id number] DOB: 1966-07-25 DOA: 12/03/2021 ?PCP: [name], PA-C  ? ? ?Brief Narrative:  ?Tommy Jimenez is a 57 year old male with past medical history significant for type 2 diabetes mellitus, cardiomyopathy, hyperlipidemia, CAD on aspirin and Brilinta who initially presented to [facility] ED on 3/17 with 2 episodes of right-sided numbness and weakness.  After leaving work the day prior, patient noted that his right leg suddenly went numb and weak; lasting approximately 10 minutes.  Today he reported recurrent right leg weakness as well as right hand weakness in which he sought care in the emergency department.  Also reported some dizziness that has resolved. ? ?In the ED, temperature 97.5 ?F, HR 100, RR 16, BP 153/90, SPO2 100% on room air.  Sodium 132, potassium 4.4, chloride 101, CO2 24, glucose 300, BUN 18, creatinine 0.87, glucose 300.  LFTs within normal limits.  WBC 8.9, hemoglobin 12.6, platelets 322.  UDS negative.  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  MR brain without contrast with small focus of acute ischemia in the right frontal cortex, no hemorrhage/mass effect, multiple old cerebellar infarcts and findings of small vessel ischemia that is chronic. MRA head/neck with no emergent large vessel occlusion or high-grade stenosis, moderate stenosis of the mid V2 segment of the left vertebral artery.  Neurology was consulted.  Patient was transferred to [facility] for stroke team evaluation and further work-up under hospitalist service. ?  ? ?  ?Assessment and Plan: ?CVA (cerebral vascular accident) ([place]) ?Patient presenting to ED with recurrent right lower extremity followed by right upper/lower extremity weakness and numbness with associated dizziness.  MRI brain with small focus of acute ischemia right frontal cortex.  MRA head/neck with no emergent large vessel occlusion or high-grade  stenosis.  Hemoglobin A1c 8.3, LDL 148. ?--Neurology following, appreciate assistance ?--TTE: Pending ?--Aspirin 81 mg p.o. daily ?--Brilinta '90mg'$  BID ?--Crestor 40 mg p.o. daily ?--PT/OT/SLP evaluation ?--Monitor on telemetry ?--Plan for TEE and likely loop recorder, per neurology ? ? ?Diabetes mellitus ([place]) ?Hemoglobin A1c 8.3, poorly controlled.  Currently taking insulin 70/30 28 units [place] daily at home. ?--Semglee 25 units [place] daily ?--moderate SSI for coverge ?--CBGs qAC/HS ? ?Hyperlipidemia ?Lipid panel with total cholesterol 219, HDL 53, LDL 148, triglycerides 88.  Was previously on atorvastatin 80 mg p.o. daily, but stopped it due to muscle cramps causing him difficulty with standing. ? ?Essential hypertension, benign ?Home regimen includes metoprolol tartrate 37.5 mg p.o. twice daily. ?--Holding antihypertensives to allow permissive hypertension in the setting of acute CVA ? ?CAD S/P percutaneous coronary angioplasty ?History of STEMI and brief cardiac arrest 2018; went emergently to the Cath Lab with notable acute occlusion ostial LAD s/p PCI/stent placement at [facility]. ?--Continue aspirin and Brilinta ? ?Hyponatremia ?Sodium 132, mild.  Encourage increase oral intake ? ?Hypocalcemia ?Calcium 8.6, slightly low.  Outpatient follow-up PCP. ? ?Normocytic anemia ?Hemoglobin 12.6, stable. ? ?Tobacco abuse ?Counseled on need for complete cessation. ? ? ? ? ? ?DVT prophylaxis: SCD's Start: 12/03/21 1838 ? ?  Code Status: Full Code ?Family Communication: No family present at bedside this morning ? ?Disposition Plan:  ?Level of care: Telemetry Medical ?Status is: Observation ?The patient remains OBS appropriate and will d/c before 2 midnights. ?  ? ?Consultants:  ?Neurology ? ?Procedures:  ?TTE ? ?Antimicrobials:  ?None ? ? ?Subjective: ?Patient seen examined bedside, resting comfortably.  No specific complaints  this morning.  Patient's weakness now resolved.  Seen by neurology, concern  for possible embolic source of his CVA; and will likely need TEE and loop recorder placed.  Patient concerned about cost of medications as he is out of insurance still for another month.  Patient with no other questions or concerns at this time.  Denies headache, no dizziness, no chest pain, no palpitations, no shortness of breath, no abdominal pain, no fever/chills/night sweats, no nausea/vomiting/diarrhea, no current weakness, no fatigue, no paresthesia.  No acute events overnight per nursing staff. ? ?Objective: ?Vitals:  ? 12/03/21 2306 12/04/21 0320 12/04/21 0740 12/04/21 1123  ?BP: (!) 148/84 (!) 143/79 136/76 (!) 144/78  ?Pulse: 89 85 90 91  ?Resp:  '14 14 16  '$ ?Temp: 98.2 ?F (36.8 ?C) 98 ?F (36.7 ?C) 98.9 ?F (37.2 ?C) 98.6 ?F (37 ?C)  ?TempSrc: Oral Oral Oral Oral  ?SpO2: 100% 99% 99% 100%  ?Weight:      ?Height:      ? ? ?Intake/Output Summary (Last 24 hours) at 12/04/2021 1310 ?Last data filed at 12/04/2021 0600 ?Gross per 24 hour  ?Intake --  ?Output 1150 ml  ?Net -1150 ml  ? ?Filed Weights  ? 12/03/21 1208  ?Weight: 70.3 kg  ? ? ?Examination: ? ?Physical Exam: ?GEN: NAD, alert and oriented x 3, wd/wn ?HEENT: NCAT, PERRL, EOMI, sclera clear, MMM ?PULM: CTAB w/o wheezes/crackles, normal respiratory effort, on room air ?CV: RRR w/o M/G/R ?GI: abd soft, NTND, NABS, no R/G/M ?MSK: no peripheral edema, muscle strength globally intact 5/5 bilateral upper/lower extremities ?NEURO: CN II-XII intact, no focal deficits, sensation to light touch intact ?PSYCH: normal mood/affect ?Integumentary: dry/intact, no rashes or wounds ? ? ? ?Data Reviewed: I have personally reviewed following labs and imaging studies ? ?CBC: ?Recent Labs  ?Lab 12/03/21 ?1241  ?WBC 8.9  ?NEUTROABS 4.8  ?HGB 12.6*  ?HCT 37.9*  ?MCV 85.9  ?PLT 322  ? ?Basic Metabolic Panel: ?Recent Labs  ?Lab 12/03/21 ?1241  ?NA 132*  ?K 4.4  ?CL 101  ?CO2 24  ?GLUCOSE 300*  ?BUN 18  ?CREATININE 0.87  ?CALCIUM 8.6*  ?MG 2.1  ? ?GFR: ?Estimated Creatinine Clearance:  93.9 mL/min (by C-G formula based on SCr of 0.87 mg/dL). ?Liver Function Tests: ?Recent Labs  ?Lab 12/03/21 ?1241  ?AST 15  ?ALT 12  ?ALKPHOS 72  ?BILITOT 0.5  ?PROT 7.5  ?ALBUMIN 4.0  ? ?No results for input(s): LIPASE, AMYLASE in the last 168 hours. ?No results for input(s): AMMONIA in the last 168 hours. ?Coagulation Profile: ?No results for input(s): INR, PROTIME in the last 168 hours. ?Cardiac Enzymes: ?No results for input(s): CKTOTAL, CKMB, CKMBINDEX, TROPONINI in the last 168 hours. ?BNP (last 3 results) ?No results for input(s): PROBNP in the last 8760 hours. ?HbA1C: ?Recent Labs  ?  12/04/21 ?0106  ?HGBA1C 8.3*  ? ?CBG: ?Recent Labs  ?Lab 12/03/21 ?1209 12/03/21 ?1805 12/03/21 ?2221 12/04/21 ?0602 12/04/21 ?1123  ?GLUCAP 304* 183* 283* 201* 231*  ? ?Lipid Profile: ?Recent Labs  ?  12/04/21 ?0106  ?CHOL 219*  ?HDL 53  ?LDLCALC 148*  ?TRIG 88  ?CHOLHDL 4.1  ? ?Thyroid Function Tests: ?No results for input(s): TSH, T4TOTAL, FREET4, T3FREE, THYROIDAB in the last 72 hours. ?Anemia Panel: ?Recent Labs  ?  12/04/21 ?0106  ?TIBC 297  ?IRON 67  ? ?Sepsis Labs: ?No results for input(s): PROCALCITON, LATICACIDVEN in the last 168 hours. ? ?No results found for this or any previous visit (from the  past 240 hour(s)).  ? ? ? ? ? ?Radiology Studies: ?CT ANGIO HEAD NECK W WO CM ? 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 EXAM: CT ANGIOGRAPHY HEAD AND NECK TECHNIQUE: Multidetector CT imaging of the head and neck was performed using the standard protocol during bolus administration of intravenous contrast. Multiplanar CT image reconstructions and MIPs were obtained to evaluate the vascular anatomy. Carotid stenosis measurements (when applicable) are obtained utilizing NASCET criteria, using the distal internal carotid diameter as the denominator. RADIATION DOSE REDUCTION:  This exam was performed according to the departmental dose-optimization program which includes automated exposure control, adjustment of the mA and/or kV according to patient size and/or use of iterati

## 2021-12-05 DIAGNOSIS — E7800 Pure hypercholesterolemia, unspecified: Secondary | ICD-10-CM

## 2021-12-05 DIAGNOSIS — Z720 Tobacco use: Secondary | ICD-10-CM

## 2021-12-05 DIAGNOSIS — I6340 Cerebral infarction due to embolism of unspecified cerebral artery: Secondary | ICD-10-CM

## 2021-12-05 DIAGNOSIS — I5022 Chronic systolic (congestive) heart failure: Secondary | ICD-10-CM | POA: Diagnosis present

## 2021-12-05 LAB — GLUCOSE, CAPILLARY
Glucose-Capillary: 228 mg/dL — ABNORMAL HIGH (ref 70–99)
Glucose-Capillary: 213 mg/dL — ABNORMAL HIGH (ref 70–99)
Glucose-Capillary: 175 mg/dL — ABNORMAL HIGH (ref 70–99)
Glucose-Capillary: 225 mg/dL — ABNORMAL HIGH (ref 70–99)
Glucose-Capillary: 206 mg/dL — ABNORMAL HIGH (ref 70–99)

## 2021-12-05 MED ORDER — LOSARTAN POTASSIUM 25 MG PO TABS
12.5000 mg | ORAL_TABLET | Freq: Every day | ORAL | Status: DC
  Administered 2021-12-05 – 2021-12-06 (×2): 12.5 mg via ORAL
  Filled 2021-12-05 (×2): qty 1

## 2021-12-05 MED ORDER — INSULIN GLARGINE-YFGN 100 UNIT/ML ~~LOC~~ SOLN
35.0000 [IU] | Freq: Every day | SUBCUTANEOUS | Status: DC
  Administered 2021-12-05: 35 [IU] via SUBCUTANEOUS
  Filled 2021-12-05 (×2): qty 0.35

## 2021-12-05 MED ORDER — METOPROLOL SUCCINATE ER 25 MG PO TB24
12.5000 mg | ORAL_TABLET | Freq: Every day | ORAL | Status: DC
Start: 2021-12-05 — End: 2021-12-06
  Administered 2021-12-05 – 2021-12-06 (×2): 12.5 mg via ORAL
  Filled 2021-12-05 (×2): qty 1

## 2021-12-05 NOTE — Consult Note (Signed)
?Cardiology Consultation:  ? ?Patient ID: Tommy Jimenez ?MRN: [id number]; DOB: 19-Jan-1966 ? ?Admit date: 12/03/2021 ?Date of Consult: 12/05/2021 ? ?PCP:  [name], PA-C ?  ?[facility] Providers ?Cardiologist:  None      ? ? ?Patient Profile:  ? ?Tommy Jimenez is a 56 y.o. male with a hx of insulin requiring diabetes mellitus since age 28, CAD status post acute anterior MI complicated by cardiac arrest, depressed left ventricular systolic function, hypercholesterolemia, hypertension, who is being seen 12/05/2021 for the evaluation of multiple embolic strokes at the request of Dr. [place] ? ?History of Present Illness:  ? ?[name] he was admitted 12/03/2021 after experiencing at least 2 separate episodes of neurological deficit, right leg loss of sensation and weakness during the first event, weakness in both upper and lower right extremities the second time.  These events occurred while he was taking aspirin and Brilinta, prescribed for coronary disease many years earlier.  His neurological complaints have resolved completely. ? ?Studies show multiple small embolic strokes, but these do not necessarily fit well with the patient's neurological complaints.  There is a strong suspicion for cardioembolic source of stroke. ? ?In 2018 he had proximal 80 artery stenosis causing acute myocardial infarction complicated by cardiac arrest resulting in moderate to severe ischemic cardiomyopathy with EF 30-35%, but without any symptoms of heart failure.  Follow-up with cardiology has been spotty over the years due to social limitations.  He last saw cardiologist in 2020.  He has been receiving ACE inhibitor's and beta-blockers inconsistently. ? ?Over the last several months he has noticed exertional dyspnea, NYHA functional class II (walking roughly 1 block).  This was particularly noticeable when he was visiting his family in [place].  He does not have orthopnea, PND, lower extremity edema and has not  experienced any chest discomfort since his original myocardial infarction 5 years ago.  He denies palpitations, dizziness or syncope.  He does not have intermittent claudication. ? ?Risk factors are poorly controlled.  Most recent hemoglobin A1c is 8.3%.  LDL cholesterol is 148 (although he reports compliance with atorvastatin).  He quit smoking about 5 years ago and has a roughly 20-pack-year history of smoking before that. ? ? ?Past Medical History:  ?Diagnosis Date  ? Coronary artery disease   ? stent placed  ? Dilated cardiomyopathy ([place])   ? DM (diabetes mellitus) ([place]) 1996  ? DVT (deep venous thrombosis) ([place])   ? right leg   ? Erectile dysfunction   ? H/O acute myocardial infarction   ? H/O heart artery stent   ? History of DVT (deep vein thrombosis)   ? HTN (hypertension)   ? Ischemic cardiomyopathy   ? Myocardial infarction [facility])   ? Obesity   ? Peripheral vascular disease ([place])   ? Pure hypercholesterolemia   ? ? ?Past Surgical History:  ?Procedure Laterality Date  ? CORONARY STENT PLACEMENT    ? INCISION AND DRAINAGE PERIRECTAL ABSCESS Left 11/20/2017  ? Procedure: IRRIGATION AND DEBRIDEMENT PERIRECTAL ABSCESS;  Surgeon: Stark Klein, MD;  Location: [facility] ORS;  Service: General;  Laterality: Left;  ?  ? ?Home Medications:  ?Prior to Admission medications   ?Medication Sig Start Date End Date Taking? Authorizing Provider  ?aspirin EC 81 MG tablet Take 81 mg by mouth daily.   Yes [provider]  ?Insulin Isophane & Regular Human (NOVOLIN 70/30 FLEXPEN) (70-30) 100 UNIT/ML PEN Inject 35 Units into the skin 2 (two) times daily. ?Patient taking differently: Inject 28 Units  into the skin daily. 05/08/19  Yes Law, Bea Graff, PA-C  ?metoprolol tartrate (LOPRESSOR) 25 MG tablet TAKE 1 & 1/2 (ONE & ONE-HALF) TABLETS BY MOUTH TWICE DAILY ?Patient taking differently: Take 37.5 mg by mouth 2 (two) times daily. 05/08/19  Yes Law, Bea Graff, PA-C  ?Ticagrelor (BRILINTA PO) Take 1 tablet by mouth daily.   Yes  [provider]  ?atorvastatin (LIPITOR) 80 MG tablet Take 1 tablet (80 mg total) by mouth daily. ?Patient not taking: Reported on 12/03/2021 05/08/19   Frederica Kuster, PA-C  ?glipiZIDE (GLUCOTROL XL) 10 MG 24 hr tablet Take 10 mg by mouth daily. ?Patient not taking: Reported on 12/03/2021    [provider]  ?lisinopril (ZESTRIL) 5 MG tablet Take 1 tablet (5 mg total) by mouth daily. ?Patient not taking: Reported on 12/03/2021 05/08/19   Frederica Kuster, PA-C  ?tadalafil (CIALIS) 10 MG tablet 1/2-1 tablet prn ?Patient not taking: Reported on 10/10/2018 12/28/17   Carlena Hurl, PA-C  ? ? ?Inpatient Medications: ?Scheduled Meds: ? aspirin  81 mg Oral Daily  ? feeding supplement  237 mL Oral BID BM  ? insulin aspart  0-15 Units Subcutaneous TID WC  ? insulin aspart  0-5 Units Subcutaneous QHS  ? insulin glargine-yfgn  35 Units Subcutaneous QHS  ? losartan  12.5 mg Oral Daily  ? metoprolol succinate  12.5 mg Oral Daily  ? rosuvastatin  40 mg Oral Daily  ? ticagrelor  90 mg Oral BID  ? ?Continuous Infusions: ? ?PRN Meds: ?acetaminophen **OR** acetaminophen (TYLENOL) oral liquid 160 mg/5 mL **OR** acetaminophen ? ?Allergies:   No Known Allergies ? ?Social History:   ?Social History  ? ?Socioeconomic History  ? Marital status: Legally Separated  ?  Spouse name: Not on file  ? Number of children: Not on file  ? Years of education: Not on file  ? Highest education level: Not on file  ?Occupational History  ? Not on file  ?Tobacco Use  ? Smoking status: Former  ?  Years: 20.00  ?  Types: Cigarettes  ? Smokeless tobacco: Never  ?Vaping Use  ? Vaping Use: Never used  ?Substance and Sexual Activity  ? Alcohol use: Yes  ?  Comment: socially - beer  ? Drug use: No  ? Sexual activity: Not on file  ?Other Topics Concern  ? Not on file  ?Social History Narrative  ? Not on file  ? ?Social Determinants of Health  ? ?Financial Resource Strain: Not on file  ?Food Insecurity: Not on file  ?Transportation Needs: Not on  file  ?Physical Activity: Not on file  ?Stress: Not on file  ?Social Connections: Not on file  ?Intimate Partner Violence: Not on file  ?  ?Family History:   ? ?Family History  ?Problem Relation Age of Onset  ? Diabetes Mother   ?     complications of diabetes  ? Heart disease Father   ?     died of MI, died age 61yo  ? Hypertension Father   ? Multiple sclerosis Sister   ? Cancer Brother   ?     brain tumor  ? Diabetes Brother   ? Lupus Sister   ? Lupus Sister   ? Stroke Neg Hx   ?  ? ?ROS:  ?Please see the history of present illness.  ? ?All other ROS reviewed and negative.    ? ?Physical Exam/Data:  ? ?Vitals:  ? 12/05/21 0023 12/05/21 0351 12/05/21 0800 12/05/21  1255  ?BP: 136/84 (!) 151/82 112/81 124/86  ?Pulse: 92 88 100 100  ?Resp: '18 16 18   '$ ?Temp: 99.2 ?F (37.3 ?C) 99.4 ?F (37.4 ?C) 98.7 ?F (37.1 ?C)   ?TempSrc: Oral Oral Oral   ?SpO2: 99% 99% 99%   ?Weight:      ?Height:      ? ? ?Intake/Output Summary (Last 24 hours) at 12/05/2021 1503 ?Last data filed at 12/04/2021 2138 ?Gross per 24 hour  ?Intake 560 ml  ?Output 600 ml  ?Net -40 ml  ? ?Last 3 Weights 12/03/2021 03/29/2020 02/04/2020  ?Weight (lbs) 155 lb 164 lb 160 lb  ?Weight (kg) 70.308 kg 74.39 kg 72.576 kg  ?   ?Body mass index is 23.57 kg/m?.  ?General:  Well nourished, well developed, in no acute distress ?HEENT: normal ?Neck: no JVD ?Vascular: No carotid bruits; Distal pulses 2+ bilaterally ?Cardiac:  normal S1, S2; RRR; no murmur  ?Lungs:  clear to auscultation bilaterally, no wheezing, rhonchi or rales  ?Abd: soft, nontender, no hepatomegaly  ?Ext: no edema ?Musculoskeletal:  No deformities, BUE and BLE strength normal and equal ?Skin: warm and dry  ?Neuro:  CNs 2-12 intact, no focal abnormalities noted ?Psych:  Normal affect  ? ?EKG:  The EKG was personally reviewed and demonstrates: Sinus rhythm, left ventricular hypertrophy with prominent secondary repolarization abnormalities, absent R waves V1-V3 ?Telemetry:  Telemetry was personally reviewed  and demonstrates: Sinus rhythm, no evidence of atrial fibrillation ? ?Relevant CV Studies: ? 1. Left ventricular ejection fraction, by estimation, is 20 to 25%. The  ?left ventricle has severely decreased func

## 2021-12-05 NOTE — H&P (View-Only) (Signed)
?Cardiology Consultation:  ? ?Patient ID: Tommy Jimenez ?MRN: [id number]; DOB: 1966/06/15 ? ?Admit date: 12/03/2021 ?Date of Consult: 12/05/2021 ? ?PCP:  [name], PA-C ?  ?[facility] Providers ?Cardiologist:  None      ? ? ?Patient Profile:  ? ?Tommy Jimenez is a 56 y.o. 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 [place] ? ?History of Present Illness:  ? ?Tommy Jimenez was admitted 12/03/2021 after experiencing at least 2 separate episodes of neurological deficit, right leg loss of sensation and weakness during the first event, weakness in both upper and lower right extremities the second time.  These events occurred while Jimenez was taking aspirin and Brilinta, prescribed for coronary disease many years earlier.  His neurological complaints have resolved completely. ? ?Studies show multiple small embolic strokes, but these do not necessarily fit well with the patient's neurological complaints.  There is a strong suspicion for cardioembolic source of stroke. ? ?In 2018 Jimenez had proximal 80 artery stenosis causing acute myocardial infarction complicated by cardiac arrest resulting in moderate to severe ischemic cardiomyopathy with EF 30-35%, but without any symptoms of heart failure.  Follow-up with cardiology has been spotty over the years due to social limitations.  Jimenez last saw cardiologist in 2020.  Jimenez has been receiving ACE inhibitor's and beta-blockers inconsistently. ? ?Over the last several months Jimenez has noticed exertional dyspnea, NYHA functional class II (walking roughly 1 block).  This was particularly noticeable when Jimenez was visiting his family in [place].  Jimenez does not have orthopnea, PND, lower extremity edema and has not  experienced any chest discomfort since his original myocardial infarction 5 years ago.  Jimenez denies palpitations, dizziness or syncope.  Jimenez does not have intermittent claudication. ? ?Risk factors are poorly controlled.  Most recent hemoglobin A1c is 8.3%.  LDL cholesterol is 148 (although Jimenez reports compliance with atorvastatin).  Jimenez quit smoking about 5 years ago and has a roughly 20-pack-year history of smoking before that. ? ? ?Past Medical History:  ?Diagnosis Date  ? Coronary artery disease   ? stent placed  ? Dilated cardiomyopathy ([place])   ? DM (diabetes mellitus) ([place]) 1996  ? DVT (deep venous thrombosis) ([place])   ? right leg   ? Erectile dysfunction   ? H/O acute myocardial infarction   ? H/O heart artery stent   ? History of DVT (deep vein thrombosis)   ? HTN (hypertension)   ? Ischemic cardiomyopathy   ? Myocardial infarction [facility])   ? Obesity   ? Peripheral vascular disease ([place])   ? Pure hypercholesterolemia   ? ? ?Past Surgical History:  ?Procedure Laterality Date  ? CORONARY STENT PLACEMENT    ? INCISION AND DRAINAGE PERIRECTAL ABSCESS Left 11/20/2017  ? Procedure: IRRIGATION AND DEBRIDEMENT PERIRECTAL ABSCESS;  Surgeon: Stark Klein, MD;  Location: [facility];  Service: General;  Laterality: Left;  ?  ? ?Home Medications:  ?Prior to Admission medications   ?Medication Sig Start Date End Date Taking? Authorizing Provider  ?aspirin EC 81 MG tablet Take 81 mg by mouth daily.   Yes [provider]  ?Insulin Isophane & Regular Human (NOVOLIN 70/30 FLEXPEN) (70-30) 100 UNIT/ML PEN Inject 35 Units into the skin 2 (two) times daily. ?Patient taking differently: Inject 28 Units  into the skin daily. 05/08/19  Yes Law, Bea Graff, PA-C  ?metoprolol tartrate (LOPRESSOR) 25 MG tablet TAKE 1 & 1/2 (ONE & ONE-HALF) TABLETS BY MOUTH TWICE DAILY ?Patient taking differently: Take 37.5 mg by mouth 2 (two) times daily. 05/08/19  Yes Law, Bea Graff, PA-C  ?Ticagrelor (BRILINTA PO) Take 1 tablet by mouth daily.   Yes  [provider]  ?atorvastatin (LIPITOR) 80 MG tablet Take 1 tablet (80 mg total) by mouth daily. ?Patient not taking: Reported on 12/03/2021 05/08/19   Frederica Kuster, PA-C  ?glipiZIDE (GLUCOTROL XL) 10 MG 24 hr tablet Take 10 mg by mouth daily. ?Patient not taking: Reported on 12/03/2021    [provider]  ?lisinopril (ZESTRIL) 5 MG tablet Take 1 tablet (5 mg total) by mouth daily. ?Patient not taking: Reported on 12/03/2021 05/08/19   Frederica Kuster, PA-C  ?tadalafil (CIALIS) 10 MG tablet 1/2-1 tablet prn ?Patient not taking: Reported on 10/10/2018 12/28/17   Carlena Hurl, PA-C  ? ? ?Inpatient Medications: ?Scheduled Meds: ? aspirin  81 mg Oral Daily  ? feeding supplement  237 mL Oral BID BM  ? insulin aspart  0-15 Units Subcutaneous TID WC  ? insulin aspart  0-5 Units Subcutaneous QHS  ? insulin glargine-yfgn  35 Units Subcutaneous QHS  ? losartan  12.5 mg Oral Daily  ? metoprolol succinate  12.5 mg Oral Daily  ? rosuvastatin  40 mg Oral Daily  ? ticagrelor  90 mg Oral BID  ? ?Continuous Infusions: ? ?PRN Meds: ?acetaminophen **OR** acetaminophen (TYLENOL) oral liquid 160 mg/5 mL **OR** acetaminophen ? ?Allergies:   No Known Allergies ? ?Social History:   ?Social History  ? ?Socioeconomic History  ? Marital status: Legally Separated  ?  Spouse name: Not on file  ? Number of children: Not on file  ? Years of education: Not on file  ? Highest education level: Not on file  ?Occupational History  ? Not on file  ?Tobacco Use  ? Smoking status: Former  ?  Years: 20.00  ?  Types: Cigarettes  ? Smokeless tobacco: Never  ?Vaping Use  ? Vaping Use: Never used  ?Substance and Sexual Activity  ? Alcohol use: Yes  ?  Comment: socially - beer  ? Drug use: No  ? Sexual activity: Not on file  ?Other Topics Concern  ? Not on file  ?Social History Narrative  ? Not on file  ? ?Social Determinants of Health  ? ?Financial Resource Strain: Not on file  ?Food Insecurity: Not on file  ?Transportation Needs: Not on  file  ?Physical Activity: Not on file  ?Stress: Not on file  ?Social Connections: Not on file  ?Intimate Partner Violence: Not on file  ?  ?Family History:   ? ?Family History  ?Problem Relation Age of Onset  ? Diabetes Mother   ?     complications of diabetes  ? Heart disease Father   ?     died of MI, died age 62yo  ? Hypertension Father   ? Multiple sclerosis Sister   ? Cancer Brother   ?     brain tumor  ? Diabetes Brother   ? Lupus Sister   ? Lupus Sister   ? Stroke Neg Hx   ?  ? ?ROS:  ?Please see the history of present illness.  ? ?All other ROS reviewed and negative.    ? ?Physical Exam/Data:  ? ?Vitals:  ? 12/05/21 0023 12/05/21 0351 12/05/21 0800 12/05/21  1255  ?BP: 136/84 (!) 151/82 112/81 124/86  ?Pulse: 92 88 100 100  ?Resp: '18 16 18   '$ ?Temp: 99.2 ?F (37.3 ?C) 99.4 ?F (37.4 ?C) 98.7 ?F (37.1 ?C)   ?TempSrc: Oral Oral Oral   ?SpO2: 99% 99% 99%   ?Weight:      ?Height:      ? ? ?Intake/Output Summary (Last 24 hours) at 12/05/2021 1503 ?Last data filed at 12/04/2021 2138 ?Gross per 24 hour  ?Intake 560 ml  ?Output 600 ml  ?Net -40 ml  ? ?Last 3 Weights 12/03/2021 03/29/2020 02/04/2020  ?Weight (lbs) 155 lb 164 lb 160 lb  ?Weight (kg) 70.308 kg 74.39 kg 72.576 kg  ?   ?Body mass index is 23.57 kg/m?.  ?General:  Well nourished, well developed, in no acute distress ?HEENT: normal ?Neck: no JVD ?Vascular: No carotid bruits; Distal pulses 2+ bilaterally ?Cardiac:  normal S1, S2; RRR; no murmur  ?Lungs:  clear to auscultation bilaterally, no wheezing, rhonchi or rales  ?Abd: soft, nontender, no hepatomegaly  ?Ext: no edema ?Musculoskeletal:  No deformities, BUE and BLE strength normal and equal ?Skin: warm and dry  ?Neuro:  CNs 2-12 intact, no focal abnormalities noted ?Psych:  Normal affect  ? ?EKG:  The EKG was personally reviewed and demonstrates: Sinus rhythm, left ventricular hypertrophy with prominent secondary repolarization abnormalities, absent R waves V1-V3 ?Telemetry:  Telemetry was personally reviewed  and demonstrates: Sinus rhythm, no evidence of atrial fibrillation ? ?Relevant CV Studies: ? 1. Left ventricular ejection fraction, by estimation, is 20 to 25%. The  ?left ventricle has severely decreased func

## 2021-12-05 NOTE — Assessment & Plan Note (Addendum)
Previous TTE in care everywhere 05/31/2017 with LVEF 30-35%.  History of cardiac arrest with STEMI and LAD occlusion s/p PCI/stent 2018 at [facility].  Has been lost to follow-up.  Repeat TTE 3/18 with LVEF 20-25%, LV severely decreased function, LV global hypokinesis, LV mildly dilated, mild LVH, LA moderately dilated.  Patient is euvolemic on exam.  Started on metoprolol succinate 12.5 mg p.o. daily, losartan 12.5 mg p.o. daily.  On Plavix, Crestor, Eliquis. If EF remains <35% after heart failure treatment, may benefit from ICD for primary prevention.  ?

## 2021-12-05 NOTE — Progress Notes (Signed)
?PROGRESS NOTE ? ? ? ?Tommy Jimenez  TKZ:[id number] DOB: April 15, 1966 DOA: 12/03/2021 ?PCP: [name], PA-C  ? ? ?Brief Narrative:  ?Tommy Jimenez is a 56 year old male with past medical history significant for type 2 diabetes mellitus, cardiomyopathy, hyperlipidemia, CAD on aspirin and Brilinta who initially presented to [facility] ED on 3/17 with 2 episodes of right-sided numbness and weakness.  After leaving work the day prior, patient noted that his right leg suddenly went numb and weak; lasting approximately 10 minutes.  Today he reported recurrent right leg weakness as well as right hand weakness in which he sought care in the emergency department.  Also reported some dizziness that has resolved. ? ?In the ED, temperature 97.5 ?F, HR 100, RR 16, BP 153/90, SPO2 100% on room air.  Sodium 132, potassium 4.4, chloride 101, CO2 24, glucose 300, BUN 18, creatinine 0.87, glucose 300.  LFTs within normal limits.  WBC 8.9, hemoglobin 12.6, platelets 322.  UDS negative.  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  MR brain without contrast with small focus of acute ischemia in the right frontal cortex, no hemorrhage/mass effect, multiple old cerebellar infarcts and findings of small vessel ischemia that is chronic. MRA head/neck with no emergent large vessel occlusion or high-grade stenosis, moderate stenosis of the mid V2 segment of the left vertebral artery.  Neurology was consulted.  Patient was transferred to [facility] for stroke team evaluation and further work-up under hospitalist service. ?  ? ?  ?Assessment and Plan: ?CVA (cerebral vascular accident) ([place]) ?Patient presenting to ED with recurrent right lower extremity followed by right upper/lower extremity weakness and numbness with associated dizziness.  MRI brain with small focus of acute ischemia right frontal cortex.  MRA head/neck with no emergent large vessel occlusion or high-grade  stenosis.  Hemoglobin A1c 8.3, LDL 148.  Seen by PT/OT/SLP with no needs identified. ?--Neurology following, appreciate assistance ?--TTE: LVEF 20-25% ?--Aspirin 81 mg p.o. daily ?--Brilinta '90mg'$  BID ?--Crestor 40 mg p.o. daily ?--Monitor on telemetry ?--Plan for TEE and likely loop recorder, per neurology ? ? ?Diabetes mellitus ([place]) ?Hemoglobin A1c 8.3, poorly controlled.  Currently taking insulin 70/30 28 units [place] daily at home. ?--Semglee 35 units [place] daily ?--moderate SSI for coverge ?--CBGs qAC/HS ? ?Hyperlipidemia ?Lipid panel with total cholesterol 219, HDL 53, LDL 148, triglycerides 88.  Was previously on atorvastatin 80 mg p.o. daily, but stopped it due to muscle cramps causing him difficulty with standing. ?--Started on Crestor 40 mg p.o. daily ? ?Essential hypertension, benign ?Home regimen includes metoprolol tartrate 37.5 mg p.o. twice daily. ?--Holding antihypertensives to allow permissive hypertension in the setting of acute CVA ? ?Chronic systolic CHF (congestive heart failure) ([place]) ?Previous TTE in care everywhere 05/31/2017 with LVEF 30-35%.  History of cardiac arrest with STEMI and LAD occlusion s/p PCI/stent 2018 at [facility].  Has been lost to follow-up.  Repeat TTE 3/18 with LVEF 20-25%, LV severely decreased function, LV global hypokinesis, LV mildly dilated, mild LVH, LA moderately dilated.  Patient is euvolemic on exam. ?--Start metoprolol succinate 12.5 mg p.o. daily ?--Start losartan 12.5 mg p.o. daily ?--Strict I's and O's and daily weights ? ?CAD S/P percutaneous coronary angioplasty ?History of STEMI and brief cardiac arrest 2018; went emergently to the Cath Lab with notable acute occlusion ostial LAD s/p PCI/stent placement at [facility]. ?--Continue aspirin and Brilinta ? ?Hyponatremia ?Sodium 132, mild.  Encourage increase oral  intake ? ?Hypocalcemia ?Calcium 8.6, slightly low.  Outpatient follow-up PCP. ? ?Normocytic anemia ?Hemoglobin 12.6,  stable. ? ?Tobacco abuse ?Counseled on need for complete cessation. ? ? ? ? ? ?DVT prophylaxis: SCD's Start: 12/03/21 1838 ? ?  Code Status: Full Code ?Family Communication: No family present at bedside this morning ? ?Disposition Plan:  ?Level of care: Telemetry Medical ?Status is: Inpatient ?Remains inpatient appropriate because: Likely will need TEE and loop recorder per neurology ?  ? ?Consultants:  ?Neurology ? ?Procedures:  ?TTE ? ?Antimicrobials:  ?None ? ? ?Subjective: ?Patient seen examined bedside, resting comfortably.  No complaints or concerns this morning.  Likely to need TEE and loop recorder per neurology.  Denies headache, no dizziness, no chest pain, no palpitations, no shortness of breath, no abdominal pain, no fever/chills/night sweats, no nausea/vomiting/diarrhea, no current weakness, no fatigue, no paresthesia.  No acute events overnight per nursing staff. ? ?Objective: ?Vitals:  ? 12/04/21 1509 12/04/21 1930 12/05/21 0023 12/05/21 0351  ?BP: 134/79 (!) 175/89 136/84 (!) 151/82  ?Pulse: 93 98 92 88  ?Resp: '16 15 18 16  '$ ?Temp: 98.5 ?F (36.9 ?C) 98.3 ?F (36.8 ?C) 99.2 ?F (37.3 ?C) 99.4 ?F (37.4 ?C)  ?TempSrc: Oral Oral Oral Oral  ?SpO2: 100% 96% 99% 99%  ?Weight:      ?Height:      ? ? ?Intake/Output Summary (Last 24 hours) at 12/05/2021 1158 ?Last data filed at 12/04/2021 2138 ?Gross per 24 hour  ?Intake 880 ml  ?Output 1000 ml  ?Net -120 ml  ? ?Filed Weights  ? 12/03/21 1208  ?Weight: 70.3 kg  ? ? ?Examination: ? ?Physical Exam: ?GEN: NAD, alert and oriented x 3, wd/wn ?HEENT: NCAT, PERRL, EOMI, sclera clear, MMM ?PULM: CTAB w/o wheezes/crackles, normal respiratory effort, on room air ?CV: RRR w/o M/G/R ?GI: abd soft, NTND, NABS, no R/G/M ?MSK: no peripheral edema, muscle strength globally intact 5/5 bilateral upper/lower extremities ?NEURO: CN II-XII intact, no focal deficits, sensation to light touch intact ?PSYCH: normal mood/affect ?Integumentary: dry/intact, no rashes or wounds ? ? ? ?Data  Reviewed: I have personally reviewed following labs and imaging studies ? ?CBC: ?Recent Labs  ?Lab 12/03/21 ?1241  ?WBC 8.9  ?NEUTROABS 4.8  ?HGB 12.6*  ?HCT 37.9*  ?MCV 85.9  ?PLT 322  ? ?Basic Metabolic Panel: ?Recent Labs  ?Lab 12/03/21 ?1241  ?NA 132*  ?K 4.4  ?CL 101  ?CO2 24  ?GLUCOSE 300*  ?BUN 18  ?CREATININE 0.87  ?CALCIUM 8.6*  ?MG 2.1  ? ?GFR: ?Estimated Creatinine Clearance: 93.9 mL/min (by C-G formula based on SCr of 0.87 mg/dL). ?Liver Function Tests: ?Recent Labs  ?Lab 12/03/21 ?1241  ?AST 15  ?ALT 12  ?ALKPHOS 72  ?BILITOT 0.5  ?PROT 7.5  ?ALBUMIN 4.0  ? ?No results for input(s): LIPASE, AMYLASE in the last 168 hours. ?No results for input(s): AMMONIA in the last 168 hours. ?Coagulation Profile: ?No results for input(s): INR, PROTIME in the last 168 hours. ?Cardiac Enzymes: ?No results for input(s): CKTOTAL, CKMB, CKMBINDEX, TROPONINI in the last 168 hours. ?BNP (last 3 results) ?No results for input(s): PROBNP in the last 8760 hours. ?HbA1C: ?Recent Labs  ?  12/04/21 ?0106  ?HGBA1C 8.3*  ? ?CBG: ?Recent Labs  ?Lab 12/04/21 ?1616 12/04/21 ?2107 12/05/21 ?3382 12/05/21 ?5053 12/05/21 ?1156  ?GLUCAP 153* 236* 228* 213* 175*  ? ?Lipid Profile: ?Recent Labs  ?  12/04/21 ?0106  ?CHOL 219*  ?HDL 53  ?LDLCALC 148*  ?TRIG 88  ?CHOLHDL  4.1  ? ?Thyroid Function Tests: ?No results for input(s): TSH, T4TOTAL, FREET4, T3FREE, THYROIDAB in the last 72 hours. ?Anemia Panel: ?Recent Labs  ?  12/04/21 ?0106  ?TIBC 297  ?IRON 67  ? ?Sepsis Labs: ?No results for input(s): PROCALCITON, LATICACIDVEN in the last 168 hours. ? ?No results found for this or any previous visit (from the past 240 hour(s)).  ? ? ? ? ? ?Radiology Studies: ?CT ANGIO HEAD NECK W WO CM ? 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 EXAM: CT ANGIOGRAPHY HEAD AND NECK TECHNIQUE: Multidetector CT  imaging of the head and neck was performed using the standard protocol during bolus administration of intravenous contrast. Multiplanar CT image reconstructions and MIPs were obtained to evaluate the vascular

## 2021-12-05 NOTE — Progress Notes (Signed)
STROKE TEAM PROGRESS NOTE  ? ?SUBJECTIVE (INTERVAL HISTORY) ?RN is at the bedside.  No acute event overnight.  Patient no complaints.  Neuro stable.  2D echo showed EF 20 to 25%, worsened from 2018.  Likely the cause of his stroke.  Requested cardiology consultation. ? ? ?OBJECTIVE ?Temp:  [98.3 ?F (36.8 ?C)-99.4 ?F (37.4 ?C)] 98.7 ?F (37.1 ?C) (03/19 0800) ?Pulse Rate:  [88-100] 100 (03/19 1255) ?Cardiac Rhythm: Normal sinus rhythm (03/19 0700) ?Resp:  [15-18] 18 (03/19 0800) ?BP: (112-175)/(81-89) 124/86 (03/19 1255) ?SpO2:  [96 %-99 %] 99 % (03/19 0800) ? ?Recent Labs  ?Lab 12/04/21 ?1616 12/04/21 ?2107 12/05/21 ?3016 12/05/21 ?0109 12/05/21 ?1156  ?GLUCAP 153* 236* 228* 213* 175*  ? ?Recent Labs  ?Lab 12/03/21 ?1241  ?NA 132*  ?K 4.4  ?CL 101  ?CO2 24  ?GLUCOSE 300*  ?BUN 18  ?CREATININE 0.87  ?CALCIUM 8.6*  ?MG 2.1  ? ?Recent Labs  ?Lab 12/03/21 ?1241  ?AST 15  ?ALT 12  ?ALKPHOS 72  ?BILITOT 0.5  ?PROT 7.5  ?ALBUMIN 4.0  ? ?Recent Labs  ?Lab 12/03/21 ?1241  ?WBC 8.9  ?NEUTROABS 4.8  ?HGB 12.6*  ?HCT 37.9*  ?MCV 85.9  ?PLT 322  ? ?No results for input(s): CKTOTAL, CKMB, CKMBINDEX, TROPONINI in the last 168 hours. ?No results for input(s): LABPROT, INR in the last 72 hours. ?No results for input(s): COLORURINE, LABSPEC, [place], GLUCOSEU, HGBUR, BILIRUBINUR, KETONESUR, PROTEINUR, UROBILINOGEN, NITRITE, LEUKOCYTESUR in the last 72 hours. ? ?Invalid input(s): APPERANCEUR  ?   ?Component Value Date/Time  ? CHOL 219 (H) 12/04/2021 0106  ? CHOL 184 05/10/2018 1342  ? TRIG 88 12/04/2021 0106  ? HDL 53 12/04/2021 0106  ? HDL 54 05/10/2018 1342  ? CHOLHDL 4.1 12/04/2021 0106  ? VLDL 18 12/04/2021 0106  ? [place] 148 (H) 12/04/2021 0106  ? LDLCALC 115 (H) 05/10/2018 1342  ? ?Lab Results  ?Component Value Date  ? HGBA1C 8.3 (H) 12/04/2021  ? ?   ?Component Value Date/Time  ? [place] DETECTED 12/03/2021 1838  ? [place] DETECTED 12/03/2021 1838  ? LABBENZ NONE DETECTED 12/03/2021 1838  ? AMPHETMU NONE DETECTED  12/03/2021 1838  ? [place] DETECTED 12/03/2021 1838  ? LABBARB NONE DETECTED 12/03/2021 1838  ?  ?No results for input(s): ETH in the last 168 hours. ? ?I have personally reviewed the radiological images below and agree with the radiology interpretations. ? ?CT ANGIO HEAD NECK W WO CM ? 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 EXAM: CT ANGIOGRAPHY HEAD AND NECK TECHNIQUE: Multidetector CT imaging of the head and neck was performed using the standard protocol during bolus administration of intravenous contrast. Multiplanar CT image reconstructions and MIPs were obtained to evaluate the vascular anatomy. Carotid stenosis measurements (when applicable) are obtained utilizing NASCET criteria, using the distal internal carotid diameter as the denominator. RADIATION DOSE REDUCTION: This exam was performed according to the departmental dose-optimization program which includes automated exposure control, adjustment of the mA and/or kV according to patient size and/or use of iterative reconstruction technique. CONTRAST:  67m OMNIPAQUE IOHEXOL 350 MG/ML SOLN COMPARISON:  Brain MRI, head and neck MRA yesterday. Head CT yesterday. FINDINGS: CT HEAD Brain: Small right anterior frontal lobe cortical infarct remains occult by CT. No acute intracranial hemorrhage identified. No midline shift, mass effect, or evidence of intracranial mass lesion. No ventriculomegaly.  Chronic left greater than right cerebellar infarcts. Calvarium and skull base: No acute osseous abnormality identified. Paranasal sinuses: Visualized paranasal sinuses and mastoids are stable and well aerated. Orbits: Visualized orbits and scalp soft tissues are within normal limits. CTA NECK Skeleton: Scattered dental caries and periapical dental lucency. Mild for age cervical spine degeneration. No acute  osseous abnormality identified. Upper chest: Negative. Other neck: Negative. Aortic arch: 3 vessel arch configuration. Mild arch atherosclerosis. Right carotid system: Soft and calcified brachiocephalic artery plaque without stenosis. Negative right CCA origin. Mild plaque at the right carotid bifurcation. No stenosis. Left carotid system: Mild plaque at the left ICA origin and bulb without stenosis. Vertebral arteries: Normal proximal right subclavian artery. Highly diminutive right vertebral artery throughout the neck, with the corresponding small bony right cervical transverse foramen. Mild proximal left subclavian artery plaque without stenosis. Normal left vertebral artery origin. Dominant although diminutive and intermittently irregular left vertebral artery is patent to the skull base with no hemodynamically significant stenosis. CTA HEAD Posterior circulation: Dominant left vertebral artery supplies the basilar with mild to moderate irregularity of the left V4 segment (series 11, image 137). No significant stenosis. Non dominant highly diminutive right vertebral artery functionally terminates at the skull base or in PICA. Patent although diminutive basilar artery with mild atherosclerotic irregularity. No high-grade basilar stenosis. Patent PCA origins. SCA is are diminutive. Bilateral PCAs are also diminutive but remain patent. There is moderate long segment stenosis of the left PCA P1 and P2 Anterior circulation: Both ICA siphons are atherosclerotic and irregular but remain patent. On the left there is mild to moderate siphon stenosis. On the right there is moderate to severe siphon stenosis in the cavernous segment (series 13, image 86) and in the supraclinoid segment (image 89). Both carotid termini are patent. Patent MCA and ACA origins. Azygous appearing ACA anatomy. No definite ACA branch occlusion. Both MCA M1 segments and bifurcations are patent without stenosis. No MCA branch occlusion identified.  Bilateral M3 branches are mildly irregular. Venous sinuses: Patent. Anatomic variants: Dominant left and congenitally diminutive right vertebral arteries. 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 Review of the MIP images confirms the above findings IMPRESSION: 1. Negative for large vessel occlusion. 2. Relatively mild extracranial but moderate to severe Intracranial Atherosclerosis: - mild to moderate stenosis of the Distal Left Vertebral Artery which supplies the Basilar (congenital highly diminutive right vertebral.). - up to moderate stenosis Left PCA P1/P2. - up to moderate stenosis diminutive and irregular Left ICA siphon. - tandem moderate to severe stenoses of the diminutive and irregular Right ICA siphon. 3. Small right anterior frontal lobe cortical infarct remains occult by CT. Chronic cerebellar infarcts. No new intracranial abnormality. Electronically Signed   By: Genevie Ann M.D.   On: 12/04/2021 09:13  ? ?CT Head Wo Contrast ? ?Result Date: 12/03/2021 ?CLINICAL DATA:  TIA, numbness EXAM: CT HEAD WITHOUT CONTRAST TECHNIQUE: Contiguous axial images were obtained from the base of the skull through the vertex without intravenous contrast. RADIATION DOSE REDUCTION: This exam was performed according to the departmental dose-optimization program which includes automated exposure control, adjustment of the mA and/or kV according to patient size and/or use of iterative reconstruction technique. COMPARISON:  MRI head 08/02/2013 FINDINGS: Brain: No acute intracranial hemorrhage, mass effect, or herniation. No extra-axial fluid collections. No evidence of acute territorial infarct. No hydrocephalus. Multiple foci of encephalomalacia in the bilateral cerebellum left greater than right, consistent with old infarcts. Vascular: No hyperdense vessel or unexpected calcification. Skull: Normal. Negative for  fracture or focal lesion. Sinuses/Orbits: No acute finding. Other: None. IMPRESSION: 1. No acute intracranial process  identified. 2. 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 Electronically Signed   By: Ofilia Neas M.D.   On: 12/03/2021 13:30  ? ?MR ANGIO HEAD WO CONTRAST ? ?Result Date: 12/03/2021 ?CLINICAL DATA:  Transient i

## 2021-12-06 ENCOUNTER — Other Ambulatory Visit (HOSPITAL_COMMUNITY): Payer: Self-pay

## 2021-12-06 ENCOUNTER — Other Ambulatory Visit: Payer: Self-pay | Admitting: Physician Assistant

## 2021-12-06 ENCOUNTER — Encounter (HOSPITAL_COMMUNITY): Payer: Self-pay | Admitting: Internal Medicine

## 2021-12-06 ENCOUNTER — Other Ambulatory Visit: Payer: Self-pay | Admitting: Neurology

## 2021-12-06 ENCOUNTER — Inpatient Hospital Stay (HOSPITAL_COMMUNITY): Payer: Self-pay | Admitting: Anesthesiology

## 2021-12-06 ENCOUNTER — Encounter (HOSPITAL_COMMUNITY)
Admission: EM | Disposition: A | Discharge status: Home / Self Care | Payer: Self-pay | Source: Home / Self Care | Attending: Internal Medicine

## 2021-12-06 ENCOUNTER — Inpatient Hospital Stay (HOSPITAL_COMMUNITY): Payer: Self-pay

## 2021-12-06 DIAGNOSIS — I639 Cerebral infarction, unspecified: Secondary | ICD-10-CM

## 2021-12-06 DIAGNOSIS — I2510 Atherosclerotic heart disease of native coronary artery without angina pectoris: Secondary | ICD-10-CM

## 2021-12-06 DIAGNOSIS — Q2112 Patent foramen ovale: Secondary | ICD-10-CM

## 2021-12-06 DIAGNOSIS — K047 Periapical abscess without sinus: Secondary | ICD-10-CM

## 2021-12-06 DIAGNOSIS — I110 Hypertensive heart disease with heart failure: Secondary | ICD-10-CM

## 2021-12-06 DIAGNOSIS — I5020 Unspecified systolic (congestive) heart failure: Secondary | ICD-10-CM

## 2021-12-06 HISTORY — PX: TEE WITHOUT CARDIOVERSION: SHX5443

## 2021-12-06 HISTORY — PX: BUBBLE STUDY: SHX6837

## 2021-12-06 LAB — BASIC METABOLIC PANEL
Anion gap: 8 (ref 5–15)
BUN: 21 mg/dL — ABNORMAL HIGH (ref 6–20)
CO2: 23 mmol/L (ref 22–32)
Calcium: 8.9 mg/dL (ref 8.9–10.3)
Chloride: 103 mmol/L (ref 98–111)
Creatinine, Ser: 0.92 mg/dL (ref 0.61–1.24)
GFR, Estimated: >60 mL/min (ref >60)
Glucose, Bld: 183 mg/dL — ABNORMAL HIGH (ref 70–99)
Potassium: 4 mmol/L (ref 3.5–5.1)
Sodium: 134 mmol/L — ABNORMAL LOW (ref 135–145)

## 2021-12-06 LAB — GLUCOSE, CAPILLARY
Glucose-Capillary: 184 mg/dL — ABNORMAL HIGH (ref 70–99)
Glucose-Capillary: 142 mg/dL — ABNORMAL HIGH (ref 70–99)
Glucose-Capillary: 90 mg/dL (ref 70–99)
Glucose-Capillary: 113 mg/dL — ABNORMAL HIGH (ref 70–99)

## 2021-12-06 LAB — MAGNESIUM: Magnesium: 2.1 mg/dL (ref 1.7–2.4)

## 2021-12-06 LAB — ECHO TEE
AV Mean grad: 3 mmHg
AV Peak grad: 6.5 mmHg
Ao pk vel: 1.27 m/s

## 2021-12-06 LAB — TROPONIN I (HIGH SENSITIVITY)
Troponin I (High Sensitivity): 22 ng/L — ABNORMAL HIGH (ref <18)
Troponin I (High Sensitivity): 22 ng/L — ABNORMAL HIGH (ref <18)

## 2021-12-06 SURGERY — ECHOCARDIOGRAM, TRANSESOPHAGEAL
Anesthesia: Monitor Anesthesia Care

## 2021-12-06 MED ORDER — SODIUM CHLORIDE 0.9 % IV SOLN: INTRAVENOUS | Status: DC

## 2021-12-06 MED ORDER — AMOXICILLIN-POT CLAVULANATE 875-125 MG PO TABS
1.0000 | ORAL_TABLET | Freq: Two times a day (BID) | ORAL | 0 refills | Status: AC
  Filled 2021-12-06: qty 14, 7d supply, fill #0

## 2021-12-06 MED ORDER — EPHEDRINE SULFATE-NACL 50-0.9 MG/10ML-% IV SOSY
PREFILLED_SYRINGE | INTRAVENOUS | Status: DC | PRN
  Administered 2021-12-06 (×2): 10 mg via INTRAVENOUS
  Administered 2021-12-06: 15 mg via INTRAVENOUS

## 2021-12-06 MED ORDER — LIDOCAINE 2% (20 MG/ML) 5 ML SYRINGE
INTRAMUSCULAR | Status: DC | PRN
  Administered 2021-12-06: 100 mg via INTRAVENOUS

## 2021-12-06 MED ORDER — PROPOFOL 10 MG/ML IV BOLUS
INTRAVENOUS | Status: DC | PRN
  Administered 2021-12-06: 30 mg via INTRAVENOUS

## 2021-12-06 MED ORDER — PHENYLEPHRINE 40 MCG/ML (10ML) SYRINGE FOR IV PUSH (FOR BLOOD PRESSURE SUPPORT)
PREFILLED_SYRINGE | INTRAVENOUS | Status: DC | PRN
  Administered 2021-12-06: 120 ug via INTRAVENOUS
  Administered 2021-12-06: 80 ug via INTRAVENOUS
  Administered 2021-12-06: 120 ug via INTRAVENOUS
  Administered 2021-12-06: 80 ug via INTRAVENOUS

## 2021-12-06 MED ORDER — ROSUVASTATIN CALCIUM 40 MG PO TABS
40.0000 mg | ORAL_TABLET | Freq: Every day | ORAL | 2 refills | Status: DC
  Filled 2021-12-06: qty 30, 30d supply, fill #0

## 2021-12-06 MED ORDER — PEN NEEDLES 3/16" 31G X 5 MM MISC
2 refills | Status: AC
Start: 2021-12-06 — End: ?

## 2021-12-06 MED ORDER — PROPOFOL 500 MG/50ML IV EMUL
INTRAVENOUS | Status: DC | PRN
Start: 2021-12-06 — End: 2021-12-06
  Administered 2021-12-06: 80 ug/kg/min via INTRAVENOUS

## 2021-12-06 MED ORDER — PHENYLEPHRINE HCL-NACL 20-0.9 MG/250ML-% IV SOLN
INTRAVENOUS | Status: DC | PRN
  Administered 2021-12-06: 80 ug/min via INTRAVENOUS

## 2021-12-06 MED ORDER — LOSARTAN POTASSIUM 25 MG PO TABS
12.5000 mg | ORAL_TABLET | Freq: Every day | ORAL | 2 refills | Status: DC
  Filled 2021-12-06: qty 15, 30d supply, fill #0

## 2021-12-06 MED ORDER — AMOXICILLIN-POT CLAVULANATE 875-125 MG PO TABS
1.0000 | ORAL_TABLET | Freq: Two times a day (BID) | ORAL | Status: DC
  Administered 2021-12-06: 1 via ORAL
  Filled 2021-12-06: qty 1

## 2021-12-06 MED ORDER — APIXABAN 5 MG PO TABS
5.0000 mg | ORAL_TABLET | Freq: Two times a day (BID) | ORAL | 2 refills | Status: AC
  Filled 2021-12-06: qty 60, 30d supply, fill #0

## 2021-12-06 MED ORDER — METOPROLOL SUCCINATE ER 25 MG PO TB24
12.5000 mg | ORAL_TABLET | Freq: Every day | ORAL | 2 refills | Status: DC
  Filled 2021-12-06: qty 15, 30d supply, fill #0

## 2021-12-06 MED ORDER — NOVOLIN 70/30 FLEXPEN RELION (70-30) 100 UNIT/ML ~~LOC~~ SUPN: 22.0000 [IU] | PEN_INJECTOR | Freq: Two times a day (BID) | SUBCUTANEOUS | 2 refills | Status: AC

## 2021-12-06 MED ORDER — CLOPIDOGREL BISULFATE 75 MG PO TABS
75.0000 mg | ORAL_TABLET | Freq: Every day | ORAL | 2 refills | Status: AC
  Filled 2021-12-06: qty 30, 30d supply, fill #0

## 2021-12-06 NOTE — Progress Notes (Addendum)
Msg sent to office monitor team to arrange 30day monitor for stroke. This will be mailed to patient's home. Pt aware of this and information on f/u appts. ?

## 2021-12-06 NOTE — TOC Initial Note (Signed)
Transition of Care (TOC) - Initial/Assessment Note  ? ? ?Patient Details  ?Name: Tommy Jimenez ?MRN: [id number] ?Date of Birth: [birth year] ? ?Transition of Care (TOC) CM/SW Contact:    ?Pollie Friar, RN ?Phone Number: ?12/06/2021, 11:17 AM ? ?Clinical Narrative:                 ?Patient is from home alone. He states he does have someone that can check in on him.  ?No DME.  ?Pt states he has insurance that will start April 1st. He will also have prescription care coverage at that time.  ?Patient has not been to [facility] in a year d/t the cost. He is interested in getting set up with one of the [facility]. CM was able to get an appointment with [facility] with information on the AVS.  ?Pt wants to stay with his pharmacy: [facility] [address] if possible. CM did include information for [facility].  ?Patient has been taking Brilinta at home under patient assistance but this runs out after his current bottle. He states he has enough Brilinta to last until his insurance starts and then it should be covered with affordable co pays.  ?Patient drives self to appointments.  ?TOC following. ? ?Expected Discharge Plan: Home/Self Care ?Barriers to Discharge: Continued Medical Work up ? ? ?Patient Goals and CMS Choice ?  ?  ?  ? ?Expected Discharge Plan and Services ?Expected Discharge Plan: Home/Self Care ?  ?Discharge Planning Services: CM Consult ?  ?Living arrangements for the past 2 months: [address] ?                ?  ?  ?  ?  ?  ?  ?  ?  ?  ?  ? ?Prior Living Arrangements/Services ?Living arrangements for the past 2 months: [place] ?Lives with:: Self ?Patient language and need for interpreter reviewed:: Yes ?Do you feel safe going back to the place where you live?: Yes      ?  ?  ?  ?Criminal Activity/Legal Involvement Pertinent to Current Situation/Hospitalization: No - Comment as needed ? ?Activities of Daily Living ?Home Assistive Devices/Equipment: None ?ADL Screening (condition at  time of admission) ?Patient's cognitive ability adequate to safely complete daily activities?: Yes ?Is the patient deaf or have difficulty hearing?: No ?Does the patient have difficulty seeing, even when wearing glasses/contacts?: No ?Does the patient have difficulty concentrating, remembering, or making decisions?: No ?Patient able to express need for assistance with ADLs?: Yes ?Does the patient have difficulty dressing or bathing?: No ?Independently performs ADLs?: Yes (appropriate for developmental age) ?Does the patient have difficulty walking or climbing stairs?: No ?Weakness of Legs: Right (intermittent) ?Weakness of Arms/Hands: None ? ?Permission Sought/Granted ?  ?  ?   ?   ?   ?   ? ?Emotional Assessment ?Appearance:: Appears stated age ?Attitude/Demeanor/Rapport: Engaged ?Affect (typically observed): Accepting ?Orientation: : Oriented to Self, Oriented to Place, Oriented to  Time, Oriented to Situation ?  ?Psych Involvement: No (comment) ? ?Admission diagnosis:  TIA (transient ischemic attack) [G45.9] ?Weakness [R53.1] ?Acute CVA (cerebrovascular accident) ([place]) [I63.9] ?Patient Active Problem List  ? Diagnosis Date Noted  ? Dental infection 12/06/2021  ? Chronic systolic CHF (congestive heart failure), NYHA class 2 ([place]) 12/05/2021  ? CVA (cerebral vascular accident) ([place]) 12/04/2021  ? CAD S/P percutaneous coronary angioplasty 12/03/2021  ? Hyponatremia 12/03/2021  ? Normocytic anemia 12/03/2021  ? Hypocalcemia 12/03/2021  ?  Heart disease 12/12/2017  ? Noncompliance 12/12/2017  ? Sepsis ([place]) 11/22/2017  ? Perirectal abscess 11/22/2017  ? Cellulitis and abscess of buttock 11/20/2017  ? S/P coronary artery stent placement   ? Ex-smoker 06/21/2017  ? Ischemic dilated cardiomyopathy ([place]) 06/21/2017  ? Long term (current) use of aspirin 06/21/2017  ? Tobacco abuse 05/31/2017  ? ST elevation myocardial infarction (STEMI), subsequent episode of care [facility]) 05/30/2017  ? Diabetes mellitus ([place]) 11/29/2013  ?  Hyperlipidemia 11/29/2013  ? Essential hypertension, benign 11/29/2013  ? Erectile dysfunction 11/29/2013  ? Type 2 diabetes mellitus, with long-term current use of insulin ([place]) 11/29/2013  ? ?PCP:  [name], PA-C ?Pharmacy:   ?[place], [place] - [address] ?[address] ?[place] [address] ?Phone: [phone] Fax: [phone] ? ?[place], [place] - [address] ?[address] ?[place] [address] ?Phone: [phone] Fax: [phone] ? ? ? ? ?Social Determinants of Health (SDOH) Interventions ?  ? ?Readmission Risk Interventions ?No flowsheet data found. ? ? ?

## 2021-12-06 NOTE — TOC Transition Note (Signed)
Transition of Care (TOC) - CM/SW Discharge Note ? ? ?Patient Details  ?Name: Tommy Jimenez ?MRN: [id number] ?Date of Birth: November 11, 1965 ? ?Transition of Care (TOC) CM/SW Contact:  ?Pollie Friar, RN ?Phone Number: ?12/06/2021, 4:14 PM ? ? ?Clinical Narrative:    ?Patient discharging home with self care. No needs per PT/OT.  ?Pt's medications for home to be delivered to the room per [place] and covered with [facility] program.  ?Pt provided $10 co pay card for Eliquis since his insurance starts April 1st.  ?Pt has transportation home.  ? ? ?Final next level of care: Home/Self Care ?Barriers to Discharge: Inadequate or no insurance, Barriers Unresolved (comment) ? ? ?Patient Goals and CMS Choice ?  ?  ?  ? ?Discharge Placement ?  ?           ?  ?  ?  ?  ? ?Discharge Plan and Services ?  ?Discharge Planning Services: CM Consult ?           ?  ?  ?  ?  ?  ?  ?  ?  ?  ?  ? ?Social Determinants of Health (SDOH) Interventions ?  ? ? ?Readmission Risk Interventions ?No flowsheet data found. ? ? ? ? ?

## 2021-12-06 NOTE — Progress Notes (Deleted)
Pt's son at bedside, provided pt list of medications; copy made and placed in pt chart.  ?

## 2021-12-06 NOTE — Progress Notes (Signed)
STROKE TEAM PROGRESS NOTE  ? ?SUBJECTIVE (INTERVAL HISTORY) ?RN at the bedside. Pt neuro intact. Had TEE today showed decreased EF but no LV thrombus. Cardiology on board, recommend to continue toprol and losartan, will do 30 day monitoring and agreed with eliquis but will add plavix.  ? ? ?OBJECTIVE ?Temp:  [97.4 ?F (36.3 ?C)-98.5 ?F (36.9 ?C)] 98.2 ?F (36.8 ?C) (03/20 1504) ?Pulse Rate:  [74-95] 95 (03/20 1504) ?Cardiac Rhythm: Normal sinus rhythm (03/20 1413) ?Resp:  [11-24] 16 (03/20 1504) ?BP: (77-127)/(50-83) 104/68 (03/20 1504) ?SpO2:  [97 %-100 %] 100 % (03/20 1504) ?Weight:  [70.3 kg] 70.3 kg (03/20 1241) ? ?Recent Labs  ?Lab 12/05/21 ?2115 12/06/21 ?0635 12/06/21 ?1129 12/06/21 ?1249 12/06/21 ?1343  ?GLUCAP 206* 184* 142* 90 113*  ? ?Recent Labs  ?Lab 12/03/21 ?1241 12/06/21 ?0250  ?NA 132* 134*  ?K 4.4 4.0  ?CL 101 103  ?CO2 24 23  ?GLUCOSE 300* 183*  ?BUN 18 21*  ?CREATININE 0.87 0.92  ?CALCIUM 8.6* 8.9  ?MG 2.1 2.1  ? ?Recent Labs  ?Lab 12/03/21 ?1241  ?AST 15  ?ALT 12  ?ALKPHOS 72  ?BILITOT 0.5  ?PROT 7.5  ?ALBUMIN 4.0  ? ?Recent Labs  ?Lab 12/03/21 ?1241  ?WBC 8.9  ?NEUTROABS 4.8  ?HGB 12.6*  ?HCT 37.9*  ?MCV 85.9  ?PLT 322  ? ?No results for input(s): CKTOTAL, CKMB, CKMBINDEX, TROPONINI in the last 168 hours. ?No results for input(s): LABPROT, INR in the last 72 hours. ?No results for input(s): COLORURINE, LABSPEC, [place], GLUCOSEU, HGBUR, BILIRUBINUR, KETONESUR, PROTEINUR, UROBILINOGEN, NITRITE, LEUKOCYTESUR in the last 72 hours. ? ?Invalid input(s): APPERANCEUR  ?   ?Component Value Date/Time  ? CHOL 219 (H) 12/04/2021 0106  ? CHOL 184 05/10/2018 1342  ? TRIG 88 12/04/2021 0106  ? HDL 53 12/04/2021 0106  ? HDL 54 05/10/2018 1342  ? CHOLHDL 4.1 12/04/2021 0106  ? VLDL 18 12/04/2021 0106  ? [place] 148 (H) 12/04/2021 0106  ? LDLCALC 115 (H) 05/10/2018 1342  ? ?Lab Results  ?Component Value Date  ? HGBA1C 8.3 (H) 12/04/2021  ? ?   ?Component Value Date/Time  ? [place] DETECTED 12/03/2021 1838  ?  [place] DETECTED 12/03/2021 1838  ? LABBENZ NONE DETECTED 12/03/2021 1838  ? AMPHETMU NONE DETECTED 12/03/2021 1838  ? [place] DETECTED 12/03/2021 1838  ? LABBARB NONE DETECTED 12/03/2021 1838  ?  ?No results for input(s): ETH in the last 168 hours. ? ?I have personally reviewed the radiological images below and agree with the radiology interpretations. ? ?CT ANGIO HEAD NECK W WO CM ? 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 EXAM: CT ANGIOGRAPHY HEAD AND NECK TECHNIQUE: Multidetector CT imaging of the head and neck was performed using the standard protocol during bolus administration of intravenous contrast. Multiplanar CT image reconstructions and MIPs were obtained to evaluate the vascular anatomy. Carotid stenosis measurements (when applicable) are obtained utilizing NASCET criteria, using the distal internal carotid diameter as the denominator. RADIATION DOSE REDUCTION: This exam was performed according to the departmental dose-optimization program which includes automated exposure control, adjustment of the mA and/or kV according to patient size and/or use of iterative reconstruction technique. CONTRAST:  59m OMNIPAQUE IOHEXOL 350 MG/ML SOLN COMPARISON:  Brain MRI, head and neck MRA yesterday. Head CT yesterday. FINDINGS: CT HEAD Brain: Small right anterior frontal lobe cortical infarct remains occult  by CT. No acute intracranial hemorrhage identified. No midline shift, mass effect, or evidence of intracranial mass lesion. No ventriculomegaly. Chronic left greater than right cerebellar infarcts. Calvarium and skull base: No acute osseous abnormality identified. Paranasal sinuses: Visualized paranasal sinuses and mastoids are stable and well aerated. Orbits: Visualized orbits and scalp soft tissues are within normal limits. CTA NECK Skeleton:  Scattered dental caries and periapical dental lucency. Mild for age cervical spine degeneration. No acute osseous abnormality identified. Upper chest: Negative. Other neck: Negative. Aortic arch: 3 vessel arch configuration. Mild arch atherosclerosis. Right carotid system: Soft and calcified brachiocephalic artery plaque without stenosis. Negative right CCA origin. Mild plaque at the right carotid bifurcation. No stenosis. Left carotid system: Mild plaque at the left ICA origin and bulb without stenosis. Vertebral arteries: Normal proximal right subclavian artery. Highly diminutive right vertebral artery throughout the neck, with the corresponding small bony right cervical transverse foramen. Mild proximal left subclavian artery plaque without stenosis. Normal left vertebral artery origin. Dominant although diminutive and intermittently irregular left vertebral artery is patent to the skull base with no hemodynamically significant stenosis. CTA HEAD Posterior circulation: Dominant left vertebral artery supplies the basilar with mild to moderate irregularity of the left V4 segment (series 11, image 137). No significant stenosis. Non dominant highly diminutive right vertebral artery functionally terminates at the skull base or in PICA. Patent although diminutive basilar artery with mild atherosclerotic irregularity. No high-grade basilar stenosis. Patent PCA origins. SCA is are diminutive. Bilateral PCAs are also diminutive but remain patent. There is moderate long segment stenosis of the left PCA P1 and P2 Anterior circulation: Both ICA siphons are atherosclerotic and irregular but remain patent. On the left there is mild to moderate siphon stenosis. On the right there is moderate to severe siphon stenosis in the cavernous segment (series 13, image 86) and in the supraclinoid segment (image 89). Both carotid termini are patent. Patent MCA and ACA origins. Azygous appearing ACA anatomy. No definite ACA branch  occlusion. Both MCA M1 segments and bifurcations are patent without stenosis. No MCA branch occlusion identified. Bilateral M3 branches are mildly irregular. Venous sinuses: Patent. Anatomic variants: Dominant left and congenitally diminutive right vertebral arteries. 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 Review of the MIP images confirms the above findings IMPRESSION: 1. Negative for large vessel occlusion. 2. Relatively mild extracranial but moderate to severe Intracranial Atherosclerosis: - mild to moderate stenosis of the Distal Left Vertebral Artery which supplies the Basilar (congenital highly diminutive right vertebral.). - up to moderate stenosis Left PCA P1/P2. - up to moderate stenosis diminutive and irregular Left ICA siphon. - tandem moderate to severe stenoses of the diminutive and irregular Right ICA siphon. 3. Small right anterior frontal lobe cortical infarct remains occult by CT. Chronic cerebellar infarcts. No new intracranial abnormality. Electronically Signed   By: Genevie Ann M.D.   On: 12/04/2021 09:13  ? ?CT Head Wo Contrast ? ?Result Date: 12/03/2021 ?CLINICAL DATA:  TIA, numbness EXAM: CT HEAD WITHOUT CONTRAST TECHNIQUE: Contiguous axial images were obtained from the base of the skull through the vertex without intravenous contrast. RADIATION DOSE REDUCTION: This exam was performed according to the departmental dose-optimization program which includes automated exposure control, adjustment of the mA and/or kV according to patient size and/or use of iterative reconstruction technique. COMPARISON:  MRI head 08/02/2013 FINDINGS: Brain: No acute intracranial hemorrhage, mass effect, or herniation. No extra-axial fluid collections. No evidence of acute territorial infarct. No hydrocephalus. Multiple foci of encephalomalacia in the bilateral  cerebellum left greater than right, consistent with old infarcts. Vascular: No hyperdense vessel or unexpected calcification. Skull: Normal. Negative for fracture or  focal lesion. Sinuses/Orbits: No acute finding. Other: None. IMPRESSION: 1. No acute intracranial process identified. 2. 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 Electronically Signed   By: Tiburcio Pea.

## 2021-12-06 NOTE — Transfer of Care (Signed)
Immediate Anesthesia Transfer of Care Note ? ?Patient: Tommy Jimenez ? ?Procedure(s) Performed: TRANSESOPHAGEAL ECHOCARDIOGRAM (TEE) ?BUBBLE STUDY ? ?Patient Location: PACU ? ?Anesthesia Type:General ? ?Level of Consciousness: awake ? ?Airway & Oxygen Therapy: Patient Spontanous Breathing ? ?Post-op Assessment: Report given to RN and Post -op Vital signs reviewed and stable ? ?Post vital signs: Reviewed and stable ? ?Last Vitals:  ?Vitals Value Taken Time  ?BP 109/72   ?Temp    ?Pulse 93 12/06/21 1349  ?Resp 15 12/06/21 1349  ?SpO2 100 % 12/06/21 1349  ?Vitals shown include unvalidated device data. ? ?Last Pain:  ?Vitals:  ? 12/06/21 1241  ?TempSrc: Temporal  ?PainSc: 0-No pain  ?   ? ?  ? ?Complications: No notable events documented. ?

## 2021-12-06 NOTE — Discharge Summary (Signed)
?Physician Discharge Summary  ?Tommy Jimenez EXN:[id number] DOB: 03/02/1966 DOA: 12/03/2021 ? ?PCP: [name], PA-C ? ?Admit date: 12/03/2021 ?Discharge date: 12/06/2021 ? ?Admitted From: Home ?Disposition: Home ? 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 ?Home Health: No ?Equipment/Devices: None ? ?Discharge Condition: Stable ?CODE STATUS: Full code ?Diet recommendation: Heart healthy/consistent carb regular diet ? ?History of present illness: ? ?Tommy Jimenez is a 56 year old male with past medical history significant for type 2 diabetes mellitus, cardiomyopathy, hyperlipidemia, CAD on aspirin and Brilinta who initially presented to [facility] ED on 3/17 with 2 episodes of right-sided numbness and weakness.  After leaving work the day prior, patient noted that his right leg suddenly went numb and weak; lasting approximately 10 minutes.  Today he reported recurrent right leg weakness as well as right hand weakness in which he sought care in the emergency department.  Also reported some dizziness that has resolved. ? ?In the ED, temperature 97.5 ?F, HR 100, RR 16, BP 153/90, SPO2 100% on room air.  Sodium 132, potassium 4.4, chloride 101, CO2 24, glucose 300, BUN 18, creatinine 0.87, glucose 300.  LFTs within normal limits.  WBC  8.9, hemoglobin 12.6, platelets 322.  UDS negative.  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  MR brain without contrast with small focus of acute ischemia in the right frontal cortex, no hemorrhage/mass effect, multiple old cerebellar infarcts and findings of small vessel ischemia that is chronic. MRA head/neck with no emergent large vessel occlusion or high-grade stenosis, moderate stenosis of the mid V2 segment of the left vertebral artery.  Neurology was consulted.  Patient was transferred to [facility] for stroke team evaluation and further work-up under hospitalist service. ? ? ?Hospital course: ? ?Assessment and Plan: ?CVA (cerebral vascular accident) ([place]) ?Patient presenting to ED with recurrent right lower extremity followed by right upper/lower extremity weakness and numbness with associated dizziness.  MRI brain with small focus of acute ischemia right frontal cortex.  MRA head/neck with no emergent large vessel occlusion or high-grade stenosis.  Hemoglobin A1c 8.3, LDL 148.  Seen by PT/OT/SLP with no needs identified.  TTE with LVEF 20-25%.  TEE with no LV thrombus but positive bubble study suggestive for PFO.  Neurology was consulted and followed during hospital course.  Will discharge home on Plavix 75 mg p.o. daily, Eliquis 5 mg p.o. twice daily, Crestor 40 mg p.o. daily.  30-day event monitor will be mailed to patient's home.  Outpatient follow-up with neurology 4 weeks. ? ? ?Diabetes mellitus ([place]) ?Hemoglobin A1c 8.3, poorly controlled.  Has been having intermittent use of insulin at home due to cost and lack of insurance.  Will discharge home on ReliOn NPH 70/30 22 units subcutaneously twice daily.  On Plavix and statin.  Outpatient follow-up with PCP scheduled will need likely further titration and monitoring.  Follow-up C-peptide,  that was pending at time of discharge. ? ?Hyperlipidemia ?Lipid panel with total cholesterol  219, HDL 53, LDL 148, triglycerides 88.  Was previously on atorvastatin 80 mg p.o. daily, but stopped it due to muscle cramps causing him difficulty with standing. Started on Crestor 40 mg p.o. daily.  Will need repeat LFTs/lipid panel 6/8 weeks and if LDL still greater than 55, cardiology will refer to Pharm.D. for PCSK9i.  ? ?Essential hypertension, benign ?Metoprolol succinate 12.5 mg p.o. daily, losartan 12.5 mg p.o. daily.  Outpatient follow-up with cardiology for further titration. ? ?Chronic systolic CHF (congestive heart failure), NYHA class 2 ([place]) ?Previous TTE in care everywhere 05/31/2017 with LVEF 30-35%.  History of cardiac arrest with STEMI and LAD occlusion s/p PCI/stent 2018 at [facility].  Has been lost to follow-up.  Repeat TTE 3/18 with LVEF 20-25%, LV severely decreased function, LV global hypokinesis, LV mildly dilated, mild LVH, LA moderately dilated.  Patient is euvolemic on exam.  Started on metoprolol succinate 12.5 mg p.o. daily, losartan 12.5 mg p.o. daily.  On Plavix, Crestor, Eliquis. If EF remains <35% after heart failure treatment, may benefit from ICD for primary prevention.  ? ?CAD S/P percutaneous coronary angioplasty ?History of STEMI and brief cardiac arrest 2018; went emergently to the Cath Lab with notable acute occlusion ostial LAD s/p PCI/stent placement at [facility] [place]. Cardiology to consider myocardial nuclear perfusion study outpatient.  Continue Crestor, Plavix and Eliquis. ? ?Hyponatremia ?Sodium 132, mild.  Encourage increase oral intake.  Sodium improved to 134 at time of discharge.  Recommend repeat BMP at next PCP visit. ? ?Hypocalcemia ?Calcium 8.6, slightly low.  Outpatient follow-up PCP. ? ?Normocytic anemia ?Hemoglobin 12.6, stable. ? ?Tobacco abuse ?Counseled on need for complete cessation. ? ?Dental infection ?Patient complaining of dental pain to right upper molar.  Has upcoming appointment with dentist for evaluation and  treatment. Augmentin 875-125 mg p.o. twice daily x 7 days ? ? ? ? ? ? ?Discharge Diagnoses:  ?Active Problems: ?  CVA (cerebral vascular accident) ([place]) ?  Diabetes mellitus ([place]) ?  Hyperlipidemia ?  Essential hypertension, benign ?  CAD S/P percutaneous coronary angioplasty ?  Chronic systolic CHF (congestive heart failure), NYHA class 2 ([place]) ?  Hyponatremia ?  Hypocalcemia ?  Normocytic anemia ?  Tobacco abuse ?  Dental infection ? ? ? ?Discharge Instructions ? ?Discharge Instructions   ? ? Ambulatory referral to Neurology   Complete by: As directed ?  ? An appointment is requested in approximately: 4 weeks  ? Call MD for:  extreme fatigue   Complete by: As directed ?  ? Call MD for:  persistant dizziness or light-headedness   Complete by: As directed ?  ? Call MD for:  persistant nausea and vomiting   Complete by: As directed ?  ? Call MD for:  redness, tenderness, or signs of infection (pain, swelling, redness, odor or green/yellow discharge around incision site)   Complete by: As directed ?  ? Call MD for:  severe uncontrolled pain   Complete by: As directed ?  ? Call MD for:  temperature >100.4   Complete by: As directed ?  ? Diet - low sodium heart healthy   Complete by: As directed ?  ? Increase activity slowly   Complete by: As directed ?  ? ?  ? ?Allergies as of 12/06/2021   ?No Known Allergies ?  ? ?  ?Medication List  ?  ? ?STOP taking  these medications   ? ?aspirin EC 81 MG tablet ?  ?atorvastatin 80 MG tablet ?Commonly known as: LIPITOR ?  ?BRILINTA PO ?  ?glipiZIDE 10 MG 24 hr tablet ?Commonly known as: GLUCOTROL XL ?  ?lisinopril 5 MG tablet ?Commonly known as: ZESTRIL ?  ?metoprolol tartrate 25 MG tablet ?Commonly known as: LOPRESSOR ?  ?tadalafil 10 MG tablet ?Commonly known as: Cialis ?  ? ?  ? ?TAKE these medications   ? ?amoxicillin-clavulanate 875-125 MG tablet ?Commonly known as: AUGMENTIN ?Take 1 tablet by mouth every 12 (twelve) hours for 7 days. ?  ?apixaban 5 MG Tabs tablet ?Commonly  known as: ELIQUIS ?Take 1 tablet (5 mg total) by mouth 2 (two) times daily. ?  ?clopidogrel 75 MG tablet ?Commonly known as: Plavix ?Take 1 tablet (75 mg total) by mouth daily. ?  ?losartan 25 MG tablet ?Commonly

## 2021-12-06 NOTE — CV Procedure (Signed)
? ? ? ?  TRANSESOPHAGEAL ECHOCARDIOGRAM  ? ?NAME:  Keefer Soulliere   MRN: [id number] ?DOB:  03/30/1966   ADMIT DATE: 12/03/2021 ? ?INDICATIONS: ?CVA ? ?PROCEDURE:  ? ?Informed consent was obtained prior to the procedure. The risks, benefits and alternatives for the procedure were discussed and the patient comprehended these risks.  Risks include, but are not limited to, cough, sore throat, vomiting, nausea, somnolence, esophageal and stomach trauma or perforation, bleeding, low blood pressure, aspiration, pneumonia, infection, trauma to the teeth and death.   ? ?After a procedural time-out, the oropharynx was anesthetized and the patient was sedated by the anesthesia service. The transesophageal probe was inserted in the esophagus and stomach without difficulty and multiple views were obtained. Anesthesia was monitored by Erick Colace, CRNA and Dr Doroteo Glassman.  ? ? ?COMPLICATIONS:   ? ?There were no immediate complications. ? ?FINDINGS: ? ?Severe LV systolic dysfunction.  Positive bubble study suggesting PFO ? ? ?Oswaldo Milian MD ?[facility]  ?[address], [address] ?[place], [place] [address] ?[phone]  ? ?2:16 PM ? ? ?

## 2021-12-06 NOTE — Progress Notes (Signed)
30 day monitor for stroke ?

## 2021-12-06 NOTE — Progress Notes (Signed)
?   12/06/21 0920  ?Clinical Encounter Type  ?Visited With Patient  ?Visit Type Initial;Spiritual support  ?Referral From Nurse  ?Consult/Referral To Chaplain  ? ?Chaplain responded to a spiritual consult for advanced directive education. Chaplain went over the entire document with the patient, Tommy Jimenez.  He was interested in establishing his wishes for his care going forward. He did want to read the paperwork over and think about his choices. I advised Tommy Jimenez that when he completed the forms to let his nurse know and they would reach out to the chaplain office for follow-up.   ? ?Danice Goltz ?Chaplain Resident ?[facility]  ?[phone]   ?

## 2021-12-06 NOTE — Anesthesia Preprocedure Evaluation (Addendum)
Anesthesia Evaluation  ?Patient identified by MRN, date of birth, ID band ?Patient awake ? ? ? ?Reviewed: ?Allergy & Precautions, NPO status , Patient's Chart, lab work & pertinent test results, reviewed documented beta blocker date and time  ? ?Airway ?Mallampati: II ? ?TM Distance: >3 FB ?Neck ROM: Full ? ? ? Dental ? ?(+) Poor Dentition, Dental Advisory Given, Missing,  ?  ?Pulmonary ?Patient abstained from smoking., former smoker,  ?  ?Pulmonary exam normal ?breath sounds clear to auscultation ? ? ? ? ? ? Cardiovascular ?hypertension, Pt. on medications and Pt. on home beta blockers ?+ CAD, + Cardiac Stents (2018), + Peripheral Vascular Disease and +CHF (LVEF 20-25%)  ?Normal cardiovascular exam ?Rhythm:Regular Rate:Normal ? ?11/2021: ??1. Left ventricular ejection fraction, by estimation, is 20 to 25%. The  ?left ventricle has severely decreased function. The left ventricle  ?demonstrates global hypokinesis. The left ventricular internal cavity size  ?was mildly dilated. There is moderate  ?left ventricular hypertrophy. Left ventricular diastolic parameters are  ?indeterminate.  ??2. Right ventricular systolic function is normal. The right ventricular  ?size is normal.  ??3. Left atrial size was moderately dilated.  ??4. No evidence of mitral valve regurgitation.  ??5. Aortic valve regurgitation is not visualized.  ?  ?Neuro/Psych ?Stroke:  right frontal small infarct.  However, patient's symptoms on the right, not fit to current stroke on MRI, concerning embolic, likely cardiac source given extensive cardiac history ?? CT no acute abnormality, old infarct bilateral cerebellum ?? MRI right frontal small infarct ?? MRA head and neck left V2 moderate stenosis ?? CTA head and neck showed moderate to severe stenosis bilateral ICA siphon, right more than left.  Moderate stenosis left P1/P2, left VA. ?CVA, No Residual Symptoms negative psych ROS  ? GI/Hepatic ?negative GI ROS, Neg  liver ROS,   ?Endo/Other  ?diabetes, Poorly Controlled, Type 2, Insulin Dependent, Oral Hypoglycemic Agentsa1c 8.3 ? Renal/GU ?negative Renal ROS  ?negative genitourinary ?  ?Musculoskeletal ?negative musculoskeletal ROS ?(+)  ? Abdominal ?  ?Peds ? Hematology ?negative hematology ROS ?(+)   ?Anesthesia Other Findings ?Notified by telemetry that pt was noted to have ST elevation >3 (3.2 in MCL & V leads); last EKG on 3/17 was noted to have slight elevation but not as pronounced as stated above ? Reproductive/Obstetrics ?negative OB ROS ? ?  ? ? ? ? ? ? ? ? ? ? ? ? ? ?  ?  ? ? ? ? ? ? ? ?Anesthesia Physical ?Anesthesia Plan ? ?ASA: 4 ? ?Anesthesia Plan: MAC  ? ?Post-op Pain Management:   ? ?Induction:  ? ?PONV Risk Score and Plan: 2 and Propofol infusion and TIVA ? ?Airway Management Planned: Natural Airway and Simple Face Mask ? ?Additional Equipment: None ? ?Intra-op Plan:  ? ?Post-operative Plan:  ? ?Informed Consent: I have reviewed the patients History and Physical, chart, labs and discussed the procedure including the risks, benefits and alternatives for the proposed anesthesia with the patient or authorized representative who has indicated his/her understanding and acceptance.  ? ? ? ?Dental advisory given ? ?Plan Discussed with: CRNA ? ?Anesthesia Plan Comments:   ? ? ? ? ? ?Anesthesia Quick Evaluation ? ?

## 2021-12-06 NOTE — Progress Notes (Signed)
Opened in error

## 2021-12-06 NOTE — Anesthesia Procedure Notes (Signed)
Procedure Name: General with mask airway ?Date/Time: 12/06/2021 1:12 PM ?Performed by: Erick Colace, CRNA ?Pre-anesthesia Checklist: Patient identified, Emergency Drugs available, Patient being monitored, Timeout performed and Suction available ?Patient Re-evaluated:Patient Re-evaluated prior to induction ?Oxygen Delivery Method: Nasal cannula ?Preoxygenation: Pre-oxygenation with 100% oxygen ?Induction Type: IV induction ?Dental Injury: Teeth and Oropharynx as per pre-operative assessment  ? ? ? ? ?

## 2021-12-06 NOTE — Progress Notes (Signed)
Notified by telemetry that pt was noted to have ST elevation >3 (3.2 in MCL & V leads); last EKG on 3/17 was noted to have slight elevation but not as pronounced as stated above. On call provider Dr. Marlowe Sax notified, awaiting response. ?

## 2021-12-06 NOTE — Interval H&P Note (Signed)
History and Physical Interval Note: ? ?12/06/2021 ?1:13 PM ? ?Tommy Jimenez  has presented today for surgery, with the diagnosis of stroke.  The various methods of treatment have been discussed with the patient and family. After consideration of risks, benefits and other options for treatment, the patient has consented to  Procedure(s): ?TRANSESOPHAGEAL ECHOCARDIOGRAM (TEE) (N/A) as a surgical intervention.  The patient's history has been reviewed, patient examined, no change in status, stable for surgery.  I have reviewed the patient's chart and labs.  Questions were answered to the patient's satisfaction.   ? ? ?Donato Heinz ? ? ?

## 2021-12-06 NOTE — Progress Notes (Addendum)
? ?Progress Note ? ?Patient Name: Tommy Jimenez ?Date of Encounter: 12/06/2021 ? ?Primary Cardiologist: Sanda Klein, MD ? ?Subjective  ? ?Patient seen prior to TEE. No Cp, SOB. Just feels generally weak. ? ?Inpatient Medications  ?  ?Scheduled Meds: ? amoxicillin-clavulanate  1 tablet Oral Q12H  ? aspirin  81 mg Oral Daily  ? feeding supplement  237 mL Oral BID BM  ? insulin aspart  0-15 Units Subcutaneous TID WC  ? insulin aspart  0-5 Units Subcutaneous QHS  ? insulin glargine-yfgn  35 Units Subcutaneous QHS  ? losartan  12.5 mg Oral Daily  ? metoprolol succinate  12.5 mg Oral Daily  ? rosuvastatin  40 mg Oral Daily  ? ticagrelor  90 mg Oral BID  ? ?Continuous Infusions: ? sodium chloride 20 mL/hr at 12/06/21 1232  ? ?PRN Meds: ?acetaminophen **OR** acetaminophen (TYLENOL) oral liquid 160 mg/5 mL **OR** acetaminophen  ? ?Vital Signs  ?  ?Vitals:  ? 12/05/21 1600 12/06/21 0517 12/06/21 0752 12/06/21 1129  ?BP: 113/69 127/74 126/83 106/66  ?Pulse: 95 84 74 82  ?Resp: '20 20 18 14  '$ ?Temp: 99 ?F (37.2 ?C) 98.3 ?F (36.8 ?C) 98.3 ?F (36.8 ?C) 98.5 ?F (36.9 ?C)  ?TempSrc: Oral Oral Oral Oral  ?SpO2: 99% 100% 100% 100%  ?Weight:      ?Height:      ? ?No intake or output data in the 24 hours ending 12/06/21 1235 ?Last 3 Weights 12/03/2021 03/29/2020 02/04/2020  ?Weight (lbs) 155 lb 164 lb 160 lb  ?Weight (kg) 70.308 kg 74.39 kg 72.576 kg  ?  ? ?Telemetry  ?  ?NSR - Personally Reviewed ? ?Physical Exam  ? ?GEN: No acute distress. Thin. ?HEENT: Normocephalic, atraumatic, sclera non-icteric. ?Neck: No JVD or bruits. ?Cardiac: RRR no murmurs, rubs, or gallops.  ?Respiratory: Clear to auscultation bilaterally. Breathing is unlabored. ?GI: Soft, nontender, non-distended, BS +x 4. ?MS: no deformity. ?Extremities: No clubbing or cyanosis. No edema. Distal pedal pulses are 2+ and equal bilaterally. ?Neuro:  AAOx3. Follows commands. ?Psych:  Responds to questions appropriately with a normal affect. ? ?Labs  ?  ?High Sensitivity  Troponin:   ?Recent Labs  ?Lab 12/06/21 ?0250 12/06/21 ?2706  ?TROPONINIHS 22* 22*  ?   ? ?Cardiac EnzymesNo results for input(s): TROPONINI in the last 168 hours. No results for input(s): TROPIPOC in the last 168 hours.  ? ?Chemistry ?Recent Labs  ?Lab 12/03/21 ?1241 12/06/21 ?0250  ?NA 132* 134*  ?K 4.4 4.0  ?CL 101 103  ?CO2 24 23  ?GLUCOSE 300* 183*  ?BUN 18 21*  ?CREATININE 0.87 0.92  ?CALCIUM 8.6* 8.9  ?PROT 7.5  --   ?ALBUMIN 4.0  --   ?AST 15  --   ?ALT 12  --   ?ALKPHOS 72  --   ?BILITOT 0.5  --   ?GFRNONAA >60 >60  ?ANIONGAP 7 8  ?  ? ?Hematology ?Recent Labs  ?Lab 12/03/21 ?1241  ?WBC 8.9  ?RBC 4.41  ?HGB 12.6*  ?HCT 37.9*  ?MCV 85.9  ?MCH 28.6  ?MCHC 33.2  ?RDW 13.9  ?PLT 322  ? ? ?BNPNo results for input(s): BNP, PROBNP in the last 168 hours.  ? ?DDimer No results for input(s): DDIMER in the last 168 hours.  ? ?Radiology  ?  ?ECHOCARDIOGRAM COMPLETE ? ?Result Date: 12/04/2021 ?   ECHOCARDIOGRAM REPORT   Patient Name:   Tommy Jimenez Date of Exam: 12/04/2021 Medical Rec #:  [id number]  Height:       68.0 in Accession #:    [id number]       Weight:       155.0 lb Date of Birth:  October 08, 1965         BSA:          1.834 m? Patient Age:    56 years         BP:           144/78 mmHg Patient Gender: M                HR:           91 bpm. Exam Location:  Inpatient Procedure: 2D Echo, Cardiac Doppler and Color Doppler Indications:    TIA G45.9  History:        Patient has no prior history of Echocardiogram examinations.                 Cardiomyopathy, CAD and Previous Myocardial Infarction; Risk                 Factors:Hypertension, Diabetes, Dyslipidemia and Tobacco abuse.                 CAD S/P percutaneous coronary angioplasty, Acute CVA                 (cerebrovascular accident) ([place]), History of DVT (deep vein                 thrombosis).  Sonographer:    Alvino Chapel RCS Referring Phys: [id number] [place]  1. Left ventricular ejection fraction, by estimation, is 20 to 25%. The left  ventricle has severely decreased function. The left ventricle demonstrates global hypokinesis. The left ventricular internal cavity size was mildly dilated. There is moderate left ventricular hypertrophy. Left ventricular diastolic parameters are indeterminate.  2. Right ventricular systolic function is normal. The right ventricular size is normal.  3. Left atrial size was moderately dilated.  4. No evidence of mitral valve regurgitation.  5. Aortic valve regurgitation is not visualized. Comparison(s): No prior Echocardiogram. FINDINGS  Left Ventricle: Left ventricular ejection fraction, by estimation, is 20 to 25%. The left ventricle has severely decreased function. The left ventricle demonstrates global hypokinesis. The left ventricular internal cavity size was mildly dilated. There is moderate left ventricular hypertrophy. Left ventricular diastolic parameters are indeterminate. Right Ventricle: The right ventricular size is normal. No increase in right ventricular wall thickness. Right ventricular systolic function is normal. Left Atrium: Left atrial size was moderately dilated. Right Atrium: Right atrial size was normal in size. Pericardium: There is no evidence of pericardial effusion. Mitral Valve: No evidence of mitral valve regurgitation. Tricuspid Valve: Tricuspid valve regurgitation is not demonstrated. Aortic Valve: Aortic valve regurgitation is not visualized. Pulmonic Valve: Pulmonic valve regurgitation is trivial.  LEFT VENTRICLE PLAX 2D LVIDd:         5.50 cm      Diastology LVIDs:         4.70 cm      LV e' medial:    6.74 cm/s LV PW:         1.20 cm      LV E/e' medial:  8.5 LV IVS:        0.90 cm      LV e' lateral:   3.81 cm/s LVOT diam:     1.90 cm      LV E/e' lateral: 15.0 LV SV:  29 LV SV Index:   16 LVOT Area:     2.84 cm?  LV Volumes (MOD) LV vol d, MOD A2C: 135.0 ml LV vol d, MOD A4C: 82.2 ml LV vol s, MOD A2C: 114.0 ml LV vol s, MOD A4C: 64.3 ml LV SV MOD A2C:     21.0 ml LV SV MOD  A4C:     82.2 ml LV SV MOD BP:      18.7 ml RIGHT VENTRICLE RV S prime:     11.40 cm/s TAPSE (M-mode): 2.3 cm LEFT ATRIUM             Index        RIGHT ATRIUM           Index LA diam:        3.50 cm 1.91 cm/m?   RA Area:     13.10 cm? LA Vol (A2C):   44.0 ml 23.97 ml/m?  RA Volume:   30.70 ml  16.74 ml/m? LA Vol (A4C):   61.6 ml 33.59 ml/m? LA Biplane Vol: 56.6 ml 30.86 ml/m?  AORTIC VALVE LVOT Vmax:   62.00 cm/s LVOT Vmean:  38.700 cm/s LVOT VTI:    0.104 m  AORTA Ao Root diam: 3.10 cm MITRAL VALVE MV Area (PHT): 3.39 cm?    SHUNTS MV Decel Time: 224 msec    Systemic VTI:  0.10 m MV E velocity: 57.20 cm/s  Systemic Diam: 1.90 cm MV A velocity: 88.40 cm/s MV E/A ratio:  0.65 Phineas Inches Electronically signed by Phineas Inches Signature Date/Time: 12/04/2021/6:31:09 PM    Final    ? ?Cardiac Studies  ? ?Echo 11/2021 ? 1. Left ventricular ejection fraction, by estimation, is 20 to 25%. The  ?left ventricle has severely decreased function. The left ventricle  ?demonstrates global hypokinesis. The left ventricular internal cavity size  ?was mildly dilated. There is moderate  ?left ventricular hypertrophy. Left ventricular diastolic parameters are  ?indeterminate.  ? 2. Right ventricular systolic function is normal. The right ventricular  ?size is normal.  ? 3. Left atrial size was moderately dilated.  ? 4. No evidence of mitral valve regurgitation.  ? 5. Aortic valve regurgitation is not visualized.  ? ?Comparison(s): No prior Echocardiogram.  ? ?TEE pending ? ?Patient Profile  ?   ?56 y.o. male with IDDM since age 77, out of hospital cardiac arrest 05/2017 with CAD/anterior STEMI and occluded LAD s/p DES, normal elsewhere [facility]), ICM EF 35% at time of MI, spotty follow-up due to social limitations/inconsistent medication compliance, former tobacco abuse, HTN, HLD admitted with 2 episodes of right sided numbness and weakness. TPA not given due to symptom resolution. Found to have right frontal small infarct, however, does  not fit distribution of patient's symptoms so concerning for embolic source given extensive cardiac history. Patient reports adherence to home regimen including DAPT though LDL found to be elevated (previous

## 2021-12-06 NOTE — Progress Notes (Signed)
?  Echocardiogram ?Echocardiogram Transesophageal has been performed. ? ?Tommy Jimenez ?12/06/2021, 1:52 PM ?

## 2021-12-06 NOTE — Progress Notes (Signed)
?PROGRESS NOTE ? ? ? ?Leeland Lovelady  WIO:[id number] DOB: Jul 28, 1966 DOA: 12/03/2021 ?PCP: [name], PA-C  ? ? ?Brief Narrative:  ?Herbert Marken is a 56 year old male with past medical history significant for type 2 diabetes mellitus, cardiomyopathy, hyperlipidemia, CAD on aspirin and Brilinta who initially presented to [facility] ED on 3/17 with 2 episodes of right-sided numbness and weakness.  After leaving work the day prior, patient noted that his right leg suddenly went numb and weak; lasting approximately 10 minutes.  Today he reported recurrent right leg weakness as well as right hand weakness in which he sought care in the emergency department.  Also reported some dizziness that has resolved. ? ?In the ED, temperature 97.5 ?F, HR 100, RR 16, BP 153/90, SPO2 100% on room air.  Sodium 132, potassium 4.4, chloride 101, CO2 24, glucose 300, BUN 18, creatinine 0.87, glucose 300.  LFTs within normal limits.  WBC 8.9, hemoglobin 12.6, platelets 322.  UDS negative.  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  MR brain without contrast with small focus of acute ischemia in the right frontal cortex, no hemorrhage/mass effect, multiple old cerebellar infarcts and findings of small vessel ischemia that is chronic. MRA head/neck with no emergent large vessel occlusion or high-grade stenosis, moderate stenosis of the mid V2 segment of the left vertebral artery.  Neurology was consulted.  Patient was transferred to [facility] for stroke team evaluation and further work-up under hospitalist service. ?  ? ?  ?Assessment and Plan: ?CVA (cerebral vascular accident) ([place]) ?Patient presenting to ED with recurrent right lower extremity followed by right upper/lower extremity weakness and numbness with associated dizziness.  MRI brain with small focus of acute ischemia right frontal cortex.  MRA head/neck with no emergent large vessel occlusion or high-grade  stenosis.  Hemoglobin A1c 8.3, LDL 148.  Seen by PT/OT/SLP with no needs identified. ?--Neurology following, appreciate assistance ?--TTE: LVEF 20-25% ?--Aspirin 81 mg p.o. daily ?--Brilinta '90mg'$  BID ?--Crestor 40 mg p.o. daily ?--Monitor on telemetry ?--Plan for TEE and likely loop recorder, per neurology ? ? ?Diabetes mellitus ([place]) ?Hemoglobin A1c 8.3, poorly controlled.  Currently taking insulin 70/30 28 units [place] daily at home. ?--Semglee 35 units [place] daily ?--moderate SSI for coverge ?--CBGs qAC/HS ? ?Hyperlipidemia ?Lipid panel with total cholesterol 219, HDL 53, LDL 148, triglycerides 88.  Was previously on atorvastatin 80 mg p.o. daily, but stopped it due to muscle cramps causing him difficulty with standing. ?--Started on Crestor 40 mg p.o. daily ? ?Essential hypertension, benign ?Home regimen includes metoprolol tartrate 37.5 mg p.o. twice daily. ?--Holding antihypertensives to allow permissive hypertension in the setting of acute CVA ? ?Chronic systolic CHF (congestive heart failure), NYHA class 2 ([place]) ?Previous TTE in care everywhere 05/31/2017 with LVEF 30-35%.  History of cardiac arrest with STEMI and LAD occlusion s/p PCI/stent 2018 at [facility].  Has been lost to follow-up.  Repeat TTE 3/18 with LVEF 20-25%, LV severely decreased function, LV global hypokinesis, LV mildly dilated, mild LVH, LA moderately dilated.  Patient is euvolemic on exam. ?--Start metoprolol succinate 12.5 mg p.o. daily ?--Start losartan 12.5 mg p.o. daily ?--If EF remains <35% after heart failure treatment, may benefit from ICD for primary prevention ?--Strict I's and O's and daily weights ? ?CAD S/P percutaneous coronary angioplasty ?History of STEMI and brief cardiac arrest 2018; went emergently to the Cath Lab with notable acute occlusion ostial LAD s/p PCI/stent placement at  [facility]. ?--Cardiology to consider myocardial nuclear perfusion study outpatient ?--Continue aspirin and  Brilinta ? ?Hyponatremia ?Sodium 132, mild.  Encourage increase oral intake ? ?Hypocalcemia ?Calcium 8.6, slightly low.  Outpatient follow-up PCP. ? ?Normocytic anemia ?Hemoglobin 12.6, stable. ? ?Tobacco abuse ?Counseled on need for complete cessation. ? ?Dental infection ?Patient complaining of dental pain to right upper molar.  Has upcoming appointment with dentist for evaluation and treatment. ?--Augmentin 875-125 mg p.o. twice daily x 7 days ? ? ? ? ? ?DVT prophylaxis: SCD's Start: 12/03/21 1838 ? ?  Code Status: Full Code ?Family Communication: No family present at bedside this morning ? ?Disposition Plan:  ?Level of care: Telemetry Medical ?Status is: Inpatient ?Remains inpatient appropriate because: pending TEE and loop recorder per neurology ?  ? ?Consultants:  ?Neurology ?Cardiology ? ?Procedures:  ?TTE ?TEE: pending ? ?Antimicrobials:  ?None ? ? ?Subjective: ?Patient seen examined bedside, resting comfortably.  No complaints or concerns this morning.  Pending TEE, possibly today. Denies headache, no dizziness, no chest pain, no palpitations, no shortness of breath, no abdominal pain, no fever/chills/night sweats, no nausea/vomiting/diarrhea, no current weakness, no fatigue, no paresthesia.  No acute events overnight per nursing staff. ? ?Objective: ?Vitals:  ? 12/05/21 1255 12/05/21 1600 12/06/21 0517 12/06/21 0752  ?BP: 124/86 113/69 127/74 126/83  ?Pulse: 100 95 84 74  ?Resp:  '20 20 18  '$ ?Temp:  99 ?F (37.2 ?C) 98.3 ?F (36.8 ?C) 98.3 ?F (36.8 ?C)  ?TempSrc:  Oral Oral Oral  ?SpO2:  99% 100% 100%  ?Weight:      ?Height:      ? ?No intake or output data in the 24 hours ending 12/06/21 1042 ? ?Filed Weights  ? 12/03/21 1208  ?Weight: 70.3 kg  ? ? ?Examination: ? ?Physical Exam: ?GEN: NAD, alert and oriented x 3, wd/wn ?HEENT: NCAT, PERRL, EOMI, sclera clear, MMM ?PULM: CTAB w/o wheezes/crackles, normal respiratory effort, on room air ?CV: RRR w/o M/G/R ?GI: abd soft, NTND, NABS, no R/G/M ?MSK: no peripheral  edema, muscle strength globally intact 5/5 bilateral upper/lower extremities ?NEURO: CN II-XII intact, no focal deficits, sensation to light touch intact ?PSYCH: normal mood/affect ?Integumentary: dry/intact, no rashes or wounds ? ? ? ?Data Reviewed: I have personally reviewed following labs and imaging studies ? ?CBC: ?Recent Labs  ?Lab 12/03/21 ?1241  ?WBC 8.9  ?NEUTROABS 4.8  ?HGB 12.6*  ?HCT 37.9*  ?MCV 85.9  ?PLT 322  ? ?Basic Metabolic Panel: ?Recent Labs  ?Lab 12/03/21 ?1241 12/06/21 ?0250  ?NA 132* 134*  ?K 4.4 4.0  ?CL 101 103  ?CO2 24 23  ?GLUCOSE 300* 183*  ?BUN 18 21*  ?CREATININE 0.87 0.92  ?CALCIUM 8.6* 8.9  ?MG 2.1 2.1  ? ?GFR: ?Estimated Creatinine Clearance: 88.8 mL/min (by C-G formula based on SCr of 0.92 mg/dL). ?Liver Function Tests: ?Recent Labs  ?Lab 12/03/21 ?1241  ?AST 15  ?ALT 12  ?ALKPHOS 72  ?BILITOT 0.5  ?PROT 7.5  ?ALBUMIN 4.0  ? ?No results for input(s): LIPASE, AMYLASE in the last 168 hours. ?No results for input(s): AMMONIA in the last 168 hours. ?Coagulation Profile: ?No results for input(s): INR, PROTIME in the last 168 hours. ?Cardiac Enzymes: ?No results for input(s): CKTOTAL, CKMB, CKMBINDEX, TROPONINI in the last 168 hours. ?BNP (last 3 results) ?No results for input(s): PROBNP in the last 8760 hours. ?HbA1C: ?Recent Labs  ?  12/04/21 ?0106  ?HGBA1C 8.3*  ? ?CBG: ?Recent Labs  ?Lab 12/05/21 ?9563 12/05/21 ?1156  12/05/21 ?1637 12/05/21 ?2115 12/06/21 ?0630  ?GLUCAP 213* 175* 225* 206* 184*  ? ?Lipid Profile: ?Recent Labs  ?  12/04/21 ?0106  ?CHOL 219*  ?HDL 53  ?LDLCALC 148*  ?TRIG 88  ?CHOLHDL 4.1  ? ?Thyroid Function Tests: ?No results for input(s): TSH, T4TOTAL, FREET4, T3FREE, THYROIDAB in the last 72 hours. ?Anemia Panel: ?Recent Labs  ?  12/04/21 ?0106  ?TIBC 297  ?IRON 67  ? ?Sepsis Labs: ?No results for input(s): PROCALCITON, LATICACIDVEN in the last 168 hours. ? ?No results found for this or any previous visit (from the past 240 hour(s)).  ? ? ? ? ? ?Radiology  Studies: ?ECHOCARDIOGRAM COMPLETE ? ?Result Date: 12/04/2021 ?   ECHOCARDIOGRAM REPORT   Patient Name:   REGINA COPPOLINO Date of Exam: 12/04/2021 Medical Rec #:  [id number]        Height:       68.0 in Accession #:

## 2021-12-06 NOTE — Assessment & Plan Note (Addendum)
Patient complaining of dental pain to right upper molar.  Has upcoming appointment with dentist for evaluation and treatment. Augmentin 875-125 mg p.o. twice daily x 7 days ?

## 2021-12-06 NOTE — Progress Notes (Signed)
Nutrition Brief Note ? ?Patient identified on the Malnutrition Screening Tool (MST) Report. ? ?Admitting Dx: TIA (transient ischemic attack) [G45.9] ?Weakness [R53.1] ?Acute CVA (cerebrovascular accident) ([employer]) [I63.9] ?PMH:  ?Past Medical History:  ?Diagnosis Date  ? Coronary artery disease   ? stent placed  ? Dilated cardiomyopathy ([place])   ? DM (diabetes mellitus) ([place]) 1996  ? DVT (deep venous thrombosis) ([place])   ? right leg   ? Erectile dysfunction   ? H/O acute myocardial infarction   ? H/O heart artery stent   ? History of DVT (deep vein thrombosis)   ? HTN (hypertension)   ? Ischemic cardiomyopathy   ? Myocardial infarction [facility])   ? Obesity   ? Peripheral vascular disease ([place])   ? Pure hypercholesterolemia   ? ? ?Medications:  ?Scheduled Meds: ? amoxicillin-clavulanate  1 tablet Oral Q12H  ? aspirin  81 mg Oral Daily  ? feeding supplement  237 mL Oral BID BM  ? insulin aspart  0-15 Units Subcutaneous TID WC  ? insulin aspart  0-5 Units Subcutaneous QHS  ? insulin glargine-yfgn  35 Units Subcutaneous QHS  ? losartan  12.5 mg Oral Daily  ? metoprolol succinate  12.5 mg Oral Daily  ? rosuvastatin  40 mg Oral Daily  ? ticagrelor  90 mg Oral BID  ?Continuous Infusions: ? sodium chloride 20 mL/hr at 12/06/21 1205  ? ?Labs: ?Recent Labs  ?Lab 12/03/21 ?1241 12/06/21 ?0250  ?NA 132* 134*  ?K 4.4 4.0  ?CL 101 103  ?CO2 24 23  ?BUN 18 21*  ?CREATININE 0.87 0.92  ?CALCIUM 8.6* 8.9  ?MG 2.1 2.1  ?GLUCOSE 300* 183*  ? ? ?Wt Readings from Last 15 Encounters:  ?12/03/21 70.3 kg  ?03/29/20 74.4 kg  ?02/04/20 72.6 kg  ?Body mass index is 23.57 kg/m?Marland Kitchen Patient meets criteria for normal based on current BMI.  ? ?Current diet order is heart healthy/carb modified, patient is consuming approximately 100% of meals at this time.  ? ?Pt already receiving Ensure Enlive BID as part of oral nutrition care protocol. No nutrition interventions warranted at this time. If nutrition issues arise, please consult RD.  ? ? ?Theone Stanley., MS, RD,  LDN (she/her/hers) ?RD pager number and weekend/on-call pager number located in [place]. ? ? ? ? ?

## 2021-12-07 ENCOUNTER — Encounter (HOSPITAL_COMMUNITY): Payer: Self-pay | Admitting: Cardiology

## 2021-12-07 NOTE — Anesthesia Postprocedure Evaluation (Signed)
Anesthesia Post Note ? ?Patient: Tommy Jimenez ? ?Procedure(s) Performed: TRANSESOPHAGEAL ECHOCARDIOGRAM (TEE) ?BUBBLE STUDY ? ?  ? ?Anesthesia Post Evaluation ?No notable events documented. ? ?Last Vitals:  ?Vitals:  ? 12/06/21 1430 12/06/21 1504  ?BP: 108/70 104/68  ?Pulse: 88 95  ?Resp: 18 16  ?Temp: (!) 36.3 ?C 36.8 ?C  ?SpO2: 100% 100%  ?  ?Last Pain:  ?Vitals:  ? 12/06/21 1504  ?TempSrc: Oral  ?PainSc:   ? ? ?  ?  ?  ?  ?  ?  ? ?Jarome Matin Doristine Shehan ? ? ? ? ?

## 2021-12-08 ENCOUNTER — Inpatient Hospital Stay: Payer: Self-pay | Admitting: Nurse Practitioner

## 2021-12-14 ENCOUNTER — Telehealth (HOSPITAL_COMMUNITY): Payer: Self-pay | Admitting: Pharmacist

## 2021-12-14 ENCOUNTER — Other Ambulatory Visit (HOSPITAL_COMMUNITY): Payer: Self-pay

## 2021-12-14 NOTE — Telephone Encounter (Signed)
Transitions of Care Pharmacy  ° °Call attempted for a pharmacy transitions of care follow-up. HIPAA appropriate voicemail was left with call back information provided.  ° °Call attempt #1. Will follow-up in 2-3 days.  °  °

## 2021-12-15 ENCOUNTER — Other Ambulatory Visit (HOSPITAL_COMMUNITY): Payer: Self-pay

## 2021-12-15 ENCOUNTER — Encounter (HOSPITAL_COMMUNITY): Payer: Self-pay

## 2021-12-15 ENCOUNTER — Telehealth (HOSPITAL_COMMUNITY): Payer: Self-pay | Admitting: *Deleted

## 2021-12-15 ENCOUNTER — Telehealth (HOSPITAL_COMMUNITY): Payer: Self-pay

## 2021-12-15 NOTE — Telephone Encounter (Signed)
Pharmacy Transitions of Care Follow-up Telephone Call ? ?Date of discharge: 12/06/21  ?Discharge Diagnosis: CVA ? ?How have you been since you were released from the hospital? Patient has been well since discharge, no questions about meds at this time.  ? ?Medication changes made at discharge: ?    START taking: ?clopidogrel (Plavix)  ?Eliquis (apixaban)  ?losartan (COZAAR)  ?metoprolol succinate (TOPROL-XL)  ?Pen Needles 3/16"  ?rosuvastatin (CRESTOR)  ?CHANGE how you take: ?NovoLIN 70/30 Kwikpen (insulin isophane & regular human KwikPen)  ?STOP taking: ?aspirin EC 81 MG tablet  ?atorvastatin 80 MG tablet (LIPITOR)  ?BRILINTA PO  ?glipiZIDE 10 MG 24 hr tablet (GLUCOTROL XL)  ?lisinopril 5 MG tablet (ZESTRIL)  ?metoprolol tartrate 25 MG tablet (LOPRESSOR)  ?tadalafil 10 MG tablet (Cialis)  ? ?Medication changes verified by the patient? Yes ?  ? ?Medication Accessibility: ? ?Home Pharmacy: [facility]  ? ?Was the patient provided with refills on discharged medications? Yes  ? ?Have all [facility] been [facility] to home pharmacy? Yes  ? ?Is the patient able to afford medications? New insurance will start on April 1st ?  ? ?Medication Review: ? ?APIXABAN (ELIQUIS)  ?Apixaban 5 mg BID initiated on 12/06/21 ?- Discussed importance of taking medication around the same time everyday  ?- Advised patient of medications to avoid (NSAIDs, ASA)  ?- Educated that Tylenol (acetaminophen) will be the preferred analgesic to prevent risk of bleeding  ?- Emphasized importance of monitoring for signs and symptoms of bleeding (abnormal bruising, prolonged bleeding, nose bleeds, bleeding from gums, discolored urine, black tarry stools)  ?- Advised patient to alert all providers of anticoagulation therapy prior to starting a new medication or having a procedure  ? ?CLOPIDOGREL (PLAVIX) ?Clopidogrel 75 mg once daily.  ?- Advised patient of medications to avoid (NSAIDs, ASA)  ?- Educated that Tylenol  (acetaminophen) will be the preferred analgesic to prevent risk of bleeding  ?- Emphasized importance of monitoring for signs and symptoms of bleeding (abnormal bruising, prolonged bleeding, nose bleeds, bleeding from gums, discolored urine, black tarry stools)  ?- Advised patient to alert all providers of anticoagulation therapy prior to starting a new medication or having a procedure  ? ?Follow-up Appointments: ? ?PCP Hospital f/u appt confirmed? Is talking to his PCP at the moment. Waiting until insurance kicks in t schedule a follow up ? ?[facility] f/u appt confirmed? Scheduled to see Dr. Marilynn Rail on 01/24/22 @ 10"30am.  ? ?If their condition worsens, is the pt aware to call PCP or go to the Emergency Dept.? Yes ? ?Final Patient Assessment: ?Patient has f/u scheduled and refills at home pharmacy ? ?

## 2021-12-15 NOTE — Telephone Encounter (Signed)
Call attempted to confirm HV TOC appt 12/15/21 @ 9am. HIPPA appropriate VM left with callback number.   ? ? ?Earnestine Leys, BSN, RN ?Heart Failure Nurse Navigator ?[phone]  ?

## 2021-12-15 NOTE — Progress Notes (Incomplete)
? ? ?HEART & VASCULAR TRANSITION OF CARE CONSULT NOTE  ? ? ? ?Referring Physician: Dr. Acie Fredrickson ?Primary Care: [name], PA-C ?Primary Cardiologist: Sanda Klein, MD ? ? ?HPI: ?Referred to clinic by Dr. Acie Fredrickson for heart failure consultation.  ? ?56 y/o male w/ history of ischemic heart disease, w/ history of OOH cardiac arrest in 05/2017 in setting of acute anterior STEMI. Was treated at [facility], where LHC showed totally occluded ostial LAD, treated w/ PCI + DES. Per records there was no other significant CAD. Echocardiogram at the time showed reduced LVEF, 30-35% and anteroapical AK. F/u echo 06/2017 showed slight improvement, EF up to 40%. It appears he has not had routien f/u w/ cardiology. Additional history includes IDDM, HTN and HLD.  ? ?He was recently admitted to [facility] 3/23 w/ rt sided numbness and weakness. 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  MR brain without contrast with small focus of acute ischemia in the right frontal cortex, no hemorrhage/mass effect, multiple old cerebellar infarcts and findings of small vessel ischemia that is chronic. MRA head/neck with no emergent large vessel occlusion or high-grade stenosis, moderate stenosis of the mid V2 segment of the left vertebral artery. 2D echo showed reduced LVEF. Cardiology was subsequently consulted and performed TTE, which showed severely reduced LVEF 20-25%, no LA thrombus. + Bubble study suggestive of PFO.  ? ? ? ?Cardiac Testing  ? ? ?Review of Systems: [y] = yes, '[ ]'$  = no  ? ?General: Weight gain '[ ]'$ ; Weight loss '[ ]'$ ; Anorexia '[ ]'$ ; Fatigue '[ ]'$ ; Fever '[ ]'$ ; Chills '[ ]'$ ; Weakness '[ ]'$   ?Cardiac: Chest pain/pressure '[ ]'$ ; Resting SOB '[ ]'$ ; Exertional SOB '[ ]'$ ; Orthopnea '[ ]'$ ; Pedal Edema '[ ]'$ ; Palpitations '[ ]'$ ; Syncope '[ ]'$ ; Presyncope '[ ]'$ ; Paroxysmal nocturnal dyspnea'[ ]'$   ?Pulmonary: Cough '[ ]'$ ; Wheezing'[ ]'$ ; Hemoptysis'[ ]'$ ; Sputum '[ ]'$ ; Snoring '[ ]'$   ?GI: Vomiting'[ ]'$ ; Dysphagia'[ ]'$ ; Melena[  ]; Hematochezia '[ ]'$ ; Heartburn'[ ]'$ ; Abdominal pain '[ ]'$ ; Constipation '[ ]'$ ; Diarrhea '[ ]'$ ; BRBPR '[ ]'$   ?GU: Hematuria'[ ]'$ ; Dysuria '[ ]'$ ; Nocturia'[ ]'$   ?Vascular: Pain in legs with walking '[ ]'$ ; Pain in feet with lying flat '[ ]'$ ; Non-healing sores '[ ]'$ ; Stroke '[ ]'$ ; TIA '[ ]'$ ; Slurred speech '[ ]'$ ;  ?Neuro: Headaches'[ ]'$ ; Vertigo'[ ]'$ ; Seizures'[ ]'$ ; Paresthesias'[ ]'$ ;Blurred vision '[ ]'$ ; Diplopia '[ ]'$ ; Vision changes '[ ]'$   ?Ortho/Skin: Arthritis '[ ]'$ ; Joint pain '[ ]'$ ; Muscle pain '[ ]'$ ; Joint swelling '[ ]'$ ; Back Pain '[ ]'$ ; Rash '[ ]'$   ?Psych: Depression'[ ]'$ ; Anxiety'[ ]'$   ?Heme: Bleeding problems '[ ]'$ ; Clotting disorders '[ ]'$ ; Anemia '[ ]'$   ?Endocrine: Diabetes '[ ]'$ ; Thyroid dysfunction'[ ]'$  ? ? ?Past Medical History:  ?Diagnosis Date  ? Coronary artery disease   ? stent placed  ? Dilated cardiomyopathy ([place])   ? DM (diabetes mellitus) ([place]) 1996  ? DVT (deep venous thrombosis) ([place])   ? right leg   ? Erectile dysfunction   ? H/O acute myocardial infarction   ? H/O heart artery stent   ? History of DVT (deep vein thrombosis)   ? HTN (hypertension)   ? Ischemic cardiomyopathy   ? Myocardial infarction [facility])   ? Obesity   ? Peripheral vascular disease ([place])   ? Pure hypercholesterolemia   ? ? ?Current Outpatient Medications  ?Medication Sig Dispense Refill  ? apixaban (ELIQUIS) 5 MG TABS tablet Take 1 tablet (5  mg total) by mouth 2 (two) times daily. 60 tablet 2  ? clopidogrel (PLAVIX) 75 MG tablet Take 1 tablet (75 mg total) by mouth daily. 30 tablet 2  ? insulin isophane & regular human KwikPen (NOVOLIN 70/30 KWIKPEN) (70-30) 100 UNIT/ML KwikPen Inject 22 Units into the skin 2 (two) times daily before a meal. 15 mL 2  ? Insulin Pen Needle (PEN NEEDLES 3/16") 31G X 5 MM MISC Use as directed with insulin pen 100 each 2  ? losartan (COZAAR) 25 MG tablet Take 1/2 tablet (12.5 mg total) by mouth daily. 15 tablet 2  ? metoprolol succinate (TOPROL-XL) 25 MG 24 hr tablet Take 1/2 tablet (12.5 mg total) by mouth daily. 15 tablet 2  ? rosuvastatin (CRESTOR) 40 MG  tablet Take 1 tablet (40 mg total) by mouth daily. 30 tablet 2  ? ?No current facility-administered medications for this visit.  ? ? ?No Known Allergies ? ?  ?Social History  ? ?Socioeconomic History  ? Marital status: Legally Separated  ?  Spouse name: Not on file  ? Number of children: Not on file  ? Years of education: Not on file  ? Highest education level: Not on file  ?Occupational History  ? Not on file  ?Tobacco Use  ? Smoking status: Former  ?  Years: 20.00  ?  Types: Cigarettes  ? Smokeless tobacco: Never  ?Vaping Use  ? Vaping Use: Never used  ?Substance and Sexual Activity  ? Alcohol use: Yes  ?  Comment: socially - beer  ? Drug use: No  ? Sexual activity: Not on file  ?Other Topics Concern  ? Not on file  ?Social History Narrative  ? Not on file  ? ?Social Determinants of Health  ? ?Financial Resource Strain: Not on file  ?Food Insecurity: Not on file  ?Transportation Needs: Not on file  ?Physical Activity: Not on file  ?Stress: Not on file  ?Social Connections: Not on file  ?Intimate Partner Violence: Not on file  ? ? ?  ?Family History  ?Problem Relation Age of Onset  ? Diabetes Mother   ?     complications of diabetes  ? Heart disease Father   ?     died of MI, died age 76yo  ? Hypertension Father   ? Multiple sclerosis Sister   ? Cancer Brother   ?     brain tumor  ? Diabetes Brother   ? Lupus Sister   ? Lupus Sister   ? Stroke Neg Hx   ? ? ?There were no vitals filed for this visit. ? ?PHYSICAL EXAM: ?General:  Well appearing. No respiratory difficulty ?HEENT: normal ?Neck: supple. no JVD. Carotids 2+ bilat; no bruits. No lymphadenopathy or thryomegaly appreciated. ?Cor: PMI nondisplaced. Regular rate & rhythm. No rubs, gallops or murmurs. ?Lungs: clear ?Abdomen: soft, nontender, nondistended. No hepatosplenomegaly. No bruits or masses. Good bowel sounds. ?Extremities: no cyanosis, clubbing, rash, edema ?Neuro: alert & oriented x 3, cranial nerves grossly intact. moves all 4 extremities w/o  difficulty. Affect pleasant. ? ?ECG: ? ? ?ASSESSMENT & PLAN: ? ?NYHA *** ?GDMT  ?Diuretic- ?BB- ?Ace/ARB/ARNI ?MRA ?SGLT2i ? ? ? ?Referred to HFSW (PCP, Medications, Transportation, ETOH Abuse, Drug Abuse, Insurance, Financial ): Yes or No ?Refer to Pharmacy: Yes or No ?Refer to Home Health: Yes on No ?Refer to Advanced Heart Failure Clinic: Yes or no  ?Refer to General Cardiology: Yes or No ? ?Follow up   ?

## 2021-12-17 ENCOUNTER — Encounter: Payer: Self-pay | Admitting: *Deleted

## 2021-12-17 ENCOUNTER — Other Ambulatory Visit: Payer: Self-pay | Admitting: *Deleted

## 2021-12-17 NOTE — Patient Outreach (Addendum)
[place] [facility]) Care Management ?Telephonic RN Care Manager Note ? ? ?12/17/2021 ?Name:  Oneill Bais MRN:  [id number] DOB:  Nov 18, 1965 ? ?Summary: ?Mr Kinsella returned a call to RN CM  ?He confirms some initial medication concerns were addressed on 12/15/21 by pharmacist- had medicines switched to [facility] ?He reports he is at work and has limited time to speak but voices potential out of pocket cost concern with his cardiac medicine ?He requests to outreach to RN CM later at the end of the day  ? ?Recommendations/Changes made from today's visit: ?Received pt incoming call ?Assessed for EMMI medicine questions ?Agreed to be available for a return call later in the day to attempt to assist with a cardiac medication concern ?Forward this note to cardiologist and 12/15/21 pharmacy staff E Mcmichael ? ?Subjective: ?Mishael Haran is an 56 y.o. year old male who is a primary patient of [name], [place]. The care management team was consulted for assistance with care management and/or care coordination needs.   ?Mr Goley was referred to [facility] on 12/17/21 for EMMI stroke red alert for his response during an automated outreach on 12/16/21 1000 - Questions/problems with meds? Yes  ?Mr Strubel had been discharged from [facility] hospital on 12/06/21 ? ?Telephonic RN Care Manager completed Telephone Visit today.  ? ?Objective: ? ?Medications Reviewed Today   ? ? Reviewed by Campbell Lerner, RN (Registered Nurse) on 12/06/21 at 1239  Med List Status: Complete  ? ?Medication Order Taking? Sig Documenting Provider Last Dose Status Informant  ?aspirin EC 81 MG tablet [id number] Yes Take 81 mg by mouth daily. [provider] Past Month Active Self  ?         ?Med Note (COFFELL, Dionne Bucy   Fri Dec 03, 2021  2:59 PM) Supposed to take daily but pt forgets to purchase  ?atorvastatin (LIPITOR) 80 MG tablet [id number] No Take 1 tablet (80 mg total) by mouth daily.  ?Patient not taking: Reported on  12/03/2021  ? Frederica Kuster, PA-C Not Taking Active Self  ?glipiZIDE (GLUCOTROL XL) 10 MG 24 hr tablet [id number] No Take 10 mg by mouth daily.  ?Patient not taking: Reported on 12/03/2021  ? [provider] Not Taking Active Self  ?Insulin Isophane & Regular Human (NOVOLIN 70/30 FLEXPEN) (70-30) 100 UNIT/ML PEN [id number] Yes Inject 35 Units into the skin 2 (two) times daily.  ?Patient taking differently: Inject 28 Units into the skin daily.  ? Frederica Kuster, PA-C Past Month Active Self  ?lisinopril (ZESTRIL) 5 MG tablet [id number] No Take 1 tablet (5 mg total) by mouth daily.  ?Patient not taking: Reported on 12/03/2021  ? Frederica Kuster, PA-C Not Taking Active Self  ?metoprolol tartrate (LOPRESSOR) 25 MG tablet [id number] Yes TAKE 1 & 1/2 (ONE & ONE-HALF) TABLETS BY MOUTH TWICE DAILY  ?Patient taking differently: Take 37.5 mg by mouth 2 (two) times daily.  Frederica Kuster, PA-C 12/02/2021 1600 Active Self  ?tadalafil (CIALIS) 10 MG tablet [id number] No 1/2-1 tablet prn  ?Patient not taking: Reported on 10/10/2018  ? Tysinger, Camelia Eng, PA-C Not Taking Active Self  ?Ticagrelor (BRILINTA PO) [id number] Yes Take 1 tablet by mouth daily. [provider] 12/02/2021 1600 Active Self  ?         ?Med Note (COFFELL, ANGELA M   Fri Dec 03, 2021  2:58 PM) Pt states he takes this medication but does not know the strength. Searched dispense history and med  hx companion and could not find.  ? ?  ?  ? ?  ? ? ? ?SDOH:  (Social Determinants of Health) assessments and interventions performed:  ? ? ?Care Plan ? ?Review of patient past medical history, allergies, medications, health status, including review of consultants reports, laboratory and other test data, was performed as part of comprehensive evaluation for care management services.  ? ?There are no care plans that you recently modified to display for this patient. ?  ? ?Plan: The patient has been provided with contact information for the care management team  and has been advised to call with any health related questions or concerns.  ?Pt to return a call to RN CM  ? ?Layton Naves L. Lavina Hamman, RN, BSN, CCM ?[place] Management Care Coordinator ?Office number [phone] ?[facility] number [phone] ?Fax number [phone] ? ? ? ? ? ?

## 2021-12-17 NOTE — Patient Outreach (Signed)
Received a red flag Emmi stroke notification ?I have assigned Joellyn Quails, RN to call for follow up and determine if there are any Case Management needs.  ?  ?Arville Care, CBCS, CMAA ?[place] Management Assistant ?[employer] Management ?[phone]   ?

## 2021-12-17 NOTE — Patient Outreach (Signed)
[place] [facility]) Care Management ?Telephonic RN Care Manager Note ? ? ?12/17/2021 ?Name:  Flora Ratz MRN:  [id number] DOB:  1966-05-26 ? ?Summary: ?[facility]N Unsuccessful outreach ?Outreach attempt to the listed at the preferred outreach number in [facility] [phone] ?No answer. THN RN CM left HIPAA [facility] Portability and Accountability Act) compliant voicemail message along with CM?s contact info.  ? ? ?Subjective: ?Torence Palmeri is an 56 y.o. year old male who is a primary patient of [name], [place]. The care management team was consulted for assistance with care management and/or care coordination needs.   ?Mr Torosyan was referred to [facility] on 12/17/21 for EMMI stroke red alert for his response during an automated outreach on 12/16/21 1000 - Questions/problems with meds? Yes  ?Mr Tony had been discharged from [facility] hospital on 12/06/21 ? ?Telephonic RN Care Manager completed Telephone Visit today.  ? ?Objective: ? ?Medications Reviewed Today   ? ? Reviewed by Campbell Lerner, RN (Registered Nurse) on 12/06/21 at 1239  Med List Status: Complete  ? ?Medication Order Taking? Sig Documenting Provider Last Dose Status Informant  ?aspirin EC 81 MG tablet [id number] Yes Take 81 mg by mouth daily. [provider] Past Month Active Self  ?         ?Med Note (COFFELL, Dionne Bucy   Fri Dec 03, 2021  2:59 PM) Supposed to take daily but pt forgets to purchase  ?atorvastatin (LIPITOR) 80 MG tablet [id number] No Take 1 tablet (80 mg total) by mouth daily.  ?Patient not taking: Reported on 12/03/2021  ? Frederica Kuster, PA-C Not Taking Active Self  ?glipiZIDE (GLUCOTROL XL) 10 MG 24 hr tablet [id number] No Take 10 mg by mouth daily.  ?Patient not taking: Reported on 12/03/2021  ? [provider] Not Taking Active Self  ?Insulin Isophane & Regular Human (NOVOLIN 70/30 FLEXPEN) (70-30) 100 UNIT/ML PEN [id number] Yes Inject 35 Units into the skin 2 (two) times daily.  ?Patient  taking differently: Inject 28 Units into the skin daily.  ? Frederica Kuster, PA-C Past Month Active Self  ?lisinopril (ZESTRIL) 5 MG tablet [id number] No Take 1 tablet (5 mg total) by mouth daily.  ?Patient not taking: Reported on 12/03/2021  ? Frederica Kuster, PA-C Not Taking Active Self  ?metoprolol tartrate (LOPRESSOR) 25 MG tablet [id number] Yes TAKE 1 & 1/2 (ONE & ONE-HALF) TABLETS BY MOUTH TWICE DAILY  ?Patient taking differently: Take 37.5 mg by mouth 2 (two) times daily.  Frederica Kuster, PA-C 12/02/2021 1600 Active Self  ?tadalafil (CIALIS) 10 MG tablet [id number] No 1/2-1 tablet prn  ?Patient not taking: Reported on 10/10/2018  ? Tysinger, Camelia Eng, PA-C Not Taking Active Self  ?Ticagrelor (BRILINTA PO) [id number] Yes Take 1 tablet by mouth daily. [provider] 12/02/2021 1600 Active Self  ?         ?Med Note (COFFELL, ANGELA M   Fri Dec 03, 2021  2:58 PM) Pt states he takes this medication but does not know the strength. Searched dispense history and med hx companion and could not find.  ? ?  ?  ? ?  ? ? ? ?SDOH:  (Social Determinants of Health) assessments and interventions performed:  ? ? ?Care Plan ? ?Review of patient past medical history, allergies, medications, health status, including review of consultants reports, laboratory and other test data, was performed as part of comprehensive evaluation for care management services.  ? ?There are no care plans  that you recently modified to display for this patient. ?  ?Plan: ?[facility] RN CM scheduled this patient for another call attempt within 4-7 business days ?Unsuccessful outreach letter sent on 12/17/21 ?Unsuccessful outreach on 12/17/21 ? ? ?Cilicia Borden L. Lavina Hamman, RN, BSN, CCM ?[place] Management Care Coordinator ?Office number [phone] ? ? ? ? ? ?

## 2021-12-20 ENCOUNTER — Other Ambulatory Visit: Payer: Self-pay | Admitting: *Deleted

## 2021-12-20 ENCOUNTER — Other Ambulatory Visit (HOSPITAL_COMMUNITY): Payer: Self-pay

## 2021-12-20 DIAGNOSIS — E669 Obesity, unspecified: Secondary | ICD-10-CM | POA: Insufficient documentation

## 2021-12-20 DIAGNOSIS — Z86718 Personal history of other venous thrombosis and embolism: Secondary | ICD-10-CM | POA: Insufficient documentation

## 2021-12-20 DIAGNOSIS — Z955 Presence of coronary angioplasty implant and graft: Secondary | ICD-10-CM | POA: Insufficient documentation

## 2021-12-20 DIAGNOSIS — I2510 Atherosclerotic heart disease of native coronary artery without angina pectoris: Secondary | ICD-10-CM | POA: Insufficient documentation

## 2021-12-20 DIAGNOSIS — E1165 Type 2 diabetes mellitus with hyperglycemia: Secondary | ICD-10-CM | POA: Insufficient documentation

## 2021-12-20 DIAGNOSIS — Z794 Long term (current) use of insulin: Secondary | ICD-10-CM | POA: Insufficient documentation

## 2021-12-20 NOTE — Patient Outreach (Addendum)
[place] [facility]) Care Management ?Telephonic RN Care Manager Note ? ? ?12/22/2021 ?Name:  Tommy Jimenez MRN:  [id number] DOB:  [birth year] ? ?Summary: ?Successful outreach to [facility] stroke alert patient after awaited a return call from him ? ?Mr Tommy Jimenez states he is doing well  ?He confirms cost concern with Eliquis even with the $10 co pay card for Eliquis provided at discharge.  ?His insurance was scheduled to become effective on 12/18/21 and he reports it has not become come effective ?Other discharge medicines were received along with Eliquis via the [facility] program from the [facility] transition of care pharmacy ? ?Mr Tommy Jimenez confirms he does not have computer access unless he goes to [employer] to view goodRx or medication assistance program application ? ?Confirms primary care provider (PCP) as Tommy Rout, PA ? ?Mr Tommy Jimenez provided verbal permission for RN CM to outreach to pcp office to inquire about assistance with Eliquis patient assistance program application  ? ?Recommendations/Changes made from today's visit: ?Further assessment for EMMI stroke medication concern ?Discussed medication patient assistance programs and goodrx  ?Discussed note sent to [facility]cp and [facility] transition [facility] care pharmacy staff with pending response ? ?Outreach to pcp office [phone] spoke with Hilda Blades ?Hilda Blades confirms the pcp office does not have a pharmacy staff member. Transferred to [facility] who confirms patient had not been seen in the office since March 2022 and had not followed up as recommended for hospital discharge.   ?The office staff will be able to assist him with the Eliquis patient assistance program.  ? ?Outreach to patient to follow up. No answer. THN RN CM left HIPAA [facility] Portability and Accountability Act) compliant voicemail message along with CM's contact info at the preferred number indicated in EPIC  ?He was encouraged to make a hospital follow up appointment with his pcp to proceed  with the Eliquis medication assistance program application  ?Pt returned a call RN CM reviewed importance of hospital follow up He states he will make an appointment today. The office number ws provided  ? ?RN CM outreached to [facility] transition of care pharmacist E McMichael via EPIC to inquire if the [facility] program assistance remains available for transitional assistance  ?Spoke with [facility] Transition of care  pharmacist in absence of McMichael to inquire about the use of the [facility] program. Since [name] was assisted to get all [facility] to his [place] his [facility] program services is not accessible. Tommy Jimenez for assisting ? ?Subjective: ?Tommy Jimenez is an 56 y.o. year old male who is a primary patient of Tommy Jimenez, [place]. The care management team was consulted for assistance with care management and/or care coordination needs.   ?[name] was referred to [facility] on 12/17/21 for EMMI stroke red alert for his response during an automated outreach on 12/16/21 1000 - Questions/problems with meds? Yes  ?[name] had been discharged from [facility] hospital on 12/06/21 ? ?Telephonic RN Care Manager completed Telephone Visit today.  ? ?Objective: ? ?Medications Reviewed Today   ? ? Reviewed by Tommy Pandy, PA-C (Physician Assistant) on 12/22/21 at [place] List Status: <None>  ? ?Medication Order Taking? Sig Documenting Provider Last Dose Status Informant  ?apixaban (ELIQUIS) 5 MG TABS tablet [id number] Yes Take 1 tablet (5 mg total) by mouth 2 (two) times daily. [place] Eric J, DO Taking Active   ?clopidogrel (PLAVIX) 75 MG tablet [id number] Yes Take 1 tablet (75  mg total) by mouth daily. [place] Donnamarie Poag, DO Taking Active   ?empagliflozin (JARDIANCE) 10 MG TABS tablet [id number] Yes Take 1 tablet (10 mg total) by mouth daily before breakfast. Tommy Pandy, PA-C  Active   ?insulin isophane & regular human KwikPen (NOVOLIN 70/30 KWIKPEN)  (70-30) 100 UNIT/ML KwikPen [id number] Yes Inject 22 Units into the skin 2 (two) times daily before a meal. [place] Donnamarie Poag, DO Taking Active   ?Insulin Pen Needle (PEN NEEDLES 3/16") 31G X 5 MM MISC [id number] Yes Use as directed with insulin pen [place] Donnamarie Poag, DO Taking Active   ?losartan (COZAAR) 25 MG tablet [id number] Yes Take 1/2 tablet (12.5 mg total) by mouth daily. [place] Donnamarie Poag, DO Taking Active   ?metoprolol succinate (TOPROL-XL) 25 MG 24 hr tablet [id number] Yes Take 1/2 tablet (12.5 mg total) by mouth daily. [place] Donnamarie Poag, DO Taking Active   ?rosuvastatin (CRESTOR) 40 MG tablet [id number] Yes Take 1 tablet (40 mg total) by mouth daily. [place] Eric J, DO Taking Active   ?spironolactone (ALDACTONE) 25 MG tablet [id number] Yes Take 0.5 tablets (12.5 mg total) by mouth daily. Tommy Pandy, PA-C  Active   ? ?  ?  ? ?  ? ? ? ?SDOH:  (Social Determinants of Health) assessments and interventions performed:  ?SDOH Interventions   ? ?Flowsheet Row Most Recent Value  ?SDOH Interventions   ?Financial Strain Interventions Other (Comment)  [Received MATCH medicine assist from [facility] at discharge, PCP to assist with Eliquis pt assistance application, provided goodrx 12/22/21 was connected with cardiac vascular SW for SDOH financial needs]  ?Housing Interventions Intervention Not Indicated  ?Intimate Partner Violence Interventions Intervention Not Indicated  ?Stress Interventions Intervention Not Indicated  ?Social Connections Interventions Intervention Not Indicated  ?Transportation Interventions Intervention Not Indicated  ? ?  ? ? ?Care Plan ? ?Review of patient past medical history, allergies, medications, health status, including review of consultants reports, laboratory and other test data, was performed as part of comprehensive evaluation for care management services.  ? ?Care Plan : RN Care Manager Plan of Care  ?Updates made by Barbaraann Faster, RN since 12/22/2021 12:00 AM  ?  ? ?Problem: Complex Care  Coordination Needs and disease management in patient with stroke   ?Priority: High  ?Onset Date: 12/20/2021  ?  ? ?Goal: Establish Plan of Care for Management Complex SDOH Barriers, disease management and Care Coordination Needs in patient with stroke   ?Start Date: 12/20/2021  ?Expected End Date: 01/14/2022  ?This Visit's Progress: On track  ?Priority: High  ?Note:   ?Current Barriers:  ?Knowledge Deficits related to plan of care for management of stroke  ?Care Coordination needs related to Medication procurement and Limited education about stroke* ?Difficulty obtaining medications ?Barriers- No insurance coverage/pending [employer] coverage per pt (12/18/21?), works full time, no computer access  ? ?RN CM Clinical Goal(s):  ?Patient will verbalize understanding of plan for management of stroke and medication procurement as evidenced by verbalization that medications received and teach back of stroke education  through collaboration with RN Care manager, provider, and care team.  ? ?Interventions: ?Outreaches for care coordination, disease management, resources, home care/education needs ?Inter-disciplinary care team collaboration (see longitudinal plan of care) ?Evaluation of current treatment plan related to  self management and patient's adherence to plan as established by provider ? ? ?Stroke:  (Status:New goal.) Short Term Goal ?Reviewed Importance of taking all medications as prescribed ?Reviewed Importance of attending all scheduled provider appointments ?Screening  for signs and symptoms of depression related to chronic disease state ?Assessed social determinant of health barriers ?Assessed for signs and symptoms of stroke ?12/20/21 Outreach to pcp office [phone] spoke with Hilda Blades ?Hilda Blades confirms the pcp office does not have a pharmacy staff member. Transferred to [facility] who confirms patient had not been seen in the office since March 2022 and had not followed up as recommended for hospital  discharge.   ?The office staff will be able to assist him with the Eliquis patient assistance program. Updated pt via voice message & when he returned a call.  He was encouraged to make a hospital follow up ap

## 2021-12-22 ENCOUNTER — Other Ambulatory Visit (HOSPITAL_COMMUNITY): Payer: Self-pay

## 2021-12-22 ENCOUNTER — Encounter (HOSPITAL_COMMUNITY): Payer: Self-pay

## 2021-12-22 ENCOUNTER — Telehealth (HOSPITAL_COMMUNITY): Payer: Self-pay | Admitting: *Deleted

## 2021-12-22 ENCOUNTER — Other Ambulatory Visit: Payer: Self-pay | Admitting: *Deleted

## 2021-12-22 ENCOUNTER — Encounter: Payer: Self-pay | Admitting: *Deleted

## 2021-12-22 ENCOUNTER — Ambulatory Visit (HOSPITAL_COMMUNITY)
Admission: RE | Admit: 2021-12-22 | Discharge: 2021-12-22 | Disposition: A | Discharge status: Home / Self Care | Payer: Self-pay | Source: Ambulatory Visit | Attending: Cardiology | Admitting: Cardiology

## 2021-12-22 VITALS — BP 139/73 | HR 88 | Wt 144.0 lb

## 2021-12-22 DIAGNOSIS — F17210 Nicotine dependence, cigarettes, uncomplicated: Secondary | ICD-10-CM | POA: Insufficient documentation

## 2021-12-22 DIAGNOSIS — Z7901 Long term (current) use of anticoagulants: Secondary | ICD-10-CM | POA: Insufficient documentation

## 2021-12-22 DIAGNOSIS — Z86718 Personal history of other venous thrombosis and embolism: Secondary | ICD-10-CM

## 2021-12-22 DIAGNOSIS — I2510 Atherosclerotic heart disease of native coronary artery without angina pectoris: Secondary | ICD-10-CM | POA: Insufficient documentation

## 2021-12-22 DIAGNOSIS — Z7984 Long term (current) use of oral hypoglycemic drugs: Secondary | ICD-10-CM | POA: Insufficient documentation

## 2021-12-22 DIAGNOSIS — I739 Peripheral vascular disease, unspecified: Secondary | ICD-10-CM

## 2021-12-22 DIAGNOSIS — E785 Hyperlipidemia, unspecified: Secondary | ICD-10-CM | POA: Insufficient documentation

## 2021-12-22 DIAGNOSIS — Q2112 Patent foramen ovale: Secondary | ICD-10-CM | POA: Insufficient documentation

## 2021-12-22 DIAGNOSIS — I6389 Other cerebral infarction: Secondary | ICD-10-CM | POA: Insufficient documentation

## 2021-12-22 DIAGNOSIS — Z794 Long term (current) use of insulin: Secondary | ICD-10-CM | POA: Insufficient documentation

## 2021-12-22 DIAGNOSIS — I5022 Chronic systolic (congestive) heart failure: Secondary | ICD-10-CM

## 2021-12-22 DIAGNOSIS — Z79899 Other long term (current) drug therapy: Secondary | ICD-10-CM | POA: Insufficient documentation

## 2021-12-22 DIAGNOSIS — E119 Type 2 diabetes mellitus without complications: Secondary | ICD-10-CM | POA: Insufficient documentation

## 2021-12-22 DIAGNOSIS — Z8674 Personal history of sudden cardiac arrest: Secondary | ICD-10-CM | POA: Insufficient documentation

## 2021-12-22 DIAGNOSIS — I110 Hypertensive heart disease with heart failure: Secondary | ICD-10-CM | POA: Insufficient documentation

## 2021-12-22 DIAGNOSIS — Z955 Presence of coronary angioplasty implant and graft: Secondary | ICD-10-CM | POA: Insufficient documentation

## 2021-12-22 DIAGNOSIS — Z7982 Long term (current) use of aspirin: Secondary | ICD-10-CM | POA: Insufficient documentation

## 2021-12-22 LAB — BASIC METABOLIC PANEL
Anion gap: 5 (ref 5–15)
BUN: 12 mg/dL (ref 6–20)
CO2: 27 mmol/L (ref 22–32)
Calcium: 9.2 mg/dL (ref 8.9–10.3)
Chloride: 108 mmol/L (ref 98–111)
Creatinine, Ser: 0.88 mg/dL (ref 0.61–1.24)
GFR, Estimated: >60 mL/min (ref >60)
Glucose, Bld: 162 mg/dL — ABNORMAL HIGH (ref 70–99)
Potassium: 4.4 mmol/L (ref 3.5–5.1)
Sodium: 140 mmol/L (ref 135–145)

## 2021-12-22 MED ORDER — SPIRONOLACTONE 25 MG PO TABS: 12.5000 mg | ORAL_TABLET | Freq: Every day | ORAL | 3 refills | Status: DC

## 2021-12-22 MED ORDER — EMPAGLIFLOZIN 10 MG PO TABS
10.0000 mg | ORAL_TABLET | Freq: Every day | ORAL | 11 refills | Status: DC
  Filled 2021-12-22: qty 30, 30d supply, fill #0

## 2021-12-22 NOTE — Progress Notes (Signed)
?Heart and Vascular Care Navigation ? ?12/22/2021 ? ?Rahn Lacuesta ?01/21/1966 ?[id number] ? ?Reason for Referral: CSW met with patient in the [facility] to complete SDoH screen. ?  ?Engaged with patient face to face for initial visit for Heart and Vascular Care Coordination. ?                                                                                                  ?Assessment:   Patient is a 56 yo male who lives with a friend. He states that he works full time at [employer] and his insurance just started with [employer] beginning April 1 , 2023.  Patient denies any SDoH needs at tis time. He did report that he struggles with financial needs at times but manages to make ends meet with his budgeting.                                 ? ?HRT/VAS Care Coordination   ? ? Patients Home Cardiology Office Heart Failure Clinic  HF TOC  ? Outpatient Care Team Social Worker  ? Social Worker Name: Raquel Sarna, [place] [phone]  ? Living arrangements for the past 2 months Single Family Home  ? Lives with: Friends  ? Patient Current Insurance Coverage --  [employer] Plan  ? Patient Has Concern With Paying Medical Bills Yes  ? Does Patient Have Prescription Coverage? Yes  ? Home Assistive Devices/Equipment None  ? ?  ? ? ?Social History:                                                                             ?SDOH Screenings  ? ?Alcohol Screen: Not on file  ?Depression (PHQ2-9): Not on file  ?Financial Resource Strain: Medium Risk  ? Difficulty of Paying Living Expenses: Somewhat hard  ?Food Insecurity: No Food Insecurity  ? Worried About Charity fundraiser in the Last Year: Never true  ? Ran Out of Food in the Last Year: Never true  ?Housing: Low Risk   ? Last Housing Risk Score: 0  ?Physical Activity: Not on file  ?Social Connections: Not on file  ?Stress: Not on file  ?Tobacco Use: Medium Risk  ? Smoking Tobacco Use: Former  ? Smokeless Tobacco Use: Never  ? Passive Exposure: Not on file   ?Transportation Needs: No Transportation Needs  ? Lack of Transportation (Medical): No  ? Lack of Transportation (Non-Medical): No  ? ? ?SDOH Interventions: ?Financial Resources:  Financial Strain Interventions: Intervention Not Indicated ?   ?Food Insecurity:  Food Insecurity Interventions: Intervention Not Indicated  ?Housing Insecurity:  Housing Interventions: Intervention Not Indicated  ?Transportation:   Transportation Interventions: Intervention Not Indicated  ? ?Follow-up plan:  CSW provided supportive intervention and  briefly discussed some options for assistance with medications if needed.  Patient will follow up in HF clinic and CSW available as needed. No further needs at this time. Raquel Sarna, [place], CCSW-MCS [phone] ? ? ? ? ? ?

## 2021-12-22 NOTE — Progress Notes (Signed)
Medication Samples have been provided to the patient. ? ?Drug name: Eliquis       Strength: '5mg'$         Qty: 4 boxes  LOT: [id number]  Exp.Date: 02/25 ? ?Dosing instructions: Take '5mg'$  Twice daily ? ? ?The patient has been instructed regarding the correct time, dose, and frequency of taking this medication, including desired effects and most common side effects.  ? ?Sharif Rendell M Adan Beal ?11:45 AM ?12/22/2021 ? ?

## 2021-12-22 NOTE — Telephone Encounter (Signed)
Called to confirm Heart & Vascular Transitions of Care appointment at 10 am on 12/22/21. Patient reminded to bring all medications and pill box organizer with them. Confirmed patient has transportation. Gave directions, instructed to utilize [facility] parking. ? ?Confirmed appointment prior to ending call.   ? ?Earnestine Leys, BSN, RN ?Heart Failure Nurse Navigator ?[phone]  ?

## 2021-12-22 NOTE — Patient Outreach (Addendum)
[facility]) Care Management ?Telephonic RN Care Manager Note ? ? ?12/22/2021 ?Name:  Tommy Jimenez MRN:  [id number] DOB:  1965-12-04 ? ?Summary: ?Unsuccessful outreach  ?No answer at [address] [place] CM left HIPAA ([place] and Accountability Act) compliant voicemail message along with CM's contact info at the preferred number indicated in EPIC  ? Included I message an updated the [place] transition pharmacy services unable to assist with Eliquis ?Encourage follow appointment scheduling with pcp for Eliquis patient assistance program application  ? ? ?Subjective: ?Tommy Jimenez is an 56 y.o. year old male who is a primary patient of [name], [place]. The care management team was consulted for assistance with care management and/or care coordination needs.   ?Tommy Jimenez was referred to [facility] on 12/17/21 for EMMI stroke red alert for his response during an automated outreach on 12/16/21 1000 - Questions/problems with meds? Yes  ?Tommy Jimenez had been discharged from [facility] hospital on 12/06/21+ ? ?Telephonic RN Care Manager completed Telephone Visit today.  ? ?Objective: ? ?Medications Reviewed Today   ? ? Reviewed by Consuelo Pandy, PA-C (Physician Assistant) on 12/22/21 at [place] List Status: <None>  ? ?Medication Order Taking? Sig Documenting Provider Last Dose Status Informant  ?apixaban (ELIQUIS) 5 MG TABS tablet [id number] Yes Take 1 tablet (5 mg total) by mouth 2 (two) times daily. [place] Eric J, DO Taking Active   ?clopidogrel (PLAVIX) 75 MG tablet [id number] Yes Take 1 tablet (75 mg total) by mouth daily. [place] Donnamarie Poag, DO Taking Active   ?empagliflozin (JARDIANCE) 10 MG TABS tablet [id number] Yes Take 1 tablet (10 mg total) by mouth daily before breakfast. Consuelo Pandy, PA-C  Active   ?insulin isophane & regular human KwikPen (NOVOLIN 70/30 KWIKPEN) (70-30) 100 UNIT/ML KwikPen [id number] Yes Inject 22 Units into the skin 2 (two) times  daily before a meal. [place] Donnamarie Poag, DO Taking Active   ?Insulin Pen Needle (PEN NEEDLES 3/16") 31G X 5 MM MISC [id number] Yes Use as directed with insulin pen [place] Donnamarie Poag, DO Taking Active   ?losartan (COZAAR) 25 MG tablet [id number] Yes Take 1/2 tablet (12.5 mg total) by mouth daily. [place] Donnamarie Poag, DO Taking Active   ?metoprolol succinate (TOPROL-XL) 25 MG 24 hr tablet [id number] Yes Take 1/2 tablet (12.5 mg total) by mouth daily. [place] Donnamarie Poag, DO Taking Active   ?rosuvastatin (CRESTOR) 40 MG tablet [id number] Yes Take 1 tablet (40 mg total) by mouth daily. [place] Eric J, DO Taking Active   ?spironolactone (ALDACTONE) 25 MG tablet [id number] Yes Take 0.5 tablets (12.5 mg total) by mouth daily. Consuelo Pandy, PA-C  Active   ? ?  ?  ? ?  ? ? ? ?SDOH:  (Social Determinants of Health) assessments and interventions performed:  ? ? ?Care Plan ? ?Review of patient past medical history, allergies, medications, health status, including review of consultants reports, laboratory and other test data, was performed as part of comprehensive evaluation for care management services.  ? ?Care Plan : RN Care Manager Plan of Care  ?Updates made by Barbaraann Faster, RN since 12/22/2021 12:00 AM  ?  ? ?Problem: Complex Care Coordination Needs and disease management in patient with stroke   ?Priority: High  ?Onset Date: 12/20/2021  ?  ? ?Goal: Establish Plan of Care for Management Complex SDOH Barriers, disease management and Care Coordination Needs in patient with stroke   ?Start Date: 12/20/2021  ?Expected End  Date: 01/14/2022  ?This Visit's Progress: On track  ?Priority: High  ?Note:   ?Current Barriers:  ?Knowledge Deficits related to plan of care for management of stroke  ?Care Coordination needs related to Medication procurement and Limited education about stroke* ?Difficulty obtaining medications ?Barriers- No insurance coverage/pending Friday Health coverage per pt (12/18/21?), works full time, no computer access  ? ?RN  CM Clinical Goal(s):  ?Patient will verbalize understanding of plan for management of stroke and medication procurement as evidenced by verbalization that medications received and teach back of stroke education  through collaboration with RN Care manager, provider, and care team.  ? ?Interventions: ?Outreaches for care coordination, disease management, resources, home care/education needs ?Inter-disciplinary care team collaboration (see longitudinal plan of care) ?Evaluation of current treatment plan related to  self management and patient's adherence to plan as established by provider ? ? ?Stroke:  (Status:New goal.) Short Term Goal ?Reviewed Importance of taking all medications as prescribed ?Reviewed Importance of attending all scheduled provider appointments ?Screening for signs and symptoms of depression related to chronic disease state ?Assessed social determinant of health barriers ?Assessed for signs and symptoms of stroke ?12/20/21 Outreach to pcp office [phone] spoke with Hilda Blades ?Hilda Blades confirms the pcp office does not have a pharmacy staff member. Transferred to [facility] who confirms patient had not been seen in the office since March 2022 and had not followed up as recommended for hospital discharge.   ?The office staff will be able to assist him with the Eliquis patient assistance program. Updated pt via voice message & when he returned a call.  He was encouraged to make a hospital follow up appointment with his pcp to proceed with the Eliquis medication assistance program application ?RN CM outreached to [facility] transition of care pharmacist E McMichael via EPIC to inquire if the [facility] program assistance remains available for transitional assistance  ?Spoke with Jun[facility]er Transition of care  pharmacist in absence of McMichael to inquire about the use of the [facility] program. Since Tommy Jimenez was assisted to [facility] his [place] his [facility]  program services is not accessible. Wandra Scot for assisting ?12/22/21 Received MATCH medicine assist from [facility] at discharge, PCP to assist with Eliquis pt assistance application, provided goodrx 12/22/21 was connected with cardiac vascular SW for SDOH financial needs  ? ?Interdisciplinary Collaboration Interventions:  (Status: New goal. and Goal on track:  Yes.) Short Term Goal   ?Collaborated with BSW to initiate plan of care to address needs related to Financial constraints related to medications, no insurance  in patient with  stroke ?12/22/21 Received MATCH medicine assist from [facility] at discharge, PCP to assist with Eliquis pt assistance application, provided goodrx 12/22/21 was connected with cardiac vascular SW for SDOH financial needs   ? ? ?Patient Goals/Self-Care Activities: ?Take all medications as prescribed ?Attend all scheduled provider appointments ?Call pharmacy for medication refills 3-7 days in advance of running out of medications ?Perform all self care activities independently  ?Perform IADL's (shopping, preparing meals, housekeeping, managing finances) independently ?Call provider office for new concerns or questions  ? ?Follow Up Plan:  The patient has been provided with contact information for the care management team and has been advised to call with any health related questions or concerns.  ?The care management team will reach out to the patient again over the next 30 business days. ? ?  ?  ? ?Plan: The patient has been provided  with contact information for the care management team and has been advised to call with any health related questions or concerns.  ?The care management team will reach out to the patient again over the next 14+ business days. ? ?Jamara Vary L. Lavina Hamman, RN, BSN, CCM ?[place] Management Care Coordinator ?Office number [phone] ?[facility] number [phone] ?Fax number [phone] ? ? ? ? ? ?

## 2021-12-22 NOTE — Patient Instructions (Signed)
Medication Changes: ? ?Start Jardinace '10mg'$  daily ? ?Lab Work: ? ?Labs done today, your results will be available in MyChart, we will contact you for abnormal readings. ? ? ?Testing/Procedures: ? ? ?Your physician has requested that you have a lower or upper extremity Arterial duplex. This test is an ultrasound of the arteries in the legs or arms. It looks at venous blood flow that carries blood from the heart to the legs or arms. Allow one hour for a Lower Venous exam. Allow thirty minutes for an Upper Venous exam. There are no restrictions or special instructions. You will be called to scheduled the appointment ? ? ?Repeat blood work in 10-14 days ? ?Referrals: ? ?none ? ?Special Instructions // Education: ? ?none ? ?Follow-Up in: 4 weeks  ? ?At the [facility], you and your health needs are our priority. We have a designated team specialized in the treatment of Heart Failure. This Care Team includes your primary Heart Failure Specialized Cardiologist (physician), Advanced Practice Providers (APPs- Physician Assistants and Nurse Practitioners), and Pharmacist who all work together to provide you with the care you need, when you need it.  ? ?You may see any of the following providers on your designated Care Team at your next follow up: ? ?Dr Glori Bickers ?Dr Loralie Champagne ?Darrick Grinder, NP ?Lyda Jester, PA ?Jessica Milford,NP ?Marlyce Huge, PA ?Audry Riles, PharmD ? ? ?Please be sure to bring in all your medications bottles to every appointment.  ? ?Need to Contact us: ? ?If you have any questions or concerns before your next appointment please send [place] a message through [place] or call our office at [phone].   ? ?TO LEAVE A MESSAGE FOR THE NURSE SELECT OPTION 2, PLEASE LEAVE A MESSAGE INCLUDING: ?YOUR NAME ?DATE OF BIRTH ?CALL BACK NUMBER ?REASON FOR CALL**this is important as we prioritize the call backs ? ?YOU WILL RECEIVE A CALL BACK THE SAME DAY AS LONG AS YOU CALL BEFORE 4:00  PM ? ? ?

## 2021-12-22 NOTE — Progress Notes (Signed)
Patient ID: Tommy Jimenez, male   DOB: [birth year], 56 y.o.   MRN: [id number] ?Patient enrolled for [employer] to ship a 30 day cardiac event monitor to his address on file.  ? ?Letter with instructions and self pay discount program information mailed to patient. ? ?Dr. Sallyanne Kuster to read. ?

## 2021-12-22 NOTE — Progress Notes (Signed)
ReDS Vest / Clip - 12/22/21 1000   ? ?  ? ReDS Vest / Clip  ? Station Marker C   ? Ruler Value 22   ? ReDS Value Range Moderate volume overload   ? ReDS Actual Value 37   ? ?  ?  ? ?  ? ? ?

## 2021-12-22 NOTE — Progress Notes (Signed)
? ? ?HEART & VASCULAR TRANSITION OF CARE CONSULT NOTE  ? ? ? ?Referring Physician: Dr. Acie Fredrickson ?Primary Care: [name], PA-C ?Primary Cardiologist: Sanda Klein, MD ? ? ?HPI: ?Referred to clinic by Dr. Acie Fredrickson for heart failure consultation.  ? ?56 y/o male w/ history of ischemic heart disease, w/ history of OOH cardiac arrest in 05/2017 in setting of acute anterior STEMI. Was treated at [facility], where LHC showed totally occluded ostial LAD, treated w/ PCI + DES. Per records there was no other significant CAD. Echocardiogram at the time showed reduced LVEF, 30-35%, and anteroapical AK. F/u echo 06/2017 showed slight improvement, EF up to 40%. It appears he has not had routien f/u w/ cardiology. Additional history includes IDDM, HTN, HLD and tobacco abuse.  ? ?He was recently admitted to [facility] 3/23 w/ rt sided numbness and weakness. 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  MR brain without contrast with small focus of acute ischemia in the right frontal cortex, no hemorrhage/mass effect, multiple old cerebellar infarcts and findings of small vessel ischemia that is chronic. MRA head/neck with no emergent large vessel occlusion or high-grade stenosis, moderate stenosis of the mid V2 segment of the left vertebral artery. 2D echo showed reduced LVEF, 20-25%, RV normal. Cardiology was subsequently consulted and performed TEE, which showed severely reduced LVEF 20-25%, no LA thrombus. + Bubble study suggestive of PFO. Hgb A1c 8.3, LDL 148. Neurology recommended a/c w/ Eliquis and 30 day monitor to assess for possible Afib (arranged by cardiology). Referred to [facility] clinic.  ? ?He presents today for f/u. Reports doing fairly well. No residual neurological deficits. RUE weakness resolved. Denies CP but complains of exertional dyspnea, NYHA Class II. Also notes b/l LE calf claudication. Former smoker x 20 + years but quit after his recent hospitalization. He  reports full med compliance, tolerating well w/o side effects. BP 139/73. He is on insulin and checks daily CBGs at home. His lowest glucose reading post hospital has been in the 120s. EKG shows NSR 86 bpm. No ST abnormalties. ReDs clip mildly elevated at 37%.  ? ?He works as a Astronomer at [employer]  ? ?Family history: father CAD died from MI at age 47. Mother and Brother w/ CKD, brother s/p recent renal transplant. Sister w/ PVD   ?  ? ?Cardiac Testing  ? ?LHC 05/30/2017 [facility]  ?Angiographic Findings ?Cardiac Arteries and Lesion Findings ?LMCA: 0%. ?LAD: ?Lesion on Prox LAD: Ostial.100% stenosis 8 mm length reduced to 0%. Pre ?procedure TIMI 0 flow was noted. Post Procedure TIMI III flow was present. ?Poor run off was present. The lesion was diagnosed as High Risk (C). ?Devices used ?- Abbott 0.014" x 190cm BMW J-Tip PTCI Guidewire. Number of passes: 1. ?- 2.5 mm X 12 mm Trek RX. 1 inflation(s) to a max pressure of: 8 atm. ?- 3.5 mm x 12 mm Xience Sierra DES Stent. 1 inflation(s) to a max ?pressure of: 12 atm. ?- [place] Trek 3.5 mm X 12 mm. 1 inflation(s) to a max pressure of: 12 atm. ?LCx: 0%. ?RCA: 0%. ? ? ? ?2D Echo 12/04/21 ? ?left ventricular ejection fraction, by estimation, is 20 to 25%. The left ventricle has ?severely decreased function. The left ventricle demonstrates global hypokinesis. The ?left ventricular internal cavity size was mildly dilated. There is moderate left ?ventricular hypertrophy. Left ventricular diastolic parameters are indeterminate. ?1. ?2. Right ventricular systolic function is normal. The right ventricular size is  normal. ?3. Left atrial size was moderately dilated. ?4. No evidence of mitral valve regurgitation. ?5. Aortic valve regurgitation is not visualized. ? ? ?TEE 12/06/21 ?left ventricular ejection fraction, by estimation, is 20 to 25%. The left ventricle has ?severely decreased function. The left ventricular internal cavity size was mildly ?dilated. ?1. ?2. Right  ventricular systolic function is normal. The right ventricular size is normal. ?Left atrial size was moderately dilated. No left atrial/left atrial appendage thrombus ?was detected. ?3. ?4. The mitral valve is normal in structure. Trivial mitral valve regurgitation. ?The aortic valve is tricuspid. Aortic valve regurgitation is not visualized. No aortic ?stenosis is present. ?5. ?Agitated saline contrast bubble study was positive with shunting observed within 3-6 ?cardiac cycles suggestive of interatrial shunt. ? ? ? ?Review of Systems: [y] = yes, [ ] = no  ? ?General: Weight gain [ ]; Weight loss [ ]; Anorexia [ ]; Fatigue [ ]; Fever [ ]; Chills [ ]; Weakness [ ]  ?Cardiac: Chest pain/pressure [ ]; Resting SOB [ ]; Exertional SOB [Y ]; Orthopnea [ ]; Pedal Edema [ ]; Palpitations [ ]; Syncope [ ]; Presyncope [ ]; Paroxysmal nocturnal dyspnea[ ]  ?Pulmonary: Cough [ ]; Wheezing[ ]; Hemoptysis[ ]; Sputum [ ]; Snoring [ ]  ?GI: Vomiting[ ]; Dysphagia[ ]; Melena[ ]; Hematochezia [ ]; Heartburn[ ]; Abdominal pain [ ]; Constipation [ ]; Diarrhea [ ]; BRBPR [ ]  ?GU: Hematuria[ ]; Dysuria [ ]; Nocturia[ ]  ?Vascular: Pain in legs with walking [Y ]; Pain in feet with lying flat [ ]; Non-healing sores [ ]; Stroke Tommy Jimenez ]; TIA [ ]; Slurred speech [ ];  ?Neuro: Headaches[ ]; Vertigo[ ]; Seizures[ ]; Paresthesias[ ];Blurred vision [ ]; Diplopia [ ]; Vision changes [ ]  ?Ortho/Skin: Arthritis [ ]; Joint pain [ ]; Muscle pain [ ]; Joint swelling [ ]; Back Pain [ ]; Rash [ ]  ?Psych: Depression[ ]; Anxiety[ ]  ?Heme: Bleeding problems [ ]; Clotting disorders [ ]; Anemia [ ]  ?Endocrine: Diabetes [ Y]; Thyroid dysfunction[ ] ? ? ?Past Medical History:  ?Diagnosis Date  ? Coronary artery disease   ? stent placed  ? Dilated cardiomyopathy ([place])   ? DM (diabetes mellitus) ([place]) 1996  ? DVT (deep venous thrombosis) ([place])   ? right leg   ? Erectile dysfunction   ? H/O acute myocardial infarction   ? H/O heart artery stent   ? History of  DVT (deep vein thrombosis)   ? HTN (hypertension)   ? Ischemic cardiomyopathy   ? Myocardial infarction [facility])   ? Obesity   ? Peripheral vascular disease ([place])   ? Pure hypercholesterolemia   ? ? ?Current Outpatient Medications  ?Medication Sig Dispense Refill  ? apixaban (ELIQUIS) 5 MG TABS tablet Take 1 tablet (5 mg total) by mouth 2 (two) times daily. 60 tablet 2  ? clopidogrel (PLAVIX) 75 MG tablet Take 1 tablet (75 mg total) by mouth daily. 30 tablet 2  ? insulin isophane & regular human KwikPen (NOVOLIN 70/30 KWIKPEN) (70-30) 100 UNIT/ML KwikPen Inject 22 Units into the skin 2 (two) times daily before a meal. 15 mL 2  ? Insulin Pen Needle (PEN NEEDLES 3/16") 31G X 5 MM MISC Use as directed with insulin pen 100 each 2  ? losartan (COZAAR) 25 MG tablet Take 1/2 tablet (12.5 mg total) by mouth daily. 15 tablet 2  ? metoprolol succinate (TOPROL-XL) 25 MG 24 hr tablet Take 1/2 tablet (12.5 mg total)  by mouth daily. 15 tablet 2  ? rosuvastatin (CRESTOR) 40 MG tablet Take 1 tablet (40 mg total) by mouth daily. 30 tablet 2  ? ?No current facility-administered medications for this encounter.  ? ? ?No Known Allergies ? ?  ?Social History  ? ?Socioeconomic History  ? Marital status: Legally Separated  ?  Spouse name: Not on file  ? Number of children: Not on file  ? Years of education: Not on file  ? Highest education level: Not on file  ?Occupational History  ? Not on file  ?Tobacco Use  ? Smoking status: Former  ?  Years: 20.00  ?  Types: Cigarettes  ? Smokeless tobacco: Never  ?Vaping Use  ? Vaping Use: Never used  ?Substance and Sexual Activity  ? Alcohol use: Yes  ?  Comment: socially - beer  ? Drug use: No  ? Sexual activity: Not on file  ?Other Topics Concern  ? Not on file  ?Social History Narrative  ? Not on file  ? ?Social Determinants of Health  ? ?Financial Resource Strain: Not on file  ?Food Insecurity: Not on file  ?Transportation Needs: Not on file  ?Physical Activity: Not on file  ?Stress: Not on file   ?Social Connections: Not on file  ?Intimate Partner Violence: Not on file  ? ? ?  ?Family History  ?Problem Relation Age of Onset  ? Diabetes Mother   ?     complications of diabetes  ? Heart disease Father

## 2021-12-23 ENCOUNTER — Telehealth: Payer: Self-pay

## 2021-12-23 NOTE — Telephone Encounter (Signed)
Scheduled the patient 4/13 for PFO consult with Dr. Ali Lowe.  ?He understands a plan likely will not be confirmed until heart monitor is complete, but will go ahead with consult. ?He was grateful for call and agrees with plan.  ?

## 2021-12-23 NOTE — Telephone Encounter (Signed)
-----   Message from Consuelo Pandy, [place] sent at 12/23/2021 11:00 AM EDT ----- ?Thank you!  ? ?----- Message ----- ?From: Early Osmond, MD ?Sent: 12/22/2021   3:06 PM EDT ?To: Sherren Mocha, MD, Consuelo Pandy, PA-C, # ? ?Yes we do see these patients and do close PFOs.  Joellen Jersey can get him in to see one of [place].  Thank you! ?----- Message ----- ?From: Consuelo Pandy, PA-C ?Sent: 12/22/2021   2:26 PM EDT ?To: Sherren Mocha, MD, Early Osmond, MD ? ?Hey, I saw this patient in [facility] clinic today. He had recent CVA and TEE showed PFO but notes don't really outline plan for needed treatment. Do you guys typically see these patients? Is closure device indicated?  ? ? ?

## 2021-12-29 ENCOUNTER — Other Ambulatory Visit: Payer: Self-pay | Admitting: *Deleted

## 2021-12-29 NOTE — Patient Outreach (Signed)
[facility]) Care Management ?Telephonic RN Care Manager Note ? ? ?01/03/2022 ?Name:  Tommy Jimenez MRN:  [id number] DOB:  1966/06/28 ? ?Summary: ?Successful outreach for follow up with EMMI case closure  ?Tommy Jimenez confirms he is doing better He is out completing errands ?He confirms he has obtained a copy of his new Friday insurance coverage ?He has picked on several medicines at no cost but did not inquire about the cost of the Eliquis  ?He confirms he is following up with the cardiologist on 4/13/2 ?He agrees he is able to afford Eliquis for $15/60 tabs when he and RN CM completed a conference call to Add[place] at [place] ?He agrees to [facility] EMMI case closure and reports he will outreach prn to RN CM  ? ? ?Recommendations/Changes made from today's visit: ?Assessed for follow up on medication and appointment needs ?Encouraged him to make a hospital follow up with his pcp and neurologist. Discussed the importance of these appointments and pcp services ? ?Spoke with [place] at [facility] on [address] to confirm Tommy Jimenez new Friday insurance coverage ?Discussed case closure of EMMI stroke as needs have been addressed and resolved  ? ?Subjective: ?Tommy Jimenez is an 56 y.o. year old male who is a primary patient of [name], [place]. The care management team was consulted for assistance with care management and/or care coordination needs.   ? ?Telephonic RN Care Manager completed Telephone Visit today.  ? ?Objective: ? ?Medications Reviewed Today   ? ? Reviewed by Early Osmond, MD (Physician) on 12/30/21 at 1525  Med List Status: <None>  ? ?Medication Order Taking? Sig Documenting Provider Last Dose Status Informant  ?apixaban (ELIQUIS) 5 MG TABS tablet [id number] Yes Take 1 tablet (5 mg total) by mouth 2 (two) times daily. [place] Eric J, DO Taking Active   ?clopidogrel (PLAVIX) 75 MG tablet [id number] Yes Take 1 tablet (75 mg total) by mouth daily. [place] Donnamarie Poag, DO Taking  Active   ?empagliflozin (JARDIANCE) 10 MG TABS tablet [id number] Yes Take 1 tablet (10 mg total) by mouth daily before breakfast. Consuelo Pandy, PA-C Taking Active   ?furosemide (LASIX) 20 MG tablet [id number] Yes Take 1 tablet (20 mg total) by mouth daily. Early Osmond, MD  Active   ?insulin isophane & regular human KwikPen (NOVOLIN 70/30 KWIKPEN) (70-30) 100 UNIT/ML KwikPen [id number] Yes Inject 22 Units into the skin 2 (two) times daily before a meal. [place] Donnamarie Poag, DO Taking Active   ?Insulin Pen Needle (PEN NEEDLES 3/16") 31G X 5 MM MISC [id number] Yes Use as directed with insulin pen [place] Donnamarie Poag, DO Taking Active   ?losartan (COZAAR) 25 MG tablet [id number] Yes Take 1/2 tablet (12.5 mg total) by mouth daily. [place] Donnamarie Poag, DO Taking Active   ?metoprolol succinate (TOPROL XL) 25 MG 24 hr tablet [id number] Yes Take 1 tablet (25 mg total) by mouth at bedtime. Early Osmond, MD  Active   ?rosuvastatin (CRESTOR) 40 MG tablet [id number] Yes Take 1 tablet (40 mg total) by mouth daily. [place] Eric J, DO Taking Active   ?spironolactone (ALDACTONE) 25 MG tablet [id number] Yes Take 0.5 tablets (12.5 mg total) by mouth daily. Consuelo Pandy, PA-C Taking Active   ? ?  ?  ? ?  ? ? ? ?SDOH:  (Social Determinants of Health) assessments and interventions performed:  ? ? ?Care Plan ? ?Review of patient past medical history, allergies, medications, health status, including review of consultants  reports, laboratory and other test data, was performed as part of comprehensive evaluation for care management services.  ? ?Care Plan : RN Care Manager Plan of Care  ?Updates made by Barbaraann Faster, RN since 01/03/2022 12:00 AM  ?Completed 01/03/2022  ? ?Problem: Complex Care Coordination Needs and disease management in patient with stroke Resolved 12/29/2021  ?Priority: High  ?Onset Date: 12/20/2021  ?  ? ?Goal: Establish Plan of Care for Management Complex SDOH Barriers, disease management and Care Coordination  Needs in patient with stroke Completed 12/29/2021  ?Start Date: 12/20/2021  ?Expected End Date: 01/14/2022  ?This Visit's Progress: On track  ?Recent Progress: On track  ?Priority: High  ?Note:   ?Current Barriers:  ?Knowledge Deficits related to plan of care for management of stroke  ?Care Coordination needs related to Medication procurement and Limited education about stroke* ?Difficulty obtaining medications ?Barriers- No insurance coverage/pending Friday Health coverage per pt (12/18/21?), works full time, no Engineer, maintenance (IT) goal met Pt was able to secure his Friday health coverage by 12/29/21 ? ?RN CM Clinical Goal(s):  ?Patient will verbalize understanding of plan for management of stroke and medication procurement as evidenced by verbalization that medications received and teach back of stroke education  through collaboration with RN Care manager, provider, and care team.  ? ?Interventions: ?Outreaches for care coordination, disease management, resources, home care/education needs ?Inter-disciplinary care team collaboration (see longitudinal plan of care) ?Evaluation of current treatment plan related to  self management and patient's adherence to plan as established by provider ? ? ?Stroke:  (Status:Goal Met.) Short Term Goal ?Reviewed Importance of taking all medications as prescribed ?Reviewed Importance of attending all scheduled provider appointments ?Screening for signs and symptoms of depression related to chronic disease state ?Assessed social determinant of health barriers ?Assessed for signs and symptoms of stroke ?12/20/21 Outreach to pcp office [phone] spoke with Hilda Blades ?Hilda Blades confirms the pcp office does not have a pharmacy staff member. Transferred to [facility] who confirms patient had not been seen in the office since March 2022 and had not followed up as recommended for hospital discharge.   ?The office staff will be able to assist him with the Eliquis patient assistance program.  Updated pt via voice message & when he returned a call.  He was encouraged to make a hospital follow up appointment with his pcp to proceed with the Eliquis medication assistance program application ?RN CM outreached to [facility] transition of care pharmacist E McMichael via [facility] to inquire if the [facility] program assistance remains available for transitional assistance  ?Spoke with Junius Creamer Transition of care  pharmacist in absence of McMichael to inquire about the use of the [facility] program. Since Tommy Busbee was assisted to get all [facility] his [place] his [facility] program services is not accessible. Wandra Scot for assisting ?12/22/21 Received MATCH medicine assist from [facility] at discharge, PCP to assist with Eliquis pt assistance application, provided goodrx 12/22/21 was connected with cardiac vascular SW for SDOH financial needs  ?12/29/21 Pt agrees he is able to afford Eliquis for $15/60 tabs when he and RN CM completed a conference call to [place] at [place] ?Encouraged him to make a hospital follow up with his pcp and neurologist. Discussed the importance of these appointments and pcp services ? ?Interdisciplinary Collaboration Interventions:  (Status: Goal Met.) Short Term Goal   ?Collaborated with RN CM, SW to initiate plan of care to address needs related to  Financial constraints related to medications, no insurance  in patient with  stroke ?12/22/21 Received MATCH medicine assist from [facility] at discharge, PCP to assist with Eliquis pt assistance application, provided goodrx 12/22/21 was connected with cardiac vascular SW for SDOH financial needs  ?12/29/21 Barrier goal met Pt was able to secure his Friday health coverage by 12/29/21  ? ? ?Patient Goals/Self-Care Activities: ?Take all medications as prescribed ?Attend all scheduled provider appointments ?Call pharmacy for medication refills 3-7 days in advance of running out of medications ?Perform all self care activities  independently  ?Perform IADL's (shopping, preparing meals, housekeeping, managing finances) independently ?Call provider office for new concerns or questions  ?Barrier goal met Pt was able to secure his Friday healt

## 2021-12-30 ENCOUNTER — Other Ambulatory Visit (HOSPITAL_COMMUNITY): Payer: Self-pay

## 2021-12-30 ENCOUNTER — Ambulatory Visit (INDEPENDENT_AMBULATORY_CARE_PROVIDER_SITE_OTHER): Payer: 59

## 2021-12-30 ENCOUNTER — Encounter: Payer: Self-pay | Admitting: *Deleted

## 2021-12-30 ENCOUNTER — Ambulatory Visit (INDEPENDENT_AMBULATORY_CARE_PROVIDER_SITE_OTHER): Payer: 59 | Admitting: Internal Medicine

## 2021-12-30 ENCOUNTER — Encounter: Payer: Self-pay | Admitting: Internal Medicine

## 2021-12-30 VITALS — BP 118/62 | HR 84 | Ht 68.0 in | Wt 142.6 lb

## 2021-12-30 DIAGNOSIS — E118 Type 2 diabetes mellitus with unspecified complications: Secondary | ICD-10-CM

## 2021-12-30 DIAGNOSIS — I4891 Unspecified atrial fibrillation: Secondary | ICD-10-CM | POA: Diagnosis not present

## 2021-12-30 DIAGNOSIS — I639 Cerebral infarction, unspecified: Secondary | ICD-10-CM

## 2021-12-30 DIAGNOSIS — I255 Ischemic cardiomyopathy: Secondary | ICD-10-CM | POA: Diagnosis not present

## 2021-12-30 DIAGNOSIS — E1169 Type 2 diabetes mellitus with other specified complication: Secondary | ICD-10-CM

## 2021-12-30 DIAGNOSIS — Z794 Long term (current) use of insulin: Secondary | ICD-10-CM

## 2021-12-30 DIAGNOSIS — I5022 Chronic systolic (congestive) heart failure: Secondary | ICD-10-CM | POA: Diagnosis not present

## 2021-12-30 DIAGNOSIS — I2510 Atherosclerotic heart disease of native coronary artery without angina pectoris: Secondary | ICD-10-CM

## 2021-12-30 DIAGNOSIS — Z9861 Coronary angioplasty status: Secondary | ICD-10-CM

## 2021-12-30 DIAGNOSIS — E785 Hyperlipidemia, unspecified: Secondary | ICD-10-CM

## 2021-12-30 DIAGNOSIS — E1159 Type 2 diabetes mellitus with other circulatory complications: Secondary | ICD-10-CM

## 2021-12-30 DIAGNOSIS — I152 Hypertension secondary to endocrine disorders: Secondary | ICD-10-CM

## 2021-12-30 MED ORDER — METOPROLOL SUCCINATE ER 25 MG PO TB24: 25.0000 mg | ORAL_TABLET | Freq: Every day | ORAL | 1 refills | Status: DC

## 2021-12-30 MED ORDER — FUROSEMIDE 20 MG PO TABS: 20.0000 mg | ORAL_TABLET | Freq: Every day | ORAL | 5 refills | Status: DC

## 2021-12-30 NOTE — Progress Notes (Signed)
?Cardiology Office Note:   ? ?Date:  12/30/2021  ? ?ID:  Tommy Jimenez, DOB 03/05/1966, MRN [id number] ? ?PCP:  [name], PA-C  ? ?[facility] Providers ?Cardiologist:  Lenna Sciara, MD ?Referring MD: [name], PA-C  ? ?Chief Complaint/Reason for Referral:  Recommendations regarding PFO ? ?ASSESSMENT:   ? ?1. Cerebrovascular accident (CVA), unspecified mechanism ([place])   ?2. Ischemic cardiomyopathy   ?3. CAD S/P percutaneous coronary angioplasty   ?4. Chronic systolic CHF (congestive heart failure), NYHA class 2 ([place])   ?5. Type 2 diabetes mellitus with complication, with long-term current use of insulin ([place])   ?6. Hypertension associated with diabetes ([place])   ?7. Hyperlipidemia associated with type 2 diabetes mellitus ([place])   ? ? ?PLAN:   ? ?In order of problems listed above: ?1.  The PFO detected is likely incidental due to a very low rope score.  Would continue medical management with Plavix and Eliquis.  No need to address PFO.  We will follow-up monitor and need for anticoagulation will be informed by that.  For now continue Eliquis.  Follow-up with structural heart division on a as needed basis ?2.  The patient has a severe ischemic cardiomyopathy.  He is scheduled to see heart failure in the next few weeks.  Though he is euvolemic on exam today he is short of breath at times.  I will start Lasix 20 mg daily.  I will increase his Toprol to 25 mg and have him take it at night. ?3.  Continue Eliquis in lieu of aspirin.  We will discontinue the patient's aspirin.  Continue Plavix for now as well. ?4.  Increase Toprol to 25 mg at bedtime and continue Cozaar 12.5 mg, Aldactone 12.5 mg, and Jardiance 10 mg. ?5.  Continue Jardiance, Eliquis/Plavix in lieu of aspirin, rosuvastatin, and losartan. ?6.  Blood pressures well controlled on his current regimen. ?7.  Goal LDL is less than 70; he was started on rosuvastatin recently and will need lipid panel and LFTs in the near future. ? ? ?     ? ?   ? ?Dispo:  No follow-ups on file.  ? ?  ? ?Medication Adjustments/Labs and Tests Ordered: ?Current medicines are reviewed at length with the patient today.  Concerns regarding medicines are outlined above. ? ?The following changes have been made:    ? ?Labs/tests ordered: ?No orders of the defined types were placed in this encounter. ? ? ?Medication Changes: ?Meds ordered this encounter  ?Medications  ? metoprolol succinate (TOPROL XL) 25 MG 24 hr tablet  ?  Sig: Take 1 tablet (25 mg total) by mouth at bedtime.  ?  Dispense:  90 tablet  ?  Refill:  1  ? furosemide (LASIX) 20 MG tablet  ?  Sig: Take 1 tablet (20 mg total) by mouth daily.  ?  Dispense:  30 tablet  ?  Refill:  5  ? ? ? ?Current medicines are reviewed at length with the patient today.  The patient does not have concerns regarding medicines. ? ? ?History of Present Illness:   ? ?FOCUSED PROBLEM LIST:   ?1.  Anterior STEMI in 2018 status post PCI of ostial LAD ?2.  Ischemic cardiomyopathy with ejection fraction 20 to 25%  ?3.  Stroke with MRI of the brain March 2023 demonstrated no acute ischemia right frontal cortex cerebral infarcts MRA showed no large vessel occlusion; ROPE score 2 (20% recurrence risk of stroke in the next 2 years and 0%  chance of PFO is attributable to stroke) ?4.  Hypertension ?5.  Hyperlipidemia ? ?The patient is a 56 y.o. male with the indicated medical history here for recommendations regarding PFO in the context of a recent right frontal small infarct.  The patient had an extensive work-up as detailed above which demonstrated severe left ventricular dysfunction.  He was seen by neurology and was started on Eliquis and Plavix.  A TEE demonstrated an ejection fraction of 20 to 25% with a saline bubble study being positive for intra-atrial shunt.  A monitor was also ordered. ?   ? ? ? ? ?Current Medications: ?Current Meds  ?Medication Sig  ? apixaban (ELIQUIS) 5 MG TABS tablet Take 1 tablet (5 mg total) by mouth 2 (two) times  daily.  ? clopidogrel (PLAVIX) 75 MG tablet Take 1 tablet (75 mg total) by mouth daily.  ? empagliflozin (JARDIANCE) 10 MG TABS tablet Take 1 tablet (10 mg total) by mouth daily before breakfast.  ? furosemide (LASIX) 20 MG tablet Take 1 tablet (20 mg total) by mouth daily.  ? insulin isophane & regular human KwikPen (NOVOLIN 70/30 KWIKPEN) (70-30) 100 UNIT/ML KwikPen Inject 22 Units into the skin 2 (two) times daily before a meal.  ? Insulin Pen Needle (PEN NEEDLES 3/16") 31G X 5 MM MISC Use as directed with insulin pen  ? losartan (COZAAR) 25 MG tablet Take 1/2 tablet (12.5 mg total) by mouth daily.  ? metoprolol succinate (TOPROL XL) 25 MG 24 hr tablet Take 1 tablet (25 mg total) by mouth at bedtime.  ? rosuvastatin (CRESTOR) 40 MG tablet Take 1 tablet (40 mg total) by mouth daily.  ? spironolactone (ALDACTONE) 25 MG tablet Take 0.5 tablets (12.5 mg total) by mouth daily.  ? [DISCONTINUED] aspirin EC 81 MG tablet Take 81 mg by mouth daily. Swallow whole.  ? [DISCONTINUED] metoprolol succinate (TOPROL-XL) 25 MG 24 hr tablet Take 1/2 tablet (12.5 mg total) by mouth daily.  ?  ? ?Allergies:    ?Patient has no known allergies.  ? ?Social History:   ?Social History  ? ?Tobacco Use  ? Smoking status: Former  ?  Years: 20.00  ?  Types: Cigarettes  ? Smokeless tobacco: Never  ? Tobacco comments:  ?  Former smoker x 20 + years but quit after his recent hospitalization 12/03/21  ?Vaping Use  ? Vaping Use: Never used  ?Substance Use Topics  ? Alcohol use: Yes  ?  Comment: socially - beer  ? Drug use: No  ?  ? ?Family Hx: ?Family History  ?Problem Relation Age of Onset  ? Diabetes Mother   ?     complications of diabetes  ? Heart disease Father   ?     died of MI, died age 58yo  ? Hypertension Father   ? Multiple sclerosis Sister   ? Cancer Brother   ?     brain tumor  ? Diabetes Brother   ? Lupus Sister   ? Lupus Sister   ? Stroke Neg Hx   ?  ? ?Review of Systems:   ?Please see the history of present illness.    ?All other  systems reviewed and are negative. ?  ? ? ?EKGs/Labs/Other Test Reviewed:   ? ?EKG:  EKG performed April 2023 that I personally reviewed demonstrates sinus rhythm with anterior infarction pattern and LVH ? ?Prior CV studies: ? ?LHC 05/30/2017 [facility]  ?Angiographic Findings ?Cardiac Arteries and Lesion Findings ?LMCA: 0%. ?LAD: ?Lesion on Prox  LAD: Ostial.100% stenosis 8 mm length reduced to 0%. Pre ?procedure TIMI 0 flow was noted. Post Procedure TIMI III flow was present. ?Poor run off was present. The lesion was diagnosed as High Risk (C). ?Devices used ?- Abbott 0.014" x 190cm BMW J-Tip PTCI Guidewire. Number of passes: 1. ?- 2.5 mm X 12 mm Trek RX. 1 inflation(s) to a max pressure of: 8 atm. ?- 3.5 mm x 12 mm Xience Sierra DES Stent. 1 inflation(s) to a max ?pressure of: 12 atm. ?- [place] Trek 3.5 mm X 12 mm. 1 inflation(s) to a max pressure of: 12 atm. ?LCx: 0%. ?RCA: 0%. ?  ?  ?  ?2D Echo 12/04/21 ?Left ventricular ejection fraction, by estimation, is 20 to 25%. The left ventricle has ?severely decreased function. The left ventricle demonstrates global hypokinesis. The ?left ventricular internal cavity size was mildly dilated. There is moderate left ?ventricular hypertrophy. Left ventricular diastolic parameters are indeterminate. ?2. Right ventricular systolic function is normal. The right ventricular size is normal. ?3. Left atrial size was moderately dilated. ?4. No evidence of mitral valve regurgitation. ?5. Aortic valve regurgitation is not visualized. ?  ?  ?TEE 12/06/21 ?left ventricular ejection fraction, by estimation, is 20 to 25%. The left ventricle has ?severely decreased function. The left ventricular internal cavity size was mildly ?dilated. ?Right ventricular systolic function is normal. The right ventricular size is normal. ?Left atrial size was moderately dilated. No left atrial/left atrial appendage thrombus ?was detected. ?2. The mitral valve is normal in structure. Trivial mitral valve  regurgitation. ?The aortic valve is tricuspid. Aortic valve regurgitation is not visualized. No aortic ?stenosis is present. ?3. Agitated saline contrast bubble study was positive with shunting observed within 3-

## 2021-12-30 NOTE — Patient Instructions (Addendum)
Medication Instructions:  ?Your physician has recommended you make the following change in your medication:  ?1.) stop aspirin ?2.) increase Toprol XL (metoprolol succinate) to 25 mg daily at bedtime ?3.) start furosemide (Lasix) 20 mg one tablet daily ?TAKE Toprol, spironolactone and losartan at BEDTIME ? ?*If you need a refill on your cardiac medications before your next appointment, please call your pharmacy* ? ? ?Lab Work: ?none ? ? ?Testing/Procedures: ?none ? ? ?Follow-Up: ?As needed with Dr. Ali Lowe ? ?Important Information About Sugar ? ? ? ? ?  ?

## 2021-12-30 NOTE — Progress Notes (Unsigned)
[employer] serial # [id number] from office inventory applied to patient. ? ?Original Home enrollment from 4/5 cancelled/ [employer] had not shipped monitor as of 12/30/21. ? ?Dr. Sallyanne Kuster to read. ?

## 2022-01-10 ENCOUNTER — Telehealth (HOSPITAL_COMMUNITY): Payer: Self-pay

## 2022-01-10 NOTE — Telephone Encounter (Signed)
Called to confirm/remind patient of their appointment at the [facility] on 01/12/22.  ? ?Patient reminded to bring all medications and/or complete list. ? ?Confirmed patient has transportation. Gave directions, instructed to utilize [address] parking. ? ?Confirmed appointment prior to ending call.  ? ?

## 2022-01-12 ENCOUNTER — Encounter (HOSPITAL_COMMUNITY): Payer: Self-pay

## 2022-01-12 ENCOUNTER — Ambulatory Visit (HOSPITAL_COMMUNITY)
Admission: RE | Admit: 2022-01-12 | Discharge: 2022-01-12 | Disposition: A | Discharge status: Home / Self Care | Payer: 59 | Source: Ambulatory Visit | Attending: Family Medicine | Admitting: Family Medicine

## 2022-01-12 ENCOUNTER — Other Ambulatory Visit (HOSPITAL_COMMUNITY): Payer: Self-pay

## 2022-01-12 VITALS — BP 128/88 | HR 81 | Ht 68.0 in | Wt 140.0 lb

## 2022-01-12 DIAGNOSIS — Z7902 Long term (current) use of antithrombotics/antiplatelets: Secondary | ICD-10-CM | POA: Diagnosis not present

## 2022-01-12 DIAGNOSIS — I6340 Cerebral infarction due to embolism of unspecified cerebral artery: Secondary | ICD-10-CM | POA: Diagnosis not present

## 2022-01-12 DIAGNOSIS — Z8673 Personal history of transient ischemic attack (TIA), and cerebral infarction without residual deficits: Secondary | ICD-10-CM | POA: Insufficient documentation

## 2022-01-12 DIAGNOSIS — E1169 Type 2 diabetes mellitus with other specified complication: Secondary | ICD-10-CM

## 2022-01-12 DIAGNOSIS — Z8674 Personal history of sudden cardiac arrest: Secondary | ICD-10-CM | POA: Insufficient documentation

## 2022-01-12 DIAGNOSIS — Z7984 Long term (current) use of oral hypoglycemic drugs: Secondary | ICD-10-CM | POA: Diagnosis not present

## 2022-01-12 DIAGNOSIS — I252 Old myocardial infarction: Secondary | ICD-10-CM | POA: Insufficient documentation

## 2022-01-12 DIAGNOSIS — E785 Hyperlipidemia, unspecified: Secondary | ICD-10-CM | POA: Insufficient documentation

## 2022-01-12 DIAGNOSIS — Z794 Long term (current) use of insulin: Secondary | ICD-10-CM

## 2022-01-12 DIAGNOSIS — I639 Cerebral infarction, unspecified: Secondary | ICD-10-CM

## 2022-01-12 DIAGNOSIS — I110 Hypertensive heart disease with heart failure: Secondary | ICD-10-CM | POA: Insufficient documentation

## 2022-01-12 DIAGNOSIS — E1151 Type 2 diabetes mellitus with diabetic peripheral angiopathy without gangrene: Secondary | ICD-10-CM | POA: Diagnosis not present

## 2022-01-12 DIAGNOSIS — I2510 Atherosclerotic heart disease of native coronary artery without angina pectoris: Secondary | ICD-10-CM | POA: Diagnosis not present

## 2022-01-12 DIAGNOSIS — Q2112 Patent foramen ovale: Secondary | ICD-10-CM

## 2022-01-12 DIAGNOSIS — I739 Peripheral vascular disease, unspecified: Secondary | ICD-10-CM

## 2022-01-12 DIAGNOSIS — E1159 Type 2 diabetes mellitus with other circulatory complications: Secondary | ICD-10-CM

## 2022-01-12 DIAGNOSIS — R0683 Snoring: Secondary | ICD-10-CM

## 2022-01-12 DIAGNOSIS — I5022 Chronic systolic (congestive) heart failure: Secondary | ICD-10-CM

## 2022-01-12 DIAGNOSIS — Z87891 Personal history of nicotine dependence: Secondary | ICD-10-CM | POA: Insufficient documentation

## 2022-01-12 DIAGNOSIS — E1165 Type 2 diabetes mellitus with hyperglycemia: Secondary | ICD-10-CM | POA: Diagnosis not present

## 2022-01-12 DIAGNOSIS — E118 Type 2 diabetes mellitus with unspecified complications: Secondary | ICD-10-CM

## 2022-01-12 DIAGNOSIS — Z720 Tobacco use: Secondary | ICD-10-CM

## 2022-01-12 DIAGNOSIS — Z79899 Other long term (current) drug therapy: Secondary | ICD-10-CM | POA: Diagnosis not present

## 2022-01-12 DIAGNOSIS — I152 Hypertension secondary to endocrine disorders: Secondary | ICD-10-CM

## 2022-01-12 DIAGNOSIS — I255 Ischemic cardiomyopathy: Secondary | ICD-10-CM | POA: Diagnosis not present

## 2022-01-12 DIAGNOSIS — Z7901 Long term (current) use of anticoagulants: Secondary | ICD-10-CM | POA: Insufficient documentation

## 2022-01-12 DIAGNOSIS — Z9861 Coronary angioplasty status: Secondary | ICD-10-CM

## 2022-01-12 LAB — BASIC METABOLIC PANEL
Anion gap: 7 (ref 5–15)
BUN: 17 mg/dL (ref 6–20)
CO2: 24 mmol/L (ref 22–32)
Calcium: 8.8 mg/dL — ABNORMAL LOW (ref 8.9–10.3)
Chloride: 107 mmol/L (ref 98–111)
Creatinine, Ser: 0.97 mg/dL (ref 0.61–1.24)
GFR, Estimated: >60 mL/min (ref >60)
Glucose, Bld: 194 mg/dL — ABNORMAL HIGH (ref 70–99)
Potassium: 3.9 mmol/L (ref 3.5–5.1)
Sodium: 138 mmol/L (ref 135–145)

## 2022-01-12 LAB — BRAIN NATRIURETIC PEPTIDE: B Natriuretic Peptide: 158.6 pg/mL — ABNORMAL HIGH (ref 0.0–100.0)

## 2022-01-12 LAB — CBC
HCT: 37.7 % — ABNORMAL LOW (ref 39.0–52.0)
Hemoglobin: 11.7 g/dL — ABNORMAL LOW (ref 13.0–17.0)
MCH: 27.9 pg (ref 26.0–34.0)
MCHC: 31 g/dL (ref 30.0–36.0)
MCV: 89.8 fL (ref 80.0–100.0)
Platelets: 305 10*3/uL (ref 150–400)
RBC: 4.2 MIL/uL — ABNORMAL LOW (ref 4.22–5.81)
RDW: 13.8 % (ref 11.5–15.5)
WBC: 8.2 10*3/uL (ref 4.0–10.5)
nRBC: 0 % (ref 0.0–0.2)

## 2022-01-12 MED ORDER — ENTRESTO 24-26 MG PO TABS: 1.0000 | ORAL_TABLET | Freq: Two times a day (BID) | ORAL | 11 refills | Status: DC

## 2022-01-12 NOTE — Patient Instructions (Signed)
Medication Changes: ? ?Stop Losartan ? ?Start Entresto 24/26 Twice daily ? ? ?Lab Work: ? ?Labs done today, your results will be available in MyChart, we will contact you for abnormal readings. ? ? ?Testing/Procedures: ? ?Repeat blood work in 2 weeks ? ?Your physician has requested that you have an echocardiogram. Echocardiography is a painless test that uses sound waves to create images of your heart. It provides your doctor with information about the size and shape of your heart and how well your heart?s chambers and valves are working. This procedure takes approximately one hour. There are no restrictions for this procedure. ? ? ?Referrals: ? ?You have been referred to [facility] for your post hospital appointment. They should call you to arrange your appointment. ? ?Please follow up with our heart failure pharmacist in 3 weeks  ? ? ?Special Instructions // Education: ? ?none ? ?Follow-Up in: as scheduled  ? ?At the [facility], you and your health needs are our priority. We have a designated team specialized in the treatment of Heart Failure. This Care Team includes your primary Heart Failure Specialized Cardiologist (physician), Advanced Practice Providers (APPs- Physician Assistants and Nurse Practitioners), and Pharmacist who all work together to provide you with the care you need, when you need it.  ? ?You may see any of the following providers on your designated Care Team at your next follow up: ? ?Dr Glori Bickers ?Dr Loralie Champagne ?Darrick Grinder, NP ?Lyda Jester, PA ?Jessica Milford,NP ?Marlyce Huge, PA ?Audry Riles, PharmD ? ? ?Please be sure to bring in all your medications bottles to every appointment.  ? ?Need to Contact us: ? ?If you have any questions or concerns before your next appointment please send [place] a message through [place] or call our office at [phone].   ? ?TO LEAVE A MESSAGE FOR THE NURSE SELECT OPTION 2, PLEASE LEAVE A MESSAGE INCLUDING: ?YOUR  NAME ?DATE OF BIRTH ?CALL BACK NUMBER ?REASON FOR CALL**this is important as we prioritize the call backs ? ?YOU WILL RECEIVE A CALL BACK THE SAME DAY AS LONG AS YOU CALL BEFORE 4:00 PM ? ? ?

## 2022-01-12 NOTE — Progress Notes (Signed)
? ?ADVANCED HF CLINIC CONSULT NOTE ? ?Referring Provider: Lyda Jester, PA ?Primary Care: Gerald Leitz ?HF Cardiologist: Dr. Haroldine Laws ? ?HPI: ?Tommy Jimenez is a 56 y.o.male w/ history of ischemic heart disease, OOH cardiac arrest, IDDM, HTN, HLD, tobacco abuse, CVA and new diagnosis of systolic heart failure. ? ?OOH cardiac arrest in 05/2017 in setting of acute anterior STEMI. Was treated at [facility], where LHC showed totally occluded ostial LAD, treated w/ PCI + DES. Per records there was no other significant CAD. Echocardiogram at the time showed reduced LVEF, 30-35%, and anteroapical AK. F/u echo 06/2017 showed slight improvement, EF up to 40%. It appears he has not had routien f/u w/ cardiology.  ?  ?Admitted 3/23 w/ rt sided numbness and weakness. CT head with no acute intracranial process, multiple old infarcts in the cerebellum.  MR brain with small focus of acute ischemia in the right frontal cortex, no hemorrhage/mass effect, multiple old cerebellar infarcts and findings of small vessel ischemia that is chronic. MRA head/neck with no emergent large vessel occlusion or high-grade stenosis, moderate stenosis of the mid V2 segment of the left vertebral artery. Echo showed reduced LVEF, 20-25%, RV normal. Cardiology consulted and performed TEE, showing severely reduced LVEF 20-25%, no LA thrombus. + Bubble study suggestive of PFO. Neurology recommended a/c w/ Eliquis and 30 day monitor to assess for possible Afib (arranged by cardiology). Referred to [facility] clinic.  ?  ?Seen in [facility] 4/23. Stable NYHA II symptoms, ReDs 37%. [name] and Jardiance started, weight 144 lbs. Referred to Structural heart for PFO. ? ?Today he presents for new AHF follow up. Overall feeling fatigued. He is SOB with walking on flat ground for longer distances or going up stairs. Occasional lightheadedness with standing up quickly. Continues with claudication-like pain in BLE, resolves with rest. Denies abnormal bleeding,  palpitations, CP, edema, or PND/Orthopnea. Appetite ok. No fever or chills. He does not weigh at home (no scale). Taking all medications. 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 ? ?Cardiac Testing  ?- TEE (3/23): EF 20-25%, LV severely reduced, RV ok, no LAA thrombus, Agitated saline contrast bubble study was positive with shunting observed within 3-6 cardiac cycles suggestive of interatrial shunt. ? ?- Echo (3/23): EF 20-25%, severely reduced LV with global HK, moderate LVH, RV ok.  ? ?- LHC 05/30/2017 ([place]):  ?Angiographic Findings ?Cardiac Arteries and Lesion Findings ?LMCA: 0%. ?LAD: ?Lesion on Prox LAD: Ostial.100% stenosis 8 mm length reduced to 0%. Pre ?procedure TIMI 0 flow was noted. Post Procedure TIMI III flow was present. ?Poor run off was present. The lesion was diagnosed as High Risk (C). ?Devices used ?- Abbott 0.014" x 190cm BMW J-Tip PTCI Guidewire. Number of passes: 1. ?- 2.5 mm X 12 mm Trek RX. 1 inflation(s) to a max pressure of: 8 atm. ?- 3.5 mm x 12 mm Xience Sierra DES Stent. 1 inflation(s) to a max ?pressure of: 12 atm. ?-  Trek 3.5 mm X 12 mm. 1 inflation(s) to a max pressure of: 12 atm. ?LCx: 0%. ?RCA: 0%. ?  ?Review of Systems: [y] = yes, '[ ]'  = no  ? ?General: Weight gain '[ ]' ; Weight loss '[ ]' ; Anorexia '[ ]' ; Fatigue Blue.Reese ]; Fever '[ ]' ; Chills '[ ]' ; Weakness '[ ]'   ?Cardiac: Chest pain/pressure '[ ]' ; Resting SOB '[ ]' ; Exertional SOB Blue.Reese ]; Orthopnea '[ ]' ; Pedal Edema '[ ]' ; Palpitations '[ ]' ; Syncope '[ ]' ; Presyncope '[ ]' ; Paroxysmal nocturnal  dyspnea'[ ]'   ?Pulmonary: Cough '[ ]' ; Wheezing'[ ]' ; Hemoptysis'[ ]' ; Sputum '[ ]' ; Snoring '[ ]'   ?GI: Vomiting'[ ]' ; Dysphagia'[ ]' ; Melena'[ ]' ; Hematochezia '[ ]' ; Heartburn'[ ]' ; Abdominal pain '[ ]' ; Constipation '[ ]' ; Diarrhea '[ ]' ; BRBPR '[ ]'   ?GU: Hematuria'[ ]' ; Dysuria '[ ]' ; Nocturia'[ ]'   ?Vascular: Pain in legs with walking Blue.Reese ]; Pain in feet with lying flat '[ ]' ; Non-healing sores '[ ]' ; Stroke Blue.Reese ]; TIA '[ ]' ; Slurred speech '[ ]' ;  ?Neuro:  Headaches'[ ]' ; Vertigo'[ ]' ; Seizures'[ ]' ; Paresthesias'[ ]' ;Blurred vision '[ ]' ; Diplopia '[ ]' ; Vision changes '[ ]'   ?Ortho/Skin: Arthritis '[ ]' ; Joint pain '[ ]' ; Muscle pain '[ ]' ; Joint swelling '[ ]' ; Back Pain '[ ]' ; Rash '[ ]'   ?Psych: Depression'[ ]' ; Anxiety'[ ]'   ?Heme: Bleeding problems '[ ]' ; Clotting disorders '[ ]' ; Anemia '[ ]'   ?Endocrine: Diabetes [y]; Thyroid dysfunction'[ ]'  ? ?Past Medical History:  ?Diagnosis Date  ? Coronary artery disease   ? stent placed  ? Dilated cardiomyopathy ([place])   ? DM (diabetes mellitus) ([place]) 1996  ? DVT (deep venous thrombosis) ([place])   ? right leg   ? Erectile dysfunction   ? H/O acute myocardial infarction   ? H/O heart artery stent   ? History of DVT (deep vein thrombosis)   ? HTN (hypertension)   ? Ischemic cardiomyopathy   ? Myocardial infarction [facility])   ? Obesity   ? Peripheral vascular disease ([place])   ? Pure hypercholesterolemia   ? ?Current Outpatient Medications  ?Medication Sig Dispense Refill  ? apixaban (ELIQUIS) 5 MG TABS tablet Take 1 tablet (5 mg total) by mouth 2 (two) times daily. 60 tablet 2  ? clopidogrel (PLAVIX) 75 MG tablet Take 1 tablet (75 mg total) by mouth daily. 30 tablet 2  ? empagliflozin (JARDIANCE) 10 MG TABS tablet Take 1 tablet (10 mg total) by mouth daily before breakfast. 30 tablet 11  ? furosemide (LASIX) 20 MG tablet Take 1 tablet (20 mg total) by mouth daily. 30 tablet 5  ? insulin isophane & regular human KwikPen (NOVOLIN 70/30 KWIKPEN) (70-30) 100 UNIT/ML KwikPen Inject 22 Units into the skin 2 (two) times daily before a meal. 15 mL 2  ? Insulin Pen Needle (PEN NEEDLES 3/16") 31G X 5 MM MISC Use as directed with insulin pen 100 each 2  ? losartan (COZAAR) 25 MG tablet Take 1/2 tablet (12.5 mg total) by mouth daily. 15 tablet 2  ? metoprolol succinate (TOPROL XL) 25 MG 24 hr tablet Take 1 tablet (25 mg total) by mouth at bedtime. 90 tablet 1  ? rosuvastatin (CRESTOR) 40 MG tablet Take 1 tablet (40 mg total) by mouth daily. 30 tablet 2  ? spironolactone  (ALDACTONE) 25 MG tablet Take 0.5 tablets (12.5 mg total) by mouth daily. 45 tablet 3  ? ?No current facility-administered medications for this encounter.  ? ?No Known Allergies ? ?Social History  ? ?Socioeconomic History  ? Marital status: Legally Separated  ?  Spouse name: Not on file  ? Number of children: Not on file  ? Years of education: Not on file  ? Highest education level: Not on file  ?Occupational History  ? Not on file  ?Tobacco Use  ? Smoking status: Some Days  ?  Years: 20.00  ?  Types: Cigarettes  ? Smokeless tobacco: Never  ? Tobacco comments:  ?  Former smoker x 20 + years but quit after his recent hospitalization 12/03/21  ?Vaping Use  ?  Vaping Use: Never used  ?Substance and Sexual Activity  ? Alcohol use: Yes  ?  Comment: socially - beer  ? Drug use: No  ? Sexual activity: Not on file  ?Other Topics Concern  ? Not on file  ?Social History Narrative  ? Former smoker x 20 + years but quit after his recent hospitalization  ? ?Social Determinants of Health  ? ?Financial Resource Strain: Medium Risk  ? Difficulty of Paying Living Expenses: Somewhat hard  ?Food Insecurity: No Food Insecurity  ? Worried About Charity fundraiser in the Last Year: Never true  ? Ran Out of Food in the Last Year: Never true  ?Transportation Needs: No Transportation Needs  ? Lack of Transportation (Medical): No  ? Lack of Transportation (Non-Medical): No  ?Physical Activity: Not on file  ?Stress: No Stress Concern Present  ? Feeling of Stress : Only a little  ?Social Connections: Moderately Integrated  ? Frequency of Communication with Friends and Family: Twice a week  ? Frequency of Social Gatherings with Friends and Family: Twice a week  ? Attends Religious Services: 1 to 4 times per year  ? Active Member of Clubs or Organizations: Yes  ? Attends Archivist Meetings: 1 to 4 times per year  ? Marital Status: Separated  ?Intimate Partner Violence: Not At Risk  ? Fear of Current or Ex-Partner: No  ? Emotionally  Abused: No  ? Physically Abused: No  ? Sexually Abused: No  ? ?Family history: father CAD died from MI at age 6. Mother and brother w/ CKD, brother s/p recent renal transplant. Sister w/ PVD   ?  ?Family History

## 2022-01-18 NOTE — Progress Notes (Incomplete)
***In Progress*** ? ?  ?Advanced Heart Failure Clinic Note  ? ?Referring Provider: Lyda Jester, PA ?Primary Care: Gerald Leitz ?HF Cardiologist: Dr. Haroldine Laws ? ?HPI:  ?Tommy Jimenez is a 56 y.o.male w/ history of ischemic heart disease, OOH cardiac arrest, IDDM, HTN, HLD, tobacco abuse, CVA and new diagnosis of systolic heart failure. ?  ?OOH cardiac arrest in 05/2017 in setting of acute anterior STEMI. Was treated at [facility], where LHC showed totally occluded ostial LAD, treated w/ PCI + DES. Per records there was no other significant CAD. Echocardiogram at the time showed reduced LVEF, 30-35%, and anteroapical AK. F/u echo 06/2017 showed slight improvement, EF up to 40%. It appears he has not had routine f/u w/ cardiology.  ?  ?Admitted 11/2021 w/ right sided numbness and weakness. CT head with no acute intracranial process, multiple old infarcts in the cerebellum.  MR brain with small focus of acute ischemia in the right frontal cortex, no hemorrhage/mass effect, multiple old cerebellar infarcts and findings of small vessel ischemia that is chronic. MRA head/neck with no emergent large vessel occlusion or high-grade stenosis, moderate stenosis of the mid V2 segment of the left vertebral artery. Echo showed reduced LVEF, 20-25%, RV normal. Cardiology consulted and performed TEE, showing severely reduced LVEF 20-25%, no LA thrombus. + Bubble study suggestive of PFO. Neurology recommended a/c w/ Eliquis and 30 day monitor to assess for possible Afib (arranged by cardiology). Referred to [facility] clinic.  ?  ?Seen in [facility] 12/2021. Stable NYHA II symptoms, ReDs 37%. Spironolactone and Jardiance started, weight 144 lbs. Referred to Structural heart for PFO. ?  ?Presented to AHF Clinic for new patient follow up. Overall was feeling fatigued. He was SOB with walking on flat ground for longer distances or going up stairs. Noted occasional lightheadedness with standing up quickly. Continued with  claudication-like pain in BLE that resolves with rest. Denied abnormal bleeding, palpitations, CP, edema, or PND/Orthopnea. Appetite was ok. No fever or chills. He reported that he does not weigh at home (no scale). Reported taking all medications. 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 ? ?Today he returns to HF clinic for pharmacist medication titration. At last visit with MD ***.  ? ?Shortness of breath/dyspnea on exertion? {YES NO:22349}  ?Orthopnea/PND? {YES NO:22349} ?Edema? {YES NO:22349} ?Lightheadedness/dizziness? {YES NO:22349} ?Daily weights at home? {YES NO:22349} ?Blood pressure/heart rate monitoring at home? {YES NO:22349} ?Following low-sodium/fluid-restricted diet? {YES NO:22349} ? ?HF Medications: ?Metoprolol succinate 25 mg daily ?Entresto 24/26 mg BID ?Spironolactone 12.5 mg daily ?Jardiance 10 mg daily ?Lasix 20 mg daily ? ?Has the patient been experiencing any side effects to the medications prescribed?  {YES NO:22349} ? ?Does the patient have any problems obtaining medications due to transportation or finances?   {YES NO:22349} ? ?Understanding of regimen: {excellent/good/fair/poor:19665} ?Understanding of indications: {excellent/good/fair/poor:19665} ?Potential of compliance: {excellent/good/fair/poor:19665} ?Patient understands to avoid NSAIDs. ?Patient understands to avoid decongestants. ?  ? ?Pertinent Lab Values: ?Serum creatinine ***, BUN ***, Potassium ***, Sodium ***, BNP ***, Magnesium ***, Digoxin ***  ? ?Vital Signs: ?Weight: *** (last clinic weight: ***) ?Blood pressure: ***  ?Heart rate: ***  ? ?Assessment/Plan: ?1. Chronic Systolic Heart Failure ?- Primarily Ischemic CM, possibly mixed w/ nonischemic component.   ?- S/p cardiac arrest due to pLAD occlusion in 2018, treated w/ PCI. Peri MI EF ~30-35%. Improved on f/u echo to 40%  ?- Echo 11/2021 EF down to 20-25%, RV normal w/ global HK. Drop in EF in  setting of CVA. No recent ischemic like CP  nor RWMA or EKG abnormalties to suggest underlying coronary ischemia ?- NYHA Class II-early III. Euvolemic on exam ?- Continue Lasix 20 mg daily  ?- Continue metoprolol succinate 25 mg daily.  ?- Continue Entresto 24/26 mg BID.  ?- Continue spironolactone 12.5 mg daily  ?- Continue Jardiance 10 mg daily (Hgb A1c 8.3) ?- Plan repeat echo 03/2022. If EF not improved, will need to revisit LHC. Will defer for now given recent CVA and absence of ischemic CP.  ?  ?2. Recent CVA ?- Neurology felt cardio embolic, though TEE showed no thrombus. ?- Continue Eliquis 5 mg BID. ?- Continue statin.  ?- Wearing 30-day monitor. ?- He needs Neuro follow up, scheduled for 01/27/22 ?  ?3. PFO  ?- Noted on TEE. ?- Seen by Dr. Ali Lowe, no need for closure. ?- Continue medical management. ?  ?4. CAD  ?- History of OOH cardiac arrest in 05/2017 in setting of acute anterior STEMI w/ ostial LAD occlusion treated w/ PCI. Per cath report there was no other significant disease. ?- Given slight progression in LV dysfunction, consider repeat LHC once he is further out from recent CVA. ?- No CP.  ?- Continue clopidogrel, statin + ? blocker, no ASA with Eliquis. ?  ?5. Claudication  ?- Complaints of b/l calf claudication.  ?- Multiple risk factors for PVD (CAD, DM, HLD and tobacco use) ?- LE arterial dopplers arranged, refer to Dr. Gwenlyn Found if abnormal. *** ?- Smoking cessation advised.  ?  ?6. Hypertension  ?- Controlled on current regimen ?- GDMT optimization per above ?  ?7. Type 2 DM  ?- Poorly controlled, Hgb A1c 8.3. ?- On insulin. ?- Continue SGLT2i. No GU symptoms. ?  ?8. HLD, LDL Goal < 70 ?- LDL elevated at 148 mg/dL. ?- Continue Crestor 40 mg daily. ?- Lipids/LFTs next visit. ?- if not at goal will need addition of Zetia +/- PCSK9i ?  ?9. Tobacco abuse  ?- Discussed complete cessation. ?- He is down from 1/2 ppd to 3-4 cig/day.  ?  ?10. Snoring ?- Consider sleep study. ? ?Follow up *** ? ? ?Audry Riles, PharmD, BCPS, BCCP, CPP ?Heart Failure  Clinic Pharmacist ?[phone] ?  ?

## 2022-01-21 NOTE — Progress Notes (Signed)
? ?Cardiology Clinic Note  ? ?Patient Name: Tommy Jimenez ?Date of Encounter: 01/24/2022 ? ?Primary Care Provider:  [name], PA-C ?Primary Cardiologist:  Sanda Klein, MD ? ?Patient Profile  ?  ?Tommy Jimenez 56 year old male presents to the clinic today for follow-up evaluation of his coronary artery disease, chronic systolic CHF, and hypertension. ? ?Past Medical History  ?  ?Past Medical History:  ?Diagnosis Date  ? Coronary artery disease   ? stent placed  ? Dilated cardiomyopathy ([place])   ? DM (diabetes mellitus) ([place]) 1996  ? DVT (deep venous thrombosis) ([place])   ? right leg   ? Erectile dysfunction   ? H/O acute myocardial infarction   ? H/O heart artery stent   ? History of DVT (deep vein thrombosis)   ? HTN (hypertension)   ? Ischemic cardiomyopathy   ? Myocardial infarction [facility])   ? Obesity   ? Peripheral vascular disease ([place])   ? Pure hypercholesterolemia   ? ?Past Surgical History:  ?Procedure Laterality Date  ? BUBBLE STUDY  12/06/2021  ? Procedure: BUBBLE STUDY;  Surgeon: Donato Heinz, MD;  Location: [place];  Service: Cardiovascular;;  ? CORONARY STENT PLACEMENT    ? INCISION AND DRAINAGE PERIRECTAL ABSCESS Left 11/20/2017  ? Procedure: IRRIGATION AND DEBRIDEMENT PERIRECTAL ABSCESS;  Surgeon: Stark Klein, MD;  Location: [facility];  Service: General;  Laterality: Left;  ? TEE WITHOUT CARDIOVERSION N/A 12/06/2021  ? Procedure: TRANSESOPHAGEAL ECHOCARDIOGRAM (TEE);  Surgeon: Donato Heinz, MD;  Location: [place];  Service: Cardiovascular;  Laterality: N/A;  ? ? ?Allergies ? ?No Known Allergies ? ?History of Present Illness  ?  ?Tommy Jimenez is a PMH of HTN, ischemic dilated cardiomyopathy, CAD, type 2 diabetes, hyperglycemia, hyperlipidemia, cellulitis, history of tobacco use, hypocalcemia, history of DVT, IDDM, and hypercholesterolemia.  He suffered cardiac arrest 9/18 in the setting of anterior STEMI.  He was treated at [facility].  He underwent  cardiac catheterization which showed totally occluded ostial LAD.  He received PCI with DES.  He was not noted to have other significant coronary disease.  His echocardiogram at that time showed LVEF of 30-35% and anterior apical akinesis.  A repeat echocardiogram 10/18 showed an EF of 40%. He was working as a Astronomer at [employer]. ? ?He was admitted 3/23 with right-sided numbness and weakness.  His head CT showed no acute intracranial process and multiple old infarcts in the cerebellum.  A brain MRI showed acute ischemia in the right frontal cortex, no hemorrhage, multiple old cerebellar infarcts, and small vessel ischemia that was chronic.  Echocardiogram at that time showed an LVEF of 20-25%, and normal RV.  He had a transesophageal echocardiogram that showed LVEF of 20-25%, no left atrial thrombus, and his bubble study showed possible PFO.  He was seen by neurology who recommended initiation of Eliquis and cardiac event monitor for possible atrial fibrillation. ? ?He was seen in follow-up by Allena Katz FNP on 01/12/2022.  During that time he reported that he was feeling fatigued and noted shortness of breath with walking on flat ground.  He noted occasional lightheadedness with standing up quickly.  He reported bilateral lower extremity claudication which resolved with rest.  He reported smoking 3 to 4 cigarettes/day.  He denied chest pain, palpitations, lower extremity swelling, orthopnea and PND.  He reported compliance with his medications.   ? ?He presents to the clinic today for follow-up evaluation states he feels fairly well.  He does note some  slight shortness of breath and reports bilateral lower extremity claudication with walking 20 feet or more.  He continues to work at current works as a Astronomer and reports that every 30 minutes or so he needs to take a short break.  We reviewed the importance of continuing to abstain from cigarette smoking.  We reviewed his echocardiogram and his  current medications.  He is tolerating without side effects.  I will order lower extremity arterial's, ABIs, refer to pharmacy lipid clinic, and plan follow-up in 2 to 3 months. ? ?Today he denies chest pain, increased shortness of breath, lower extremity edema, fatigue, palpitations, melena, hematuria, hemoptysis, diaphoresis, weakness, presyncope, syncope, orthopnea, and PND. ? ? ?Home Medications  ?  ?Prior to Admission medications   ?Medication Sig Start Date End Date Taking? Authorizing Provider  ?apixaban (ELIQUIS) 5 MG TABS tablet Take 1 tablet (5 mg total) by mouth 2 (two) times daily. 12/06/21 03/06/22  [place] Eric J, DO  ?clopidogrel (PLAVIX) 75 MG tablet Take 1 tablet (75 mg total) by mouth daily. 12/06/21 03/06/22  [place] Eric J, DO  ?empagliflozin (JARDIANCE) 10 MG TABS tablet Take 1 tablet (10 mg total) by mouth daily before breakfast. 12/22/21   Lyda Jester M, PA-C  ?furosemide (LASIX) 20 MG tablet Take 1 tablet (20 mg total) by mouth daily. 12/30/21   Early Osmond, MD  ?insulin isophane & regular human KwikPen (NOVOLIN 70/30 KWIKPEN) (70-30) 100 UNIT/ML KwikPen Inject 22 Units into the skin 2 (two) times daily before a meal. 12/06/21 03/18/22  [place] Eric J, DO  ?Insulin Pen Needle (PEN NEEDLES 3/16") 31G X 5 MM MISC Use as directed with insulin pen 12/06/21   [place] Donnamarie Poag, DO  ?metoprolol succinate (TOPROL XL) 25 MG 24 hr tablet Take 1 tablet (25 mg total) by mouth at bedtime. 12/30/21   Early Osmond, MD  ?rosuvastatin (CRESTOR) 40 MG tablet Take 1 tablet (40 mg total) by mouth daily. 12/07/21 03/07/22  [place] Donnamarie Poag, DO  ?sacubitril-valsartan (ENTRESTO) 24-26 MG Take 1 tablet by mouth 2 (two) times daily. 01/12/22   Rafael Bihari, FNP  ?spironolactone (ALDACTONE) 25 MG tablet Take 0.5 tablets (12.5 mg total) by mouth daily. 12/22/21 03/22/22  Consuelo Pandy, PA-C  ? ? ?Family History  ?  ?Family History  ?Problem Relation Age of Onset  ? Diabetes Mother   ?     complications of  diabetes  ? Heart disease Father   ?     died of MI, died age 54yo  ? Hypertension Father   ? Multiple sclerosis Sister   ? Cancer Brother   ?     brain tumor  ? Diabetes Brother   ? Lupus Sister   ? Lupus Sister   ? Stroke Neg Hx   ? ?He indicated that his mother is deceased. He indicated that his father is deceased. He indicated that two of his three sisters are alive. He indicated that two of his three brothers are alive. He indicated that the status of his neg hx is unknown. ? ?Social History  ?  ?Social History  ? ?Socioeconomic History  ? Marital status: Legally Separated  ?  Spouse name: Not on file  ? Number of children: Not on file  ? Years of education: Not on file  ? Highest education level: Not on file  ?Occupational History  ? Not on file  ?Tobacco Use  ? Smoking status: Some Days  ?  Years: 20.00  ?  Types: Cigarettes  ? Smokeless tobacco: Never  ? Tobacco comments:  ?  Former smoker x 20 + years but quit after his recent hospitalization 12/03/21  ?Vaping Use  ? Vaping Use: Never used  ?Substance and Sexual Activity  ? Alcohol use: Yes  ?  Comment: socially - beer  ? Drug use: No  ? Sexual activity: Not on file  ?Other Topics Concern  ? Not on file  ?Social History Narrative  ? Former smoker x 20 + years but quit after his recent hospitalization  ? ?Social Determinants of Health  ? ?Financial Resource Strain: Medium Risk  ? Difficulty of Paying Living Expenses: Somewhat hard  ?Food Insecurity: No Food Insecurity  ? Worried About Charity fundraiser in the Last Year: Never true  ? Ran Out of Food in the Last Year: Never true  ?Transportation Needs: No Transportation Needs  ? Lack of Transportation (Medical): No  ? Lack of Transportation (Non-Medical): No  ?Physical Activity: Not on file  ?Stress: No Stress Concern Present  ? Feeling of Stress : Only a little  ?Social Connections: Moderately Integrated  ? Frequency of Communication with Friends and Family: Twice a week  ? Frequency of Social Gatherings  with Friends and Family: Twice a week  ? Attends Religious Services: 1 to 4 times per year  ? Active Member of Clubs or Organizations: Yes  ? Attends Archivist Meetings: 1 to 4 times per year  ?

## 2022-01-24 ENCOUNTER — Encounter: Payer: Self-pay | Admitting: General Practice

## 2022-01-24 ENCOUNTER — Ambulatory Visit (INDEPENDENT_AMBULATORY_CARE_PROVIDER_SITE_OTHER): Payer: 59 | Admitting: General Practice

## 2022-01-24 VITALS — BP 100/65 | HR 89 | Ht 68.0 in | Wt 141.8 lb

## 2022-01-24 DIAGNOSIS — I152 Hypertension secondary to endocrine disorders: Secondary | ICD-10-CM

## 2022-01-24 DIAGNOSIS — E1169 Type 2 diabetes mellitus with other specified complication: Secondary | ICD-10-CM | POA: Diagnosis not present

## 2022-01-24 DIAGNOSIS — I5022 Chronic systolic (congestive) heart failure: Secondary | ICD-10-CM | POA: Diagnosis not present

## 2022-01-24 DIAGNOSIS — E1159 Type 2 diabetes mellitus with other circulatory complications: Secondary | ICD-10-CM

## 2022-01-24 DIAGNOSIS — I739 Peripheral vascular disease, unspecified: Secondary | ICD-10-CM | POA: Diagnosis not present

## 2022-01-24 DIAGNOSIS — I6340 Cerebral infarction due to embolism of unspecified cerebral artery: Secondary | ICD-10-CM

## 2022-01-24 DIAGNOSIS — E785 Hyperlipidemia, unspecified: Secondary | ICD-10-CM

## 2022-01-24 DIAGNOSIS — I2510 Atherosclerotic heart disease of native coronary artery without angina pectoris: Secondary | ICD-10-CM | POA: Diagnosis not present

## 2022-01-24 DIAGNOSIS — Z9861 Coronary angioplasty status: Secondary | ICD-10-CM

## 2022-01-24 NOTE — Patient Instructions (Signed)
Medication Instructions:  ?Your physician recommends that you continue on your current medications as directed. Please refer to the Current Medication list given to you today. ?*If you need a refill on your cardiac medications before your next appointment, please call your pharmacy* ? ? ?Lab Work: ?None Ordered ? ? ?Testing/Procedures: ?Your physician has requested that you have an ankle brachial index (ABI). During this test an ultrasound and blood pressure cuff are used to evaluate the arteries that supply the arms and legs with blood. Allow thirty minutes for this exam. There are no restrictions or special instructions. ? ?Your physician has requested that you have a lower extremity arterial exercise duplex. During this test, exercise and ultrasound are used to evaluate arterial blood flow in the legs. Allow one hour for this exam. There are no restrictions or special instructions. ? ? ?Follow-Up: ?At [facility], you and your health needs are our priority.  As part of our continuing mission to provide you with exceptional heart care, we have created designated Provider Care Teams.  These Care Teams include your primary Cardiologist (physician) and Advanced Practice Providers (APPs -  Physician Assistants and Nurse Practitioners) who all work together to provide you with the care you need, when you need it. ? ?We recommend signing up for the patient portal called "MyChart".  Sign up information is provided on this After Visit Summary.  MyChart is used to connect with patients for Virtual Visits (Telemedicine).  Patients are able to view lab/test results, encounter notes, upcoming appointments, etc.  Non-urgent messages can be sent to your provider as well.   ?To learn more about what you can do with MyChart, go to [email or web address].   ? ?Your next appointment:   ?2-3 month(s) ? ?The format for your next appointment:   ?In Person ? ?Provider:   ?Sanda Klein, MD or Coletta Memos, NP  ? ? ?Other  Instructions ?Increase your walking as tolerated ?Salty 6 handout ?Needs appointment with [facility] ? ? ?Important Information About Sugar ? ? ? ? ?  ?

## 2022-01-26 ENCOUNTER — Ambulatory Visit (HOSPITAL_COMMUNITY)
Admission: RE | Admit: 2022-01-26 | Discharge: 2022-01-26 | Disposition: A | Discharge status: Home / Self Care | Payer: 59 | Source: Ambulatory Visit | Attending: Cardiology | Admitting: Cardiology

## 2022-01-26 DIAGNOSIS — I5022 Chronic systolic (congestive) heart failure: Secondary | ICD-10-CM | POA: Diagnosis present

## 2022-01-26 LAB — BASIC METABOLIC PANEL
Anion gap: 8 (ref 5–15)
BUN: 19 mg/dL (ref 6–20)
CO2: 22 mmol/L (ref 22–32)
Calcium: 9.1 mg/dL (ref 8.9–10.3)
Chloride: 108 mmol/L (ref 98–111)
Creatinine, Ser: 0.97 mg/dL (ref 0.61–1.24)
GFR, Estimated: >60 mL/min (ref >60)
Glucose, Bld: 143 mg/dL — ABNORMAL HIGH (ref 70–99)
Potassium: 4.1 mmol/L (ref 3.5–5.1)
Sodium: 138 mmol/L (ref 135–145)

## 2022-01-27 ENCOUNTER — Other Ambulatory Visit (HOSPITAL_COMMUNITY): Payer: Self-pay

## 2022-01-27 ENCOUNTER — Inpatient Hospital Stay: Payer: 59 | Admitting: Neurology

## 2022-01-27 NOTE — Progress Notes (Deleted)
Patient: Tommy Jimenez Date of Birth: 09-04-1966  Reason for Visit: Follow up for stroke 12/03/2021 History from: Patient Primary Neurologist: Saw. Dr. Erlinda Hong   ASSESSMENT AND PLAN 56 y.o. year old male   1.  Stroke, right frontal small infarct, did not align with symptoms, concerning for embolic cardiac source given extensive cardiac history -Currently on Plavix 75 mg daily, Eliquis 5 mg twice a day; planning for repeat echo 3 months from stroke event, Dr. Erlinda Hong recommended if EF > 30% will switch back to antiplatelet alone -Will need to correlate with cardiology for anticoagulation with extensive cardiac history, he is still wearing heart monitor  2.  Diabetes, uncontrolled -A1c 8.3, goal less than 7 -On insulin  3.  Hypertension -Long-term goal normotensive  4.  Hyperlipidemia -LDL 148, goal less than 70 -On Crestor 40 mg  5.  History of CAD/MI, cardiac arrest post stenting, cardiomyopathy -ECHO planned 04/14/22 seeing Dr. Haroldine Laws  6.  PFO -Noted on TEE -Dr. Ali Lowe recommended no need for closure  HISTORY OF PRESENT ILLNESS: Today 01/27/22 Tommy Jimenez is here today for stroke clinic follow-up for 2 episodes of right sided numbness and weakness. MRI showed right frontal small infarct, but concerning embolic likely cardiac source given extensive cardiac history. PFO was detected, felt likely incidental due to very low rope score. Saw. Dr. Ali Lowe 12/30/21, no need to address PFO.  Aspirin was discontinued, on Plavix and Eliquis. Has been seen in CHF clinic, medications adjusted, losartan was stopped, started [place], Jardiance, Lasix. 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  EF 20 to 25%, no LV thrombus -LDL 148 -A1c 8.3 -Aspirin 81 mg and Brilinta 90 mg twice daily PTA,  given low EF with stroke, recommend AC with DOAC, such as Eliquis.  Repeat echo in 3 months, if EF greater then 30% will switch back to antiplatelet alone   HISTORY  Copied Dr. Leonel Ramsay 12/03/2021: HPI: Tommy Jimenez is a 56 y.o. male with a history of diabetes, cardiomyopathy, hypercholesterolemia, coronary artery disease on aspirin and Brilinta who presents with two episodes of right-sided numbness and weakness.  He states that he had left work, and his right leg suddenly went numb and weak.  It only involves the leg at the time, lasted approximately 10 minutes.  Today, he had another episode while washing dishes, he had to hold onto a sink to prevent falling.  Today he had involved his right hand as well as his right leg and therefore he sought care in the emergency department.  He has been admitted for TIA work-up.  REVIEW OF SYSTEMS: Out of a complete 14 system review of symptoms, the patient complains only of the following symptoms, and all other reviewed systems are negative.  See HPI  ALLERGIES: No Known Allergies  HOME MEDICATIONS: Outpatient Medications Prior to Visit  Medication Sig Dispense Refill   apixaban (ELIQUIS) 5 MG TABS tablet Take 1 tablet (5 mg total) by mouth 2 (two) times daily. 60 tablet 2   clopidogrel (PLAVIX) 75 MG tablet Take 1 tablet (75 mg total) by mouth daily. 30 tablet 2   empagliflozin (JARDIANCE) 10  MG TABS tablet Take 1 tablet (10 mg total) by mouth daily before breakfast. 30 tablet 11   furosemide (LASIX) 20 MG tablet Take 1 tablet (20 mg total) by mouth daily. 30 tablet 5   insulin isophane & regular human KwikPen (NOVOLIN 70/30 KWIKPEN) (70-30) 100 UNIT/ML KwikPen Inject 22 Units into the skin 2 (two) times daily before a meal. 15 mL 2   Insulin Pen Needle (PEN NEEDLES  3/16") 31G X 5 MM MISC Use as directed with insulin pen 100 each 2   metoprolol succinate (TOPROL XL) 25 MG 24 hr tablet Take 1 tablet (25 mg total) by mouth at bedtime. 90 tablet 1   rosuvastatin (CRESTOR) 40 MG tablet Take 1 tablet (40 mg total) by mouth daily. 30 tablet 2   sacubitril-valsartan (ENTRESTO) 24-26 MG Take 1 tablet by mouth 2 (two) times daily. 60 tablet 11   spironolactone (ALDACTONE) 25 MG tablet Take 0.5 tablets (12.5 mg total) by mouth daily. 45 tablet 3   No facility-administered medications prior to visit.    PAST MEDICAL HISTORY: Past Medical History:  Diagnosis Date   Coronary artery disease    stent placed   Dilated cardiomyopathy ([place])    DM (diabetes mellitus) ([place]) 1996   DVT (deep venous thrombosis) ([facility])    right leg    Erectile dysfunction    H/O acute myocardial infarction    H/O heart artery stent    History of DVT (deep vein thrombosis)    HTN (hypertension)    Ischemic cardiomyopathy    Myocardial infarction ([place])    Obesity    Peripheral vascular disease ([place])    Pure hypercholesterolemia     PAST SURGICAL HISTORY: Past Surgical History:  Procedure Laterality Date   BUBBLE STUDY  12/06/2021   Procedure: BUBBLE STUDY;  Surgeon: Donato Heinz, MD;  Location: [facility];  Service: Cardiovascular;;   CORONARY STENT PLACEMENT     INCISION AND DRAINAGE PERIRECTAL ABSCESS Left 11/20/2017   Procedure: IRRIGATION AND DEBRIDEMENT PERIRECTAL ABSCESS;  Surgeon: Stark Klein, MD;  Location: [facility];  Service: General;  Laterality: Left;   TEE WITHOUT CARDIOVERSION N/A 12/06/2021   Procedure: TRANSESOPHAGEAL ECHOCARDIOGRAM (TEE);  Surgeon: Donato Heinz, MD;  Location: [facility];  Service: Cardiovascular;  Laterality: N/A;    FAMILY HISTORY: Family History  Problem Relation Age of Onset   Diabetes Mother        complications of diabetes   Heart disease Father        died of MI, died age 26yo   Hypertension Father    Multiple  sclerosis Sister    Cancer Brother        brain tumor   Diabetes Brother    Lupus Sister    Lupus Sister    Stroke Neg Hx     SOCIAL HISTORY: Social History   Socioeconomic History   Marital status: Legally Separated    Spouse name: Not on file   Number of children: Not on file   Years of education: Not on file   Highest education level: Not on file  Occupational History   Not on file  Tobacco Use   Smoking status: Some Days    Years: 20.00    Types: Cigarettes   Smokeless tobacco: Never   Tobacco comments:    Former smoker x 20 + years but quit after his recent hospitalization 12/03/21  Vaping Use   Vaping Use: Never used  Substance and Sexual Activity   Alcohol  use: Yes    Comment: socially - beer   Drug use: No   Sexual activity: Not on file  Other Topics Concern   Not on file  Social History Narrative   Former smoker x 20 + years but quit after his recent hospitalization   Social Determinants of Health   Financial Resource Strain: Medium Risk   Difficulty of Paying Living Expenses: Somewhat hard  Food Insecurity: No Food Insecurity   Worried About Charity fundraiser in the Last Year: Never true   Ran Out of Food in the Last Year: Never true  Transportation Needs: No Transportation Needs   Lack of Transportation (Medical): No   Lack of Transportation (Non-Medical): No  Physical Activity: Not on file  Stress: No Stress Concern Present   Feeling of Stress : Only a little  Social Connections: Moderately Integrated   Frequency of Communication with Friends and Family: Twice a week   Frequency of Social Gatherings with Friends and Family: Twice a week   Attends Religious Services: 1 to 4 times per year   Active Member of [employer] or Organizations: Yes   Attends Archivist Meetings: 1 to 4 times per year   Marital Status: Separated  Intimate Partner Violence: Not At Risk   Fear of Current or Ex-Partner: No   Emotionally Abused: No   Physically Abused:  No   Sexually Abused: No    PHYSICAL EXAM  There were no vitals filed for this visit. There is no height or weight on file to calculate BMI.  Generalized: Well developed, in no acute distress  Neurological examination  Mentation: Alert oriented to time, place, history taking. Follows all commands speech and language fluent Cranial nerve II-XII: Pupils were equal round reactive to light. Extraocular movements were full, visual field were full on confrontational test. Facial sensation and strength were normal. Uvula tongue midline. Head turning and shoulder shrug  were normal and symmetric. Motor: The motor testing reveals 5 over 5 strength of all 4 extremities. Good symmetric motor tone is noted throughout.  Sensory: Sensory testing is intact to soft touch on all 4 extremities. No evidence of extinction is noted.  Coordination: Cerebellar testing reveals good finger-nose-finger and heel-to-shin bilaterally.  Gait and station: Gait is normal. Tandem gait is normal. Romberg is negative. No drift is seen.  Reflexes: Deep tendon reflexes are symmetric and normal bilaterally.   DIAGNOSTIC DATA (LABS, IMAGING, TESTING) - I reviewed patient records, labs, notes, testing and imaging myself where available.  Lab Results  Component Value Date   WBC 8.2 01/12/2022   HGB 11.7 (L) 01/12/2022   HCT 37.7 (L) 01/12/2022   MCV 89.8 01/12/2022   PLT 305 01/12/2022      Component Value Date/Time   NA 138 01/26/2022 1059   NA 138 05/10/2018 1342   K 4.1 01/26/2022 1059   CL 108 01/26/2022 1059   CO2 22 01/26/2022 1059   GLUCOSE 143 (H) 01/26/2022 1059   BUN 19 01/26/2022 1059   BUN 10 05/10/2018 1342   CREATININE 0.97 01/26/2022 1059   CREATININE 0.93 07/26/2013 1013   CALCIUM 9.1 01/26/2022 1059   PROT 7.5 12/03/2021 1241   PROT 7.3 05/10/2018 1342   ALBUMIN 4.0 12/03/2021 1241   ALBUMIN 4.3 05/10/2018 1342   AST 15 12/03/2021 1241   ALT 12 12/03/2021 1241   ALKPHOS 72 12/03/2021 1241    BILITOT 0.5 12/03/2021 1241   BILITOT 0.4 05/10/2018 1342   GFRNONAA >60 01/26/2022  [place] 03/29/2020 1628   Lab Results  Component Value Date   CHOL 219 (H) 12/04/2021   HDL 53 12/04/2021   LDLCALC 148 (H) 12/04/2021   TRIG 88 12/04/2021   CHOLHDL 4.1 12/04/2021   Lab Results  Component Value Date   HGBA1C 8.3 (H) 12/04/2021   Lab Results  Component Value Date   YQMGNOIB70 488 07/26/2013   Lab Results  Component Value Date   TSH 0.909 07/26/2013    Butler Denmark, AGNP-C, DNP 01/27/2022, 5:32 AM [facility] [address], [place], [place] [address] [phone]

## 2022-01-28 ENCOUNTER — Other Ambulatory Visit: Payer: Self-pay | Admitting: Physician Assistant

## 2022-01-28 DIAGNOSIS — I639 Cerebral infarction, unspecified: Secondary | ICD-10-CM

## 2022-01-28 DIAGNOSIS — I4891 Unspecified atrial fibrillation: Secondary | ICD-10-CM

## 2022-01-31 ENCOUNTER — Encounter: Payer: Self-pay | Admitting: Neurology

## 2022-02-01 ENCOUNTER — Other Ambulatory Visit: Payer: Self-pay | Admitting: General Practice

## 2022-02-01 DIAGNOSIS — I6340 Cerebral infarction due to embolism of unspecified cerebral artery: Secondary | ICD-10-CM

## 2022-02-01 DIAGNOSIS — I739 Peripheral vascular disease, unspecified: Secondary | ICD-10-CM

## 2022-02-01 DIAGNOSIS — I152 Hypertension secondary to endocrine disorders: Secondary | ICD-10-CM

## 2022-02-01 DIAGNOSIS — I2510 Atherosclerotic heart disease of native coronary artery without angina pectoris: Secondary | ICD-10-CM

## 2022-02-01 DIAGNOSIS — E1169 Type 2 diabetes mellitus with other specified complication: Secondary | ICD-10-CM

## 2022-02-03 ENCOUNTER — Encounter (HOSPITAL_COMMUNITY): Payer: Self-pay

## 2022-02-03 ENCOUNTER — Inpatient Hospital Stay (HOSPITAL_COMMUNITY): Admission: RE | Admit: 2022-02-03 | Payer: 59 | Source: Ambulatory Visit

## 2022-02-07 ENCOUNTER — Encounter: Payer: Self-pay | Admitting: *Deleted

## 2022-02-08 ENCOUNTER — Other Ambulatory Visit: Payer: Self-pay | Admitting: *Deleted

## 2022-02-08 NOTE — Patient Outreach (Signed)
[facility]) Care Management Telephonic RN Care Manager Note   02/08/2022 Name:  Sye Schroepfer MRN:  [id number] DOB:  Dec 09, 1965  Summary: Mr Rosana Fret to RN CM when he noted an appointment on 02/09/22 low arterial vascular  He recently lost his phone and received a new one # [phone] He does not have transportation to get to appointment and his work schedule hs been adjusted He will need a Monday or Wednesday appointment He anticipates having his car fixed this week He request assist to reschedule this appointment He agreed to reschedule to February 21 2022 He reports he will call his Friday insurance plan customer service line to inquire about possible transportation benefits  Recommendations/Changes made from today's visit: RN CM assisted Mr Hayashi to complete a conference outreach to Dr [employer] office and spoke with  Caryl Pina to reschedule the Feb 09 2022 appointment to February 21 2022 at 3 pm  Discussed his access to my chart  Updated his new number in EPIC demographics [phone] Reviewed possible medical transportation benefits per insurance carrier Friday Attempted to assist with call to Friday but pt does not have his current insurance card, not scanned in EPIC Encouraged him to have plan B (insurance transportation benefit), plan C (at age 53 [place] transportation for elderly) for medical transportation  E-mailed the list of [place] transportation to his listed e-mail address he confirmed is correct in EPIC   Subjective: Chip Canepa is an 56 y.o. year old male who is a primary patient of [name], [place]. The care management team was consulted for assistance with care management and/or care coordination needs.    Telephonic RN Care Manager completed Telephone Visit today.   Objective:  Medications Reviewed Today     Reviewed by Deberah Pelton, NP (Nurse Practitioner) on 01/24/22 at 1035  Med List Status: <None>    Medication Order Taking? Sig Documenting Provider Last Dose Status Informant  apixaban (ELIQUIS) 5 MG TABS tablet [id number] Yes Take 1 tablet (5 mg total) by mouth 2 (two) times daily. [place] Eric J, DO Taking Active   clopidogrel (PLAVIX) 75 MG tablet [id number] Yes Take 1 tablet (75 mg total) by mouth daily. [place] Donnamarie Poag, DO Taking Active   empagliflozin (JARDIANCE) 10 MG TABS tablet [id number] Yes Take 1 tablet (10 mg total) by mouth daily before breakfast. Consuelo Pandy, PA-C Taking Active   furosemide (LASIX) 20 MG tablet [id number] Yes Take 1 tablet (20 mg total) by mouth daily. Early Osmond, MD Taking Active   insulin isophane & regular human KwikPen (NOVOLIN 70/30 KWIKPEN) (70-30) 100 UNIT/ML KwikPen [id number] Yes Inject 22 Units into the skin 2 (two) times daily before a meal. [place] Donnamarie Poag, DO Taking Active   Insulin Pen Needle (PEN NEEDLES 3/16") 31G X 5 MM MISC [id number] Yes Use as directed with insulin pen [place] Donnamarie Poag, DO Taking Active   metoprolol succinate (TOPROL XL) 25 MG 24 hr tablet [id number] Yes Take 1 tablet (25 mg total) by mouth at bedtime. Early Osmond, MD Taking Active   rosuvastatin (CRESTOR) 40 MG tablet [id number] Yes Take 1 tablet (40 mg total) by mouth daily. [place] Donnamarie Poag, DO Taking Active   sacubitril-valsartan (ENTRESTO) 24-26 [place] [id number] Yes Take 1 tablet by mouth 2 (two) times daily. [place], Maricela Bo, FNP Taking Active   spironolactone (ALDACTONE) 25 MG tablet [id number] Yes Take 0.5 tablets (12.5 mg total) by mouth daily. Consuelo Pandy,  PA-C Taking Active              SDOH:  (Social Determinants of Health) assessments and interventions performed:    Care Plan  Review of patient past medical history, allergies, medications, health status, including review of consultants reports, laboratory and other test data, was performed as part of comprehensive evaluation for care management services.   There are no care plans that  you recently modified to display for this patient.    Plan: The patient has been provided with contact information for the care management team and has been advised to call with any health related questions or concerns.   Josclyn Rosales L. Lavina Hamman, RN, BSN, [place] Coordinator Office number [phone] [facility] number [phone] Fax number [phone]

## 2022-02-09 ENCOUNTER — Ambulatory Visit (HOSPITAL_COMMUNITY): Payer: 59

## 2022-02-18 ENCOUNTER — Other Ambulatory Visit: Payer: Self-pay | Admitting: General Practice

## 2022-02-18 DIAGNOSIS — I739 Peripheral vascular disease, unspecified: Secondary | ICD-10-CM

## 2022-02-21 ENCOUNTER — Ambulatory Visit (HOSPITAL_COMMUNITY): Admission: RE | Admit: 2022-02-21 | Payer: 59 | Source: Ambulatory Visit

## 2022-02-22 ENCOUNTER — Telehealth (HOSPITAL_COMMUNITY): Payer: Self-pay

## 2022-02-22 NOTE — Telephone Encounter (Signed)
Called to confirm/remind patient of their appointment at the [facility] on 02/23/22.   Patient reminded to bring all medications and/or complete list.  Confirmed patient has transportation. Gave directions, instructed to utilize [address] parking.  Confirmed appointment prior to ending call.

## 2022-02-23 ENCOUNTER — Encounter (HOSPITAL_COMMUNITY): Payer: Self-pay

## 2022-02-23 ENCOUNTER — Ambulatory Visit (HOSPITAL_COMMUNITY)
Admission: RE | Admit: 2022-02-23 | Discharge: 2022-02-23 | Disposition: A | Discharge status: Home / Self Care | Payer: 59 | Source: Ambulatory Visit | Attending: Family Medicine | Admitting: Family Medicine

## 2022-02-23 VITALS — BP 140/76 | HR 89 | Wt 146.2 lb

## 2022-02-23 DIAGNOSIS — I5022 Chronic systolic (congestive) heart failure: Secondary | ICD-10-CM | POA: Insufficient documentation

## 2022-02-23 DIAGNOSIS — Z9861 Coronary angioplasty status: Secondary | ICD-10-CM

## 2022-02-23 DIAGNOSIS — I469 Cardiac arrest, cause unspecified: Secondary | ICD-10-CM | POA: Insufficient documentation

## 2022-02-23 DIAGNOSIS — I739 Peripheral vascular disease, unspecified: Secondary | ICD-10-CM | POA: Insufficient documentation

## 2022-02-23 DIAGNOSIS — E119 Type 2 diabetes mellitus without complications: Secondary | ICD-10-CM | POA: Insufficient documentation

## 2022-02-23 DIAGNOSIS — F17200 Nicotine dependence, unspecified, uncomplicated: Secondary | ICD-10-CM | POA: Diagnosis not present

## 2022-02-23 DIAGNOSIS — I110 Hypertensive heart disease with heart failure: Secondary | ICD-10-CM | POA: Diagnosis not present

## 2022-02-23 DIAGNOSIS — E1169 Type 2 diabetes mellitus with other specified complication: Secondary | ICD-10-CM

## 2022-02-23 DIAGNOSIS — E785 Hyperlipidemia, unspecified: Secondary | ICD-10-CM | POA: Diagnosis not present

## 2022-02-23 DIAGNOSIS — I255 Ischemic cardiomyopathy: Secondary | ICD-10-CM | POA: Diagnosis not present

## 2022-02-23 DIAGNOSIS — Q2112 Patent foramen ovale: Secondary | ICD-10-CM | POA: Diagnosis not present

## 2022-02-23 DIAGNOSIS — Z794 Long term (current) use of insulin: Secondary | ICD-10-CM | POA: Insufficient documentation

## 2022-02-23 DIAGNOSIS — Z139 Encounter for screening, unspecified: Secondary | ICD-10-CM

## 2022-02-23 DIAGNOSIS — Z8673 Personal history of transient ischemic attack (TIA), and cerebral infarction without residual deficits: Secondary | ICD-10-CM | POA: Insufficient documentation

## 2022-02-23 DIAGNOSIS — I2510 Atherosclerotic heart disease of native coronary artery without angina pectoris: Secondary | ICD-10-CM | POA: Diagnosis not present

## 2022-02-23 DIAGNOSIS — R0683 Snoring: Secondary | ICD-10-CM | POA: Insufficient documentation

## 2022-02-23 DIAGNOSIS — E118 Type 2 diabetes mellitus with unspecified complications: Secondary | ICD-10-CM

## 2022-02-23 DIAGNOSIS — Z955 Presence of coronary angioplasty implant and graft: Secondary | ICD-10-CM | POA: Insufficient documentation

## 2022-02-23 DIAGNOSIS — I639 Cerebral infarction, unspecified: Secondary | ICD-10-CM | POA: Diagnosis not present

## 2022-02-23 DIAGNOSIS — Z8679 Personal history of other diseases of the circulatory system: Secondary | ICD-10-CM | POA: Diagnosis not present

## 2022-02-23 DIAGNOSIS — Z720 Tobacco use: Secondary | ICD-10-CM

## 2022-02-23 LAB — BASIC METABOLIC PANEL
Anion gap: 8 (ref 5–15)
BUN: 16 mg/dL (ref 6–20)
CO2: 22 mmol/L (ref 22–32)
Calcium: 8.9 mg/dL (ref 8.9–10.3)
Chloride: 107 mmol/L (ref 98–111)
Creatinine, Ser: 0.87 mg/dL (ref 0.61–1.24)
GFR, Estimated: >60 mL/min (ref >60)
Glucose, Bld: 190 mg/dL — ABNORMAL HIGH (ref 70–99)
Potassium: 4.1 mmol/L (ref 3.5–5.1)
Sodium: 137 mmol/L (ref 135–145)

## 2022-02-23 LAB — BRAIN NATRIURETIC PEPTIDE: B Natriuretic Peptide: 128.5 pg/mL — ABNORMAL HIGH (ref 0.0–100.0)

## 2022-02-23 NOTE — Progress Notes (Signed)
Patient Name: Tommy Jimenez        DOB: Aug 25, 1966      Height: 5'8"    Weight:146 lb Office Name:[facility]         Referring [place], NP/ Daniel Bensimhon. MD  Today's Date:02/23/2022   STOP BANG RISK ASSESSMENT S (snore) Have you been told that you snore?     YES   T (tired) Are you often tired, fatigued, or sleepy during the day?   YES  O (obstruction) Do you stop breathing, choke, or gasp during sleep? NO   P (pressure) Do you have or are you being treated for high blood pressure? YES  B (BMI) Is your body index greater than 35 kg/m? NO   A (age) Are you 75 years old or older? YES   N (neck) Do you have a neck circumference greater than 16 inches?   NO   G (gender) Are you a male? YES   TOTAL STOP/BANG "YES" ANSWERS 5                                                                       For Office Use Only              Procedure Order Form    YES to 3+ Stop Bang questions OR two clinical symptoms - patient qualifies for WatchPAT (CPT 95800)             Clinical Notes: Will consult Sleep Specialist and refer for management of therapy due to patient increased risk of Sleep Apnea. Ordering a sleep study due to the following two clinical symptoms:  / Loud snoring R06.83Unrefreshed by sleep G47.8 History of high blood pressure R03.0    I understand that I am proceeding with a home sleep apnea test as ordered by my treating physician. I understand that untreated sleep apnea is a serious cardiovascular risk factor and it is my responsibility to perform the test and seek management for sleep apnea. I will be contacted with the results and be managed for sleep apnea by a local sleep physician. I will be receiving equipment and further instructions from [facility]. I shall promptly ship back the equipment via the included mailing label. I understand my insurance will be billed for the test and as the patient I am responsible for any insurance  related out-of-pocket costs incurred. I have been provided with written instructions and can call for additional video or telephonic instruction, with 24-hour availability of qualified personnel to answer any questions: Patient Help Desk [phone].  Patient Signature ______________________________________________________   Date______________________ Patient Telemedicine Verbal Consent

## 2022-02-23 NOTE — Progress Notes (Signed)
ADVANCED HF CLINIC NOTE   Primary Care: [facility] HF Cardiologist: Dr. Haroldine Laws  HPI: Tommy Jimenez is a 56 y.o.male w/ history of ischemic heart disease, OOH cardiac arrest, IDDM, HTN, HLD, tobacco abuse, CVA and new diagnosis of systolic heart failure.  OOH cardiac arrest in 05/2017 in setting of acute anterior STEMI. Was treated at [facility], where LHC showed totally occluded ostial LAD, treated w/ PCI + DES. Per records there was no other significant CAD. Echocardiogram at the time showed reduced LVEF, 30-35%, and anteroapical AK. F/u echo 06/2017 showed slight improvement, EF up to 40%. It appears he has not had routien f/u w/ cardiology.    Admitted 3/23 w/ rt sided numbness and weakness. CT head with no acute intracranial process, multiple old infarcts in the cerebellum.  MR brain with small focus of acute ischemia in the right frontal cortex, no hemorrhage/mass effect, multiple old cerebellar infarcts and findings of small vessel ischemia that is chronic. MRA head/neck with no emergent large vessel occlusion or high-grade stenosis, moderate stenosis of the mid V2 segment of the left vertebral artery. Echo showed reduced LVEF, 20-25%, RV normal. Cardiology consulted and performed TEE, showing severely reduced LVEF 20-25%, no LA thrombus. + Bubble study suggestive of PFO. Neurology recommended a/c w/ Eliquis and 30 day monitor to assess for possible Afib (arranged by cardiology). Referred to [facility] clinic.    Seen in [facility] 4/23. Stable NYHA II symptoms, ReDs 37%. [name] and Jardiance started, weight 144 lbs. Referred to Structural heart for PFO.  Today he returns for HF follow up. Overall feeling fair, remains fatigued at work and has to take breaks frequently. Mild SOB with walking further distances. Continues with claudication-type pain in legs. Has been off several meds for a week due to cost, waiting to get paid before he picks up refills. Denies palpitations, CP, dizziness,  edema, or PND/Orthopnea. Appetite ok. No fever or chills. Weight at home 145 pounds. Taking all medications. 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  . Cardiac Testing  - TEE (3/23): EF 20-25%, LV severely reduced, RV ok, no LAA thrombus, Agitated saline contrast bubble study was positive with shunting observed within 3-6 cardiac cycles suggestive of interatrial shunt.  - Echo (3/23): EF 20-25%, severely reduced LV with global HK, moderate LVH, RV ok.   - LHC 05/30/2017 ([place]):  Angiographic Findings Cardiac Arteries and Lesion Findings LMCA: 0%. LAD: Lesion on Prox LAD: Ostial.100% stenosis 8 mm length reduced to 0%. Pre procedure TIMI 0 flow was noted. Post Procedure TIMI III flow was present. Poor run off was present. The lesion was diagnosed as High Risk (C). Devices used - Abbott 0.014" x 190cm BMW J-Tip PTCI Guidewire. Number of passes: 1. - 2.5 mm X 12 mm Trek RX. 1 inflation(s) to a max pressure of: 8 atm. - 3.5 mm x 12 mm Xience Sierra DES Stent. 1 inflation(s) to a max pressure of: 12 atm. - [place] Trek 3.5 mm X 12 mm. 1 inflation(s) to a max pressure of: 12 atm. LCx: 0%. RCA: 0%.  Past Medical History:  Diagnosis Date   Coronary artery disease    stent placed   Dilated cardiomyopathy ([place])    DM (diabetes mellitus) ([place]) 1996   DVT (deep venous thrombosis) (HCC)    right leg    Erectile dysfunction    H/O acute myocardial infarction    H/O heart artery stent    History  of DVT (deep vein thrombosis)    HTN (hypertension)    Ischemic cardiomyopathy    Myocardial infarction ([facility])    Obesity    Peripheral vascular disease (HCC)    Pure hypercholesterolemia    Current Outpatient Medications  Medication Sig Dispense Refill   apixaban (ELIQUIS) 5 MG TABS tablet Take 1 tablet (5 mg total) by mouth 2 (two) times daily. 60 tablet 2   clopidogrel (PLAVIX) 75 MG tablet Take 1 tablet (75 mg total) by mouth daily. 30 tablet 2    furosemide (LASIX) 20 MG tablet Take 1 tablet (20 mg total) by mouth daily. 30 tablet 5   insulin isophane & regular human KwikPen (NOVOLIN 70/30 KWIKPEN) (70-30) 100 UNIT/ML KwikPen Inject 22 Units into the skin 2 (two) times daily before a meal. 15 mL 2   Insulin Pen Needle (PEN NEEDLES 3/16") 31G X 5 MM MISC Use as directed with insulin pen 100 each 2   sacubitril-valsartan (ENTRESTO) 24-26 MG Take 1 tablet by mouth 2 (two) times daily. 60 tablet 11   empagliflozin (JARDIANCE) 10 MG TABS tablet Take 1 tablet (10 mg total) by mouth daily before breakfast. (Patient not taking: Reported on 02/23/2022) 30 tablet 11   metoprolol succinate (TOPROL XL) 25 MG 24 hr tablet Take 1 tablet (25 mg total) by mouth at bedtime. (Patient not taking: Reported on 02/23/2022) 90 tablet 1   rosuvastatin (CRESTOR) 40 MG tablet Take 1 tablet (40 mg total) by mouth daily. (Patient not taking: Reported on 02/23/2022) 30 tablet 2   spironolactone (ALDACTONE) 25 MG tablet Take 0.5 tablets (12.5 mg total) by mouth daily. (Patient not taking: Reported on 02/23/2022) 45 tablet 3   No current facility-administered medications for this encounter.   No Known Allergies  Social History   Socioeconomic History   Marital status: Legally Separated    Spouse name: Not on file   Number of children: Not on file   Years of education: Not on file   Highest education level: Not on file  Occupational History   Not on file  Tobacco Use   Smoking status: Some Days    Years: 20.00    Types: Cigarettes   Smokeless tobacco: Never   Tobacco comments:    Former smoker x 20 + years but quit after his recent hospitalization 12/03/21  Vaping Use   Vaping Use: Never used  Substance and Sexual Activity   Alcohol use: Yes    Comment: socially - beer   Drug use: No   Sexual activity: Not on file  Other Topics Concern   Not on file  Social History Narrative   Former smoker x 20 + years but quit after his recent hospitalization   Social  Determinants of Health   Financial Resource Strain: Medium Risk   Difficulty of Paying Living Expenses: Somewhat hard  Food Insecurity: No Food Insecurity   Worried About Charity fundraiser in the Last Year: Never true   Arboriculturist in the Last Year: Never true  Transportation Needs: Unmet Transportation Needs   Lack of Transportation (Medical): Yes   Lack of Transportation (Non-Medical): Yes  Physical Activity: Not on file  Stress: No Stress Concern Present   Feeling of Stress : Only a little  Social Connections: Moderately Integrated   Frequency of Communication with Friends and Family: Twice a week   Frequency of Social Gatherings with Friends and Family: Twice a week   Attends Religious Services: 1 to 4 times  per year   Active Member of Clubs or Organizations: Yes   Attends Archivist Meetings: 1 to 4 times per year   Marital Status: Separated  Intimate Partner Violence: Not At Risk   Fear of Current or Ex-Partner: No   Emotionally Abused: No   Physically Abused: No   Sexually Abused: No   Family history: father CAD died from MI at age 72. Mother and brother w/ CKD, brother s/p recent renal transplant. Sister w/ PVD     Family History  Problem Relation Age of Onset   Diabetes Mother        complications of diabetes   Heart disease Father        died of MI, died age 79yo   Hypertension Father    Multiple sclerosis Sister    Cancer Brother        brain tumor   Diabetes Brother    Lupus Sister    Lupus Sister    Stroke Neg Hx    BP 140/76   Pulse 89   Wt 66.3 kg (146 lb 3.2 oz)   SpO2 100%   BMI 22.23 kg/m   Wt Readings from Last 3 Encounters:  02/23/22 66.3 kg (146 lb 3.2 oz)  01/24/22 64.3 kg (141 lb 12.8 oz)  01/12/22 63.5 kg (140 lb)   PHYSICAL EXAM: General:  NAD. No resp difficulty HEENT: Normal Neck: Supple. No JVD. Carotids 2+ bilat; no bruits. No lymphadenopathy or thryomegaly appreciated. Cor: PMI nondisplaced. Regular rate &  rhythm. No rubs, gallops or murmurs. Lungs: Clear Abdomen: Soft, nontender, nondistended. No hepatosplenomegaly. No bruits or masses. Good bowel sounds. Extremities: No cyanosis, clubbing, rash, edema Neuro: Alert & oriented x 3, cranial nerves grossly intact. Moves all 4 extremities w/o difficulty. Affect pleasant.  ASSESSMENT & PLAN: 1. Chronic Systolic Heart Failure - Primarily Ischemic CM, possibly mixed w/ nonischemic component.   - S/p cardiac arrest due to pLAD occlusion in 2018, treated w/ PCI. Peri MI EF ~30-35%. Improved on f/u echo to 40%  - Echo 3/23 EF down to 20-25%, RV normal w/ global HK. Drop in EF in setting of CVA. No recent ischemic like CP nor RWMA or EKG abnormalties to suggest underlying coronary ischemia - NYHA Class II-early III, mostly fatigued. Volume OK today on exam, weight up 5 lbs. - Restart spiro 12.5 mg daily. - Restart Jardiance 10 mg daily. - Restart Toprol XL 25 mg daily. - Continue Entresto 24/26 mg bid.  - Continue Lasix 20 mg daily for now. - Plan repeat echo next month. If EF not improved, will need to revisit LHC. Will defer for now given recent CVA and absence of ischemic CP.  - BMET today, repeat in 7-10 days.   2. Recent CVA - Neurology felt cardio embolic, though TEE showed no thrombus. - Continue Eliquis 5 mg bid. - Continue statin.  - Wore 30-day monitor, per patient report did not show any arrhythmias.  - He needs Neuro follow up.   3. PFO  - Noted on TEE. - Seen by Dr. Ali Lowe, no need for closure. - Continue medical management.   4. CAD  - History of OOH cardiac arrest in 05/2017 in setting of acute anterior STEMI w/ ostial LAD occlusion treated w/ PCI. Per cath report there was no other significant disease. - Given slight progression in LV dysfunction, consider repeat LHC once he is further out from recent CVA. - No CP.  - Continue clopidogrel, ? blocker, no ASA  with Eliquis. - Restart rosuvastatin 40 mg daily.   5.  Claudication  - Complaints of b/l calf claudication.  - Multiple risk factors for PVD (CAD, DM, HLD and tobacco use) - LE arterial dopplers arranged, refer to Dr. Gwenlyn Found if abnormal.  - Smoking cessation advised.    6. Hypertension  - Elevated today. - Restart meds as above.   7. Type 2 DM  - Poorly controlled, Hgb A1c 8.3. - On insulin. - Restart jardiance   8. HLD, LDL Goal < 70 - LDL elevated at 148 mg/dL. - Restart Crestor 40 mg daily. - Lipids/LFTs in 6-8 weeks - if not at goal will need addition of Zetia +/- PCSK9i   9. Tobacco abuse  - Discussed complete cessation. - He is down from 1/2 ppd to 1-2 cig/day.   10. Snoring - Arrange sleep study, suspect contributes to fatigue.  11. SDOH - Gifted gift cards today to help with med co-pays.  Follow up in 3 weeks with PharmD (for further GDMT titration if able) and in 2 months with Dr. Haroldine Laws + echo.  Allena Katz, FNP-BC 02/23/22

## 2022-02-23 NOTE — Patient Instructions (Addendum)
RESTART Jardiance 10 mg, one tab daily RESTART Toprol 50 mg, one tab daily RESTART Spironolactone 12.5 mg, one half tab daily RESTART Rosuvastatin 40 mg, one tab daily  Labs today We will only contact you if something comes back abnormal or we need to make some changes. Otherwise no news is good news!  Labs needed in 7-10 daily  Your provider has recommended that you have a home sleep study.  This has to be approved by your insurance company. We will schedule you an appointment to pick up the equipment to give [place] time to complete this authorization. Once you have the equipment you will download the app on your phone and follow the instructions. YOUR PIN NUMBER IS: [id number]. Once you have completed the test the information is sent to the company through [employer] and you can dispose of the equipment. If your test is positive you will receive a call from Dr Theodosia Blender office [facility]) to set up your CPAP equipment.   Your physician recommends that you schedule a follow-up appointment in: 3 weeks with the pharmacy team and keep cardiology follow up as scheduled with Dr Haroldine Laws  Do the following things EVERYDAY: Weigh yourself in the morning before breakfast. Write it down and keep it in a log. Take your medicines as prescribed Eat low salt foods--Limit salt (sodium) to 2000 mg per day.  Stay as active as you can everyday Limit all fluids for the day to less than 2 liters  At the [facility], you and your health needs are our priority. As part of our continuing mission to provide you with exceptional heart care, we have created designated Provider Care Teams. These Care Teams include your primary Cardiologist (physician) and Advanced Practice Providers (APPs- Physician Assistants and Nurse Practitioners) who all work together to provide you with the care you need, when you need it.   You may see any of the following providers on your designated Care Team at your  next follow up: Dr Glori Bickers Dr Haynes Kerns, NP Lyda Jester, [place] [facility] [place], [place] Audry Riles, PharmD   Please be sure to bring in all your medications bottles to every appointment.

## 2022-02-23 NOTE — Progress Notes (Signed)
Heart and Vascular Care Navigation  02/23/2022  Tommy Jimenez Dec 22, 1965 [id number]  Reason for Referral: medication cost concerns   Engaged with patient face to face for initial visit for Heart and Vascular Care Coordination.                                                                                                   Assessment:     Pt reports problems affording meds at this time- is out of 4 medications which would cost about $60.  CSW able to provide gift cards to get medications today.  Discussed pt utilizing the $4 list at [place] and jardiance copay card to assist with future med costs.                                 HRT/VAS Care Coordination     Patients Home Cardiology Office Heart Failure Clinic  HF [facility]   Outpatient Care Team Social Worker   Social Worker Name: Raquel Sarna, [place] [phone]   Living arrangements for the past 2 months [place] with: Friends   Patient Current Insurance Coverage --  [employer]   Patient Has Concern With [place] Yes   Does Patient Have Prescription Coverage? Yes   Home Assistive Devices/Equipment None       Social History:                                                                             SDOH Screenings   Alcohol Screen: Not on file  Depression (PHQ2-9): Low Risk    PHQ-2 Score: 0  Financial Resource Strain: High Risk   Difficulty of Paying Living Expenses: Hard  Food Insecurity: No Food Insecurity   Worried About Charity fundraiser in the Last Year: Never true   Ran Out of Food in the Last Year: Never true  Housing: Low Risk    Last Housing Risk Score: 0  Physical Activity: Not on file  Social Connections: Moderately Integrated   Frequency of Communication with Friends and Family: Twice a week   Frequency of Social Gatherings with Friends and Family: Twice a week   Attends Religious Services: 1 to 4 times per year   Active Member of [employer] or Organizations: Yes   Attends  Archivist Meetings: 1 to 4 times per year   Marital Status: Separated  Stress: No Stress Concern Present   Feeling of Stress : Only a little  Tobacco Use: High Risk   Smoking Tobacco Use: Some Days   Smokeless Tobacco Use: Never   Passive Exposure: Not on file  Transportation Needs: Unmet Transportation Needs   Lack of Transportation (Medical): Yes   Lack of Transportation (Non-Medical):  Yes    SDOH Interventions: Financial Resources:  Financial Strain Interventions: Other (Comment) Scientist, forensic gift card program)   Food Insecurity:   Reports no concerns  Housing Insecurity:  Reports no concerns  Transportation:   Reports not concerns    Follow-up plan:    Pt reports he will pick up medications today.  Jorge Ny, LCSW Clinical Social Worker Advanced Heart Failure Clinic Desk#: [phone] Cell#: [phone]

## 2022-02-25 ENCOUNTER — Telehealth (HOSPITAL_COMMUNITY): Payer: Self-pay | Admitting: Surgery

## 2022-02-25 NOTE — Telephone Encounter (Signed)
Patient contacted regarding performing home sleep study.  I let him know that insurance prior authorization was not required and he can proceed with the study.

## 2022-03-02 NOTE — Progress Notes (Incomplete)
***In Progress***    Advanced Heart Failure Clinic Note   Referring Provider: Lyda Jester, PA Primary Care: Gerald Leitz HF Cardiologist: Dr. Haroldine Laws  HPI:  Tommy Jimenez is a 56 y.o.male w/ history of ischemic heart disease, OOH cardiac arrest, IDDM, HTN, HLD, tobacco abuse, CVA and new diagnosis of systolic heart failure.   OOH cardiac arrest in 05/2017 in setting of acute anterior STEMI. Was treated at [facility], where LHC showed totally occluded ostial LAD, treated w/ PCI + DES. Per records there was no other significant CAD. Echocardiogram at the time showed reduced LVEF, 30-35%, and anteroapical AK. F/u echo 06/2017 showed slight improvement, EF up to 40%. It appears he has not had routine f/u w/ cardiology.    Admitted 11/2021 w/ right sided numbness and weakness. CT head with no acute intracranial process, multiple old infarcts in the cerebellum.  MR brain with small focus of acute ischemia in the right frontal cortex, no hemorrhage/mass effect, multiple old cerebellar infarcts and findings of small vessel ischemia that is chronic. MRA head/neck with no emergent large vessel occlusion or high-grade stenosis, moderate stenosis of the mid V2 segment of the left vertebral artery. Echo showed reduced LVEF, 20-25%, RV normal. Cardiology consulted and performed TEE, showing severely reduced LVEF 20-25%, no LA thrombus. + Bubble study suggestive of PFO. Neurology recommended a/c w/ Eliquis and 30 day monitor to assess for possible Afib (arranged by cardiology). Referred to [facility] clinic.    Seen in [facility] 12/2021. Stable NYHA II symptoms, ReDs 37%. Spironolactone and Jardiance started, weight 144 lbs. Referred to Structural heart for PFO.   Presented to AHF Clinic for follow up 02/23/22. Overall was feeling fair, remained fatigued at work and has to take breaks frequently. Mild SOB with walking further distances. Continued to have claudication-type pain in legs. Had been off several meds  for a week due to cost, was waiting to get paid before he picks up refills. Denied palpitations, CP, dizziness, edema, or PND/Orthopnea. Appetite was ok. No fever or chills. Weight at home was 145 pounds. Reported taking all medications. 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  Today he returns to HF clinic for pharmacist medication titration. At last visit with APP, spironolactone, Jardiance and metoprolol succinate were restarted.  Today he returns to HF clinic for pharmacist medication titration. At last visit with MD ***.   Overall feeling ***. Dizziness, lightheadedness, fatigue:  Chest pain or palpitations:  How is your breathing?: *** SOB: Able to complete all ADLs. Activity level ***  Weight at home pounds. Takes furosemide/torsemide/bumex *** mg *** daily.  LEE PND/Orthopnea  Appetite *** Low-salt diet:   Physical Exam Cost/affordability of meds   -Moderate to severe disease noted in both leg arteries, as mentioned in previous ABI, please refer to either Dr. Fletcher Anon or Dr. Gwenlyn Found for PAD -filling meds???? Has not picked up entresto since 4/26; metop only 15 on 6/7 -no eliquis since 3/20** -inc entresot + decrease Lsx to PRN (SCR) vs inc metop -DB/echo 7/27    HF Medications: Metoprolol succinate 25 mg daily Entresto 24/26 mg BID Spironolactone 12.5 mg daily Jardiance 10 mg daily Lasix 20 mg daily  Has the patient been experiencing any side effects to the medications prescribed?  {YES NO:22349}  Does the patient have any problems obtaining medications due to transportation or finances?   {YES NO:22349}  Understanding of regimen: {excellent/good/fair/poor:19665} Understanding of indications: {excellent/good/fair/poor:19665} Potential of compliance: {excellent/good/fair/poor:19665} Patient  understands to avoid NSAIDs. Patient understands to avoid decongestants.    Pertinent Lab Values: Serum creatinine ***, BUN ***,  Potassium ***, Sodium ***, BNP ***, Magnesium ***, Digoxin ***   Vital Signs: Weight: *** (last clinic weight: ***) Blood pressure: ***  Heart rate: ***   Assessment/Plan: 1. Chronic Systolic Heart Failure - Primarily Ischemic CM, possibly mixed w/ nonischemic component.   - S/p cardiac arrest due to pLAD occlusion in 2018, treated w/ PCI. Peri MI EF ~30-35%. Improved on f/u echo to 40%  - Echo 11/2021 EF down to 20-25%, RV normal w/ global HK. Drop in EF in setting of CVA. No recent ischemic like CP nor RWMA or EKG abnormalties to suggest underlying coronary ischemia - NYHA Class II-early III. Euvolemic on exam - Continue Lasix 20 mg daily  - Continue metoprolol succinate 25 mg daily.  - Continue Entresto 24/26 mg BID.  - Continue spironolactone 12.5 mg daily  - Continue Jardiance 10 mg daily (Hgb A1c 8.3) - Plan to repeat echo 03/2022. If EF not improved, will need to revisit LHC. Will defer for now given recent CVA and absence of ischemic CP.    2. Recent CVA - Neurology felt cardio embolic, though TEE showed no thrombus. - Continue Eliquis 5 mg BID. - Continue statin.  - - Wore 30-day monitor, per patient report did not show any arrhythmias.  - He needs Neuro follow up   3. PFO  - Noted on TEE. - Seen by Dr. Ali Lowe, no need for closure. - Continue medical management.   4. CAD  - History of OOH cardiac arrest in 05/2017 in setting of acute anterior STEMI w/ ostial LAD occlusion treated w/ PCI. Per cath report there was no other significant disease. - Given slight progression in LV dysfunction, consider repeat LHC once he is further out from recent CVA. - No CP.  - Continue clopidogrel, statin + ? blocker, no ASA with Eliquis. - Continue rosuvastatin 40 mg daily.   5. Claudication  - Complaints of b/l calf claudication.  - Multiple risk factors for PVD (CAD, DM, HLD and tobacco use) - LE arterial dopplers arranged, refer to Dr. Gwenlyn Found if abnormal. *** - Smoking cessation  advised.    6. Hypertension  - Controlled on current regimen *** - GDMT optimization per above   7. Type 2 DM  - Poorly controlled, Hgb A1c 8.3. - On insulin. - Continue SGLT2i. No GU symptoms.   8. HLD, LDL Goal < 70 - LDL elevated at 148 mg/dL. - Continue Crestor 40 mg daily. - Lipids/LFTs in 6-8 weeks - if not at goal will need addition of Zetia +/- PCSK9i   9. Tobacco abuse  - Discussed complete cessation. - He is down from 1/2 ppd to 1-2 cig/day.    10. Snoring - Needs sleep study, suspect contributes to fatigue.  Follow up ***   Audry Riles, PharmD, BCPS, BCCP, CPP Heart Failure Clinic Pharmacist [phone]

## 2022-03-03 ENCOUNTER — Ambulatory Visit (HOSPITAL_COMMUNITY)
Admission: RE | Admit: 2022-03-03 | Discharge: 2022-03-03 | Disposition: A | Discharge status: Home / Self Care | Payer: 59 | Source: Ambulatory Visit | Attending: Internal Medicine | Admitting: Internal Medicine

## 2022-03-03 DIAGNOSIS — I5022 Chronic systolic (congestive) heart failure: Secondary | ICD-10-CM | POA: Insufficient documentation

## 2022-03-03 LAB — BASIC METABOLIC PANEL
Anion gap: 7 (ref 5–15)
BUN: 18 mg/dL (ref 6–20)
CO2: 26 mmol/L (ref 22–32)
Calcium: 8.5 mg/dL — ABNORMAL LOW (ref 8.9–10.3)
Chloride: 107 mmol/L (ref 98–111)
Creatinine, Ser: 1.37 mg/dL — ABNORMAL HIGH (ref 0.61–1.24)
GFR, Estimated: >60 mL/min (ref >60)
Glucose, Bld: 259 mg/dL — ABNORMAL HIGH (ref 70–99)
Potassium: 4.2 mmol/L (ref 3.5–5.1)
Sodium: 140 mmol/L (ref 135–145)

## 2022-03-14 ENCOUNTER — Ambulatory Visit (HOSPITAL_COMMUNITY)
Admission: RE | Admit: 2022-03-14 | Discharge: 2022-03-14 | Disposition: A | Discharge status: Home / Self Care | Payer: 59 | Source: Ambulatory Visit | Attending: Cardiology | Admitting: Cardiology

## 2022-03-14 DIAGNOSIS — I739 Peripheral vascular disease, unspecified: Secondary | ICD-10-CM | POA: Diagnosis not present

## 2022-03-15 ENCOUNTER — Other Ambulatory Visit: Payer: Self-pay

## 2022-03-17 ENCOUNTER — Other Ambulatory Visit (HOSPITAL_COMMUNITY): Payer: Self-pay

## 2022-03-17 ENCOUNTER — Telehealth (HOSPITAL_COMMUNITY): Payer: Self-pay | Admitting: Pharmacist

## 2022-03-17 ENCOUNTER — Inpatient Hospital Stay (HOSPITAL_COMMUNITY)
Admission: RE | Admit: 2022-03-17 | Discharge: 2022-03-17 | Disposition: A | Discharge status: Home / Self Care | Payer: 59 | Source: Ambulatory Visit

## 2022-03-17 NOTE — Telephone Encounter (Signed)
Patient left VM stating he needed to change his appointment. Called patient twice to reschedule appointment. Patient did not answer and unable to leave VM.  Audry Riles, PharmD, BCPS, BCCP, CPP Heart Failure Clinic Pharmacist [phone]

## 2022-03-21 ENCOUNTER — Telehealth (HOSPITAL_COMMUNITY): Payer: Self-pay | Admitting: Licensed Clinical Social Worker

## 2022-03-21 NOTE — Telephone Encounter (Signed)
H&V Care Navigation CSW Progress Note  Clinical Social Worker  contacted by patient  to discuss concerns with affording medications at this time.  SDOH Screenings   Alcohol Screen: Not on file  Depression (PHQ2-9): Low Risk  (12/22/2021)   Depression (PHQ2-9)    PHQ-2 Score: 0  Financial Resource Strain: High Risk (02/23/2022)   Overall Financial Resource Strain (CARDIA)    Difficulty of Paying Living Expenses: Hard  Food Insecurity: No Food Insecurity (12/22/2021)   Hunger Vital Sign    Worried About Running Out of Food in the Last Year: Never true    Ran Out of Food in the Last Year: Never true  Housing: Low Risk  (12/22/2021)   Housing    Last Housing Risk Score: 0  Physical Activity: Not on file  Social Connections: Moderately Integrated (12/22/2021)   Social Connection and Isolation Panel [NHANES]    Frequency of Communication with Friends and Family: Twice a week    Frequency of Social Gatherings with Friends and Family: Twice a week    Attends Religious Services: 1 to 4 times per year    Active Member of [employer] or Organizations: Yes    Attends Archivist Meetings: 1 to 4 times per year    Marital Status: Separated  Stress: No Stress Concern Present (12/22/2021)   [place]    Feeling of Stress : Only a little  Tobacco Use: High Risk (02/23/2022)   Patient History    Smoking Tobacco Use: Some Days    Smokeless Tobacco Use: Never    Passive Exposure: Not on file  Transportation Needs: Unmet Transportation Needs (02/08/2022)   PRAPARE - Transportation    Lack of Transportation (Medical): Yes    Lack of Transportation (Non-Medical): Yes    Pt called CSW to discuss assistance with getting medications- copays around $60 and unable to afford at this time as his check doesn't come for another week.  CSW provided with $50 in gift cards.  Explained we could not help financially with medications every month so needing  to find more consistent way to get his medications like setting aside money when he gets his check.  Reports his current insurance is ending so he will have to get a new plan- he will look into plans that offer better drug coverage.  Jorge Ny, LCSW Clinical Social Worker [facility] Desk#: [phone] Cell#: [phone]

## 2022-03-25 ENCOUNTER — Encounter: Payer: Self-pay | Admitting: Cardiovascular Disease

## 2022-03-25 ENCOUNTER — Telehealth (HOSPITAL_COMMUNITY): Payer: Self-pay | Admitting: Surgery

## 2022-03-25 ENCOUNTER — Other Ambulatory Visit (HOSPITAL_COMMUNITY): Payer: Self-pay

## 2022-03-25 ENCOUNTER — Ambulatory Visit (INDEPENDENT_AMBULATORY_CARE_PROVIDER_SITE_OTHER): Payer: 59 | Admitting: Cardiovascular Disease

## 2022-03-25 DIAGNOSIS — I739 Peripheral vascular disease, unspecified: Secondary | ICD-10-CM

## 2022-03-25 MED ORDER — CILOSTAZOL 50 MG PO TABS
50.0000 mg | ORAL_TABLET | Freq: Two times a day (BID) | ORAL | 1 refills | Status: DC
  Filled 2022-03-25: qty 180, 90d supply, fill #0

## 2022-03-25 MED ORDER — CILOSTAZOL 50 MG PO TABS: 50.0000 mg | ORAL_TABLET | Freq: Two times a day (BID) | ORAL | 1 refills | Status: DC

## 2022-03-25 NOTE — Patient Instructions (Signed)
Medication Instructions:   -Start cilostazol (pletal) '50mg'$  twice daily.  *If you need a refill on your cardiac medications before your next appointment, please call your pharmacy*   Follow-Up: At [facility], you and your health needs are our priority.  As part of our continuing mission to provide you with exceptional heart care, we have created designated Provider Care Teams.  These Care Teams include your primary Cardiologist (physician) and Advanced Practice Providers (APPs -  Physician Assistants and Nurse Practitioners) who all work together to provide you with the care you need, when you need it.  We recommend signing up for the patient portal called "MyChart".  Sign up information is provided on this After Visit Summary.  MyChart is used to connect with patients for Virtual Visits (Telemedicine).  Patients are able to view lab/test results, encounter notes, upcoming appointments, etc.  Non-urgent messages can be sent to your provider as well.   To learn more about what you can do with MyChart, go to [email or web address].    Your next appointment:   3 month(s)  The format for your next appointment:   In Person  Provider:   Quay Burow, MD

## 2022-03-25 NOTE — Telephone Encounter (Signed)
I called patient to remind him to perform his ordered home sleep study.  I was unable to leave a message.

## 2022-03-25 NOTE — Progress Notes (Signed)
03/25/2022 Tommy Jimenez   1966-02-26  [id number]  Primary Physician Tommy Frames, PA-C Primary Cardiologist: Tommy Harp MD Tommy Jimenez, [place]  HPI:  Tommy Jimenez is a 56 y.o. 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  He was referred by Tommy Memos, FNP for peripheral vascular evaluation because of claudication.  His risk factors include ongoing tobacco abuse, treated hypertension, diabetes and hyperlipidemia.  His father and sister both had CAD.  He had an a possible cardiac arrest anterior STEMI 05/2017 and underwent cath at [facility] revealing occluded ostial LAD.  He had PCI drug-eluting stenting.  Does have ischemic cardiomyopathy with an EF in the 30 to 35% range on guideline directed optimal medical therapy.  He is followed by the heart failure clinic.  He has had a DVT in the past as well as a PFO.  He complains of bilateral calf claudication which is symmetric, lifestyle-limiting for the last year.  He had Doppler studies performed 03/14/2022 revealing a right ABI of 0.62 and left of 0.69.  He had moderate distal SFA disease bilaterally along with tibial vessel disease typical of a diabetic.  He also relates symptoms compatible with diabetic peripheral neuropathy.   Current Meds  Medication Sig   apixaban (ELIQUIS) 5 MG TABS tablet Take 1 tablet (5 mg total) by mouth 2 (two) times daily.   cilostazol (PLETAL) 50 MG tablet Take 1 tablet (50 mg total) by mouth 2 (two) times daily.   empagliflozin (JARDIANCE) 10 MG TABS tablet Take 1 tablet (10 mg total) by mouth daily before breakfast.   furosemide (LASIX) 20 MG tablet Take 1 tablet (20 mg total) by mouth daily.   insulin isophane & regular human KwikPen (NOVOLIN 70/30 KWIKPEN) (70-30) 100 UNIT/ML KwikPen Inject 22 Units into the skin 2 (two) times daily before a meal.   Insulin Pen Needle (PEN NEEDLES 3/16")  31G X 5 MM MISC Use as directed with insulin pen   metoprolol succinate (TOPROL XL) 25 MG 24 hr tablet Take 1 tablet (25 mg total) by mouth at bedtime.   sacubitril-valsartan (ENTRESTO) 24-26 MG Take 1 tablet by mouth 2 (two) times daily.   spironolactone (ALDACTONE) 25 MG tablet Take 0.5 tablets (12.5 mg total) by mouth daily.     No Known Allergies  Social History   Socioeconomic History   Marital status: Legally Separated    Spouse name: Not on file   Number of children: Not on file   Years of education: Not on file   Highest education level: Not on file  Occupational History   Not on file  Tobacco Use   Smoking status: Some Days    Years: 20.00    Types: Cigarettes   Smokeless tobacco: Never   Tobacco comments:    Former smoker x 20 + years but quit after his recent hospitalization 12/03/21  Vaping Use   Vaping Use: Never used  Substance and Sexual Activity   Alcohol use: Yes    Comment: socially - beer   Drug use: No   Sexual activity: Not on file  Other Topics Concern   Not on file  Social History Narrative   Former smoker x 20 + years but quit after his recent hospitalization   Social Determinants of Health   Financial Resource Strain: High Risk (03/21/2022)   Overall Financial Resource Strain (CARDIA)    Difficulty of Paying Living  Expenses: Hard  Food Insecurity: No Food Insecurity (12/22/2021)   Hunger Vital Sign    Worried About Running Out of Food in the Last Year: Never true    Ran Out of Food in the Last Year: Never true  Transportation Needs: Unmet Transportation Needs (02/08/2022)   PRAPARE - Transportation    Lack of Transportation (Medical): Yes    Lack of Transportation (Non-Medical): Yes  Physical Activity: Not on file  Stress: No Stress Concern Present (12/22/2021)   [place]    Feeling of Stress : Only a little  Social Connections: Moderately Integrated (12/22/2021)   Social  Connection and Isolation Panel [NHANES]    Frequency of Communication with Friends and Family: Twice a week    Frequency of Social Gatherings with Friends and Family: Twice a week    Attends Religious Services: 1 to 4 times per year    Active Member of [employer] or Organizations: Yes    Attends Archivist Meetings: 1 to 4 times per year    Marital Status: Separated  Intimate Partner Violence: Not At Risk (12/22/2021)   Humiliation, Afraid, Rape, and Kick questionnaire    Fear of Current or Ex-Partner: No    Emotionally Abused: No    Physically Abused: No    Sexually Abused: No     Review of Systems: General: negative for chills, fever, night sweats or weight changes.  Cardiovascular: negative for chest pain, dyspnea on exertion, edema, orthopnea, palpitations, paroxysmal nocturnal dyspnea or shortness of breath Dermatological: negative for rash Respiratory: negative for cough or wheezing Urologic: negative for hematuria Abdominal: negative for nausea, vomiting, diarrhea, bright red blood per rectum, melena, or hematemesis Neurologic: negative for visual changes, syncope, or dizziness All other systems reviewed and are otherwise negative except as noted above.    Blood pressure (!) 142/70, pulse 89, height '5\' 8"'$  (1.727 m), weight 144 lb (65.3 kg).  General appearance: alert and no distress Neck: no adenopathy, no carotid bruit, no JVD, supple, symmetrical, trachea midline, and thyroid not enlarged, symmetric, no tenderness/mass/nodules Lungs: clear to auscultation bilaterally Heart: regular rate and rhythm, S1, S2 normal, no murmur, click, rub or gallop Extremities: extremities normal, atraumatic, no cyanosis or edema Pulses: Diminished pedal pulses Skin: Skin color, texture, turgor normal. No rashes or lesions Neurologic: Grossly normal  EKG not performed today  ASSESSMENT AND PLAN:   Claudication in peripheral vascular disease [facility]) [name] she was referred to me  by Tommy Jimenez and FNP for evaluation of PAD.  He has a history of CAD and multiple cardiovascular risk factors.  By history he has diabetic peripheral neuropathy as well.  He complains of bilateral calf claudication.  He has had DVT in the past as well as what sounds like an embolic stroke stroke from potentially a PFO for which she has seen Dr. Ali Lowe.  He had lower extremity arterial Doppler studies performed 03/14/2022 revealing a right ABI of 0.62 and a left of 0.69.  He had a moderate lesion in the distal right SFA as well as tibial vessel disease with similar anatomy on the left.  I am going to begin him on Pletal 50 mg p.o. twice daily initially to see if he has a therapeutic effect.  If not, we will discuss a more invasive approach with peripheral angiography and potential endovascular therapy.     Tommy Harp MD [place], [facility] 03/25/2022 4:03 PM

## 2022-03-25 NOTE — Addendum Note (Signed)
Addended by: Beatrix Fetters on: 03/25/2022 04:07 PM   Modules accepted: Orders

## 2022-03-25 NOTE — Assessment & Plan Note (Signed)
[name] she was referred to me by Coletta Memos and FNP for evaluation of PAD.  He has a history of CAD and multiple cardiovascular risk factors.  By history he has diabetic peripheral neuropathy as well.  He complains of bilateral calf claudication.  He has had DVT in the past as well as what sounds like an embolic stroke stroke from potentially a PFO for which she has seen Dr. Ali Lowe.  He had lower extremity arterial Doppler studies performed 03/14/2022 revealing a right ABI of 0.62 and a left of 0.69.  He had a moderate lesion in the distal right SFA as well as tibial vessel disease with similar anatomy on the left.  I am going to begin him on Pletal 50 mg p.o. twice daily initially to see if he has a therapeutic effect.  If not, we will discuss a more invasive approach with peripheral angiography and potential endovascular therapy.

## 2022-04-07 ENCOUNTER — Telehealth (HOSPITAL_COMMUNITY): Payer: Self-pay

## 2022-04-07 NOTE — Telephone Encounter (Signed)
Called and was unable to confirm/remind patient of their appointment at the [facility] on 04/14/22.

## 2022-04-14 ENCOUNTER — Other Ambulatory Visit (HOSPITAL_COMMUNITY): Payer: Self-pay

## 2022-04-14 ENCOUNTER — Ambulatory Visit (HOSPITAL_COMMUNITY)
Admission: RE | Admit: 2022-04-14 | Discharge: 2022-04-14 | Disposition: A | Discharge status: Home / Self Care | Payer: 59 | Source: Ambulatory Visit | Attending: Physician Assistant | Admitting: Physician Assistant

## 2022-04-14 ENCOUNTER — Ambulatory Visit (HOSPITAL_BASED_OUTPATIENT_CLINIC_OR_DEPARTMENT_OTHER)
Admission: RE | Admit: 2022-04-14 | Discharge: 2022-04-14 | Disposition: A | Discharge status: Home / Self Care | Payer: 59 | Source: Ambulatory Visit | Attending: Internal Medicine | Admitting: Internal Medicine

## 2022-04-14 ENCOUNTER — Encounter (HOSPITAL_COMMUNITY): Payer: Self-pay | Admitting: Internal Medicine

## 2022-04-14 VITALS — BP 128/70 | HR 77 | Wt 140.2 lb

## 2022-04-14 DIAGNOSIS — Z7902 Long term (current) use of antithrombotics/antiplatelets: Secondary | ICD-10-CM | POA: Diagnosis not present

## 2022-04-14 DIAGNOSIS — Z87891 Personal history of nicotine dependence: Secondary | ICD-10-CM | POA: Insufficient documentation

## 2022-04-14 DIAGNOSIS — E1165 Type 2 diabetes mellitus with hyperglycemia: Secondary | ICD-10-CM | POA: Insufficient documentation

## 2022-04-14 DIAGNOSIS — Z8673 Personal history of transient ischemic attack (TIA), and cerebral infarction without residual deficits: Secondary | ICD-10-CM | POA: Diagnosis not present

## 2022-04-14 DIAGNOSIS — I252 Old myocardial infarction: Secondary | ICD-10-CM | POA: Insufficient documentation

## 2022-04-14 DIAGNOSIS — I2510 Atherosclerotic heart disease of native coronary artery without angina pectoris: Secondary | ICD-10-CM

## 2022-04-14 DIAGNOSIS — I255 Ischemic cardiomyopathy: Secondary | ICD-10-CM | POA: Diagnosis not present

## 2022-04-14 DIAGNOSIS — R0683 Snoring: Secondary | ICD-10-CM | POA: Diagnosis not present

## 2022-04-14 DIAGNOSIS — I5022 Chronic systolic (congestive) heart failure: Secondary | ICD-10-CM

## 2022-04-14 DIAGNOSIS — I739 Peripheral vascular disease, unspecified: Secondary | ICD-10-CM

## 2022-04-14 DIAGNOSIS — E1151 Type 2 diabetes mellitus with diabetic peripheral angiopathy without gangrene: Secondary | ICD-10-CM | POA: Diagnosis not present

## 2022-04-14 DIAGNOSIS — Z7901 Long term (current) use of anticoagulants: Secondary | ICD-10-CM | POA: Diagnosis not present

## 2022-04-14 DIAGNOSIS — Z8674 Personal history of sudden cardiac arrest: Secondary | ICD-10-CM | POA: Insufficient documentation

## 2022-04-14 DIAGNOSIS — I110 Hypertensive heart disease with heart failure: Secondary | ICD-10-CM | POA: Diagnosis not present

## 2022-04-14 DIAGNOSIS — E785 Hyperlipidemia, unspecified: Secondary | ICD-10-CM | POA: Insufficient documentation

## 2022-04-14 DIAGNOSIS — Z7984 Long term (current) use of oral hypoglycemic drugs: Secondary | ICD-10-CM | POA: Insufficient documentation

## 2022-04-14 DIAGNOSIS — Q2112 Patent foramen ovale: Secondary | ICD-10-CM | POA: Insufficient documentation

## 2022-04-14 DIAGNOSIS — Z79899 Other long term (current) drug therapy: Secondary | ICD-10-CM | POA: Insufficient documentation

## 2022-04-14 DIAGNOSIS — Z794 Long term (current) use of insulin: Secondary | ICD-10-CM | POA: Insufficient documentation

## 2022-04-14 LAB — COMPREHENSIVE METABOLIC PANEL
ALT: 10 U/L (ref 0–44)
AST: 15 U/L (ref 15–41)
Albumin: 3.9 g/dL (ref 3.5–5.0)
Alkaline Phosphatase: 73 U/L (ref 38–126)
Anion gap: 4 — ABNORMAL LOW (ref 5–15)
BUN: 7 mg/dL (ref 6–20)
CO2: 29 mmol/L (ref 22–32)
Calcium: 8.7 mg/dL — ABNORMAL LOW (ref 8.9–10.3)
Chloride: 107 mmol/L (ref 98–111)
Creatinine, Ser: 0.84 mg/dL (ref 0.61–1.24)
GFR, Estimated: >60 mL/min (ref >60)
Glucose, Bld: 192 mg/dL — ABNORMAL HIGH (ref 70–99)
Potassium: 3.7 mmol/L (ref 3.5–5.1)
Sodium: 140 mmol/L (ref 135–145)
Total Bilirubin: 0.1 mg/dL — ABNORMAL LOW (ref 0.3–1.2)
Total Protein: 6.7 g/dL (ref 6.5–8.1)

## 2022-04-14 LAB — LIPID PANEL
Cholesterol: 127 mg/dL (ref 0–200)
HDL: 57 mg/dL (ref >40)
LDL Cholesterol: 58 mg/dL (ref 0–99)
Total CHOL/HDL Ratio: 2.2 RATIO
Triglycerides: 60 mg/dL (ref <150)
VLDL: 12 mg/dL (ref 0–40)

## 2022-04-14 LAB — ECHOCARDIOGRAM COMPLETE
AR max vel: 2.78 cm2
AV Area VTI: 2.69 cm2
AV Area mean vel: 2.62 cm2
AV Mean grad: 3 mmHg
AV Peak grad: 4.7 mmHg
Ao pk vel: 1.08 m/s
Area-P 1/2: 3.45 cm2
Calc EF: 38.9 %
S' Lateral: 4.8 cm
Single Plane A2C EF: 42.2 %
Single Plane A4C EF: 35.2 %

## 2022-04-14 MED ORDER — SPIRONOLACTONE 25 MG PO TABS: 25.0000 mg | ORAL_TABLET | Freq: Every day | ORAL | 2 refills | Status: DC

## 2022-04-14 MED ORDER — ENTRESTO 49-51 MG PO TABS: 1.0000 | ORAL_TABLET | Freq: Two times a day (BID) | ORAL | 11 refills | Status: AC

## 2022-04-14 MED ORDER — FUROSEMIDE 20 MG PO TABS: 20.0000 mg | ORAL_TABLET | ORAL | 5 refills | Status: DC | PRN

## 2022-04-14 NOTE — Progress Notes (Signed)
ADVANCED HF CLINIC NOTE   Primary Care: [facility] HF Cardiologist: Dr. Haroldine Laws  HPI: Tommy Jimenez is a 56 y.o.male w/ history of ischemic heart disease, OOH cardiac arrest, IDDM, HTN, HLD, tobacco abuse, CVA and new diagnosis of systolic heart failure.  OOH cardiac arrest in 05/2017 in setting of acute anterior STEMI. Was treated at [facility], where LHC showed totally occluded ostial LAD, treated w/ PCI + DES. Per records there was no other significant CAD. Echocardiogram at the time showed reduced LVEF, 30-35%, and anteroapical AK. F/u echo 06/2017 showed slight improvement, EF up to 40%. It appears he has not had routien f/u w/ cardiology.    Admitted 3/23 w/ rt sided numbness and weakness. CT head with no acute intracranial process, multiple old infarcts in the cerebellum.  MR brain with small focus of acute ischemia in the right frontal cortex, no hemorrhage/mass effect, multiple old cerebellar infarcts and findings of small vessel ischemia that is chronic. MRA head/neck with no emergent large vessel occlusion or high-grade stenosis, moderate stenosis of the mid V2 segment of the left vertebral artery. Echo showed reduced LVEF, 20-25%, RV normal. Cardiology consulted and performed TEE, showing severely reduced LVEF 20-25%, no LA thrombus. + Bubble study suggestive of PFO. Neurology recommended a/c w/ Eliquis and 30 day monitor to assess for possible Afib (arranged by cardiology). Referred to [facility] clinic.    Seen in [facility] 4/23. Stable NYHA II symptoms, ReDs 37%. [name] and Jardiance started, weight 144 lbs. Referred to Structural heart for PFO. Evaluated by Dr. Ali Lowe, no need for closure. Also referred to the [facility] for further management of CHF and titration of GDMT. Also referred for LE arterial dopplers in the setting of b/l leg pain c/w claudication. He had Doppler studies performed 03/14/2022 revealing a right ABI of 0.62 and left of 0.69.  He had moderate distal SFA disease  bilaterally along with tibial vessel disease typical of a diabetic. He was referred to Dr. Gwenlyn Found, who started him on cilostazol.   He presents back to [facility] today for f/u and repeat echo. Echo shows EF 30-35%. RV normal.   Endorses NYHA Class II symptoms, though also limited still significantly by claudication. Denies CP. Reports full med compliance but out of Eliquis. Requesting samples. Quit smoking ~2 wks ago. 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   BP well controlled today, 128/70. Denies orthostatic symptoms. Volume status good. Wt down 4 lb from previous OV. ReDS 28%.     Cardiac Testing  - TEE (3/23): EF 20-25%, LV severely reduced, RV ok, no LAA thrombus, Agitated saline contrast bubble study was positive with shunting observed within 3-6 cardiac cycles suggestive of interatrial shunt.  - Echo (3/23): EF 20-25%, severely reduced LV with global HK, moderate LVH, RV ok.   - LHC 05/30/2017 ([place]):  Angiographic Findings Cardiac Arteries and Lesion Findings LMCA: 0%. LAD: Lesion on Prox LAD: Ostial.100% stenosis 8 mm length reduced to 0%. Pre procedure TIMI 0 flow was noted. Post Procedure TIMI III flow was present. Poor run off was present. The lesion was diagnosed as High Risk (C). Devices used - Abbott 0.014" x 190cm BMW J-Tip PTCI Guidewire. Number of passes: 1. - 2.5 mm X 12 mm Trek RX. 1 inflation(s) to a max pressure of: 8 atm. - 3.5 mm x 12 mm Xience Sierra DES Stent. 1 inflation(s) to a max pressure of: 12 atm. - [place] Trek 3.5 mm  X 12 mm. 1 inflation(s) to a max pressure of: 12 atm. LCx: 0%. RCA: 0%.  - Echo 7/23 (today) EF 30-35%, RV ok   Past Medical History:  Diagnosis Date   Coronary artery disease    stent placed   Dilated cardiomyopathy ([place])    DM (diabetes mellitus) ([place]) 1996   DVT (deep venous thrombosis) (HCC)    right leg    Erectile dysfunction    H/O acute myocardial infarction    H/O heart artery stent     History of DVT (deep vein thrombosis)    HTN (hypertension)    Ischemic cardiomyopathy    Myocardial infarction (HCC)    Obesity    Peripheral vascular disease (HCC)    Pure hypercholesterolemia    Current Outpatient Medications  Medication Sig Dispense Refill   apixaban (ELIQUIS) 5 MG TABS tablet Take 1 tablet (5 mg total) by mouth 2 (two) times daily. 60 tablet 2   cilostazol (PLETAL) 50 MG tablet Take 1 tablet (50 mg total) by mouth 2 (two) times daily. 180 tablet 1   empagliflozin (JARDIANCE) 10 MG TABS tablet Take 1 tablet (10 mg total) by mouth daily before breakfast. 30 tablet 11   furosemide (LASIX) 20 MG tablet Take 1 tablet (20 mg total) by mouth daily. 30 tablet 5   insulin isophane & regular human KwikPen (NOVOLIN 70/30 KWIKPEN) (70-30) 100 UNIT/ML KwikPen Inject 22 Units into the skin 2 (two) times daily before a meal. 15 mL 2   Insulin Pen Needle (PEN NEEDLES 3/16") 31G X 5 MM MISC Use as directed with insulin pen 100 each 2   metoprolol succinate (TOPROL XL) 25 MG 24 hr tablet Take 1 tablet (25 mg total) by mouth at bedtime. 90 tablet 1   rosuvastatin (CRESTOR) 40 MG tablet Take 1 tablet (40 mg total) by mouth daily. 30 tablet 2   sacubitril-valsartan (ENTRESTO) 24-26 MG Take 1 tablet by mouth 2 (two) times daily. 60 tablet 11   spironolactone (ALDACTONE) 25 MG tablet Take 0.5 tablets (12.5 mg total) by mouth daily. 45 tablet 3   No current facility-administered medications for this encounter.   No Known Allergies  Social History   Socioeconomic History   Marital status: Legally Separated    Spouse name: Not on file   Number of children: Not on file   Years of education: Not on file   Highest education level: Not on file  Occupational History   Not on file  Tobacco Use   Smoking status: Some Days    Years: 20.00    Types: Cigarettes   Smokeless tobacco: Never   Tobacco comments:    Former smoker x 20 + years but quit after his recent hospitalization 12/03/21   Vaping Use   Vaping Use: Never used  Substance and Sexual Activity   Alcohol use: Yes    Comment: socially - beer   Drug use: No   Sexual activity: Not on file  Other Topics Concern   Not on file  Social History Narrative   Former smoker x 20 + years but quit after his recent hospitalization   Social Determinants of Health   Financial Resource Strain: High Risk (03/21/2022)   Overall Financial Resource Strain (CARDIA)    Difficulty of Paying Living Expenses: Hard  Food Insecurity: No Food Insecurity (12/22/2021)   Hunger Vital Sign    Worried About Running Out of Food in the Last Year: Never true    Ran Out of Food  in the Last Year: Never true  Transportation Needs: Unmet Transportation Needs (02/08/2022)   PRAPARE - Transportation    Lack of Transportation (Medical): Yes    Lack of Transportation (Non-Medical): Yes  Physical Activity: Not on file  Stress: No Stress Concern Present (12/22/2021)   [place]    Feeling of Stress : Only a little  Social Connections: Moderately Integrated (12/22/2021)   Social Connection and Isolation Panel [NHANES]    Frequency of Communication with Friends and Family: Twice a week    Frequency of Social Gatherings with Friends and Family: Twice a week    Attends Religious Services: 1 to 4 times per year    Active Member of [employer] or Organizations: Yes    Attends Archivist Meetings: 1 to 4 times per year    Marital Status: Separated  Intimate Partner Violence: Not At Risk (12/22/2021)   Humiliation, Afraid, Rape, and Kick questionnaire    Fear of Current or Ex-Partner: No    Emotionally Abused: No    Physically Abused: No    Sexually Abused: No   Family history: father CAD died from [place] at age 82. Mother and brother w/ CKD, brother s/p recent renal transplant. Sister w/ PVD     Family History  Problem Relation Age of Onset   Diabetes Mother        complications of  diabetes   Heart disease Father        died of MI, died age 14yo   Hypertension Father    Multiple sclerosis Sister    Cancer Brother        brain tumor   Diabetes Brother    Lupus Sister    Lupus Sister    Stroke Neg Hx    BP 128/70   Pulse 77   Wt 63.6 kg (140 lb 3.2 oz)   SpO2 100%   BMI 21.32 kg/m   Wt Readings from Last 3 Encounters:  04/14/22 63.6 kg (140 lb 3.2 oz)  03/25/22 65.3 kg (144 lb)  02/23/22 66.3 kg (146 lb 3.2 oz)   PHYSICAL EXAM: ReDS 28%  General:  Well appearing, thin. No respiratory difficulty HEENT: normal Neck: supple. no JVD. Carotids 2+ bilat; no bruits. No lymphadenopathy or thyromegaly appreciated. Cor: PMI nondisplaced. Regular rate & rhythm. No rubs, gallops or murmurs. Lungs: clear Abdomen: soft, nontender, nondistended. No hepatosplenomegaly. No bruits or masses. Good bowel sounds. Extremities: no cyanosis, clubbing, rash, edema Neuro: alert & oriented x 3, cranial nerves grossly intact. moves all 4 extremities w/o difficulty. Affect pleasant.   ASSESSMENT & PLAN: 1. Chronic Systolic Heart Failure - Primarily Ischemic CM, possibly mixed w/ nonischemic component.   - S/p cardiac arrest due to pLAD occlusion in 2018, treated w/ PCI. Peri MI EF ~30-35%. Improved on f/u echo to 40%  - Echo 3/23 EF down to 20-25%, RV normal w/ global HK. Drop in EF in setting of CVA. No recent ischemic like CP nor RWMA or EKG abnormalties to suggest underlying coronary ischemia - NYHA Class II, mainly limited by b/l leg claudication. Euvolemic on exam. ReDS 28%  - Echo today. EF Remains low, 30-35%, RV normal. Denies ischemic CP  - Continue Jardiance 10 mg daily  - Increase Entresto to 49-51 mg bid  - Increase spiro to 25 mg daily. - Continue Jardiance 10 mg daily. - Continue Toprol XL 25 mg daily. - Switch Lasix to PRN  - Check BMP today  -  Discussed ICD indication. He is undecided and will call if he wants to proceed w/ EP referral. QRS < 150 ms. Not  candidate for CRT-D   2. Recent CVA - Neurology felt cardio embolic, though TEE showed no thrombus. - Continue Eliquis 5 mg bid. - Continue statin.  - Wore 30-day monitor, per patient report did not show any arrhythmias.  - He needs Neuro follow up.   3. PFO  - Noted on TEE. - Seen by Dr. Ali Lowe, no need for closure. - Continue medical management.   4. CAD  - History of OOH cardiac arrest in 05/2017 in setting of acute anterior STEMI w/ ostial LAD occlusion treated w/ PCI. Per cath report there was no other significant disease. - Given slight progression in LV dysfunction, consider repeat LHC once he is further out from recent CVA. - No CP.  - Continue clopidogrel, ? blocker, no ASA with Eliquis. - Continue rosuvastatin 40 mg daily.   5. LE PAD w/ Claudication  -  Doppler studies performed 03/14/2022 revealing a right ABI of 0.62 and left of 0.69.  He had moderate distal SFA disease bilaterally along with tibial vessel disease typical of a diabetic.  - He was referred to Dr. Gwenlyn Found, who started him on cilostazol. - F/u w/ Dr. Gwenlyn Found to discuss possible percutaneous intervention  - encouraged to continue to refrain to tobacco use    6. Hypertension  - Controlled on current regimen  - GDMT titration per above    7. Type 2 DM  - Poorly controlled, Hgb A1c 8.3. - On insulin. - Continue jardiance   8. HLD, LDL Goal < 70 - LDL elevated at 148 mg/dL. - on Crestor 40 mg daily. - Check Lipids/LFTs - if LDL not at goal will need addition of Zetia +/- [id number] (lipid clinic referral)    9. Tobacco abuse  - quit ~2 wks ago - congratulated on efforts   10. Snoring - Arrange sleep study, suspect contributes to fatigue.   F/u w/ APP in 2-3 months   Lyda Jester, PA-C  04/14/22  Patient seen and examined with the above-signed Advanced Practice Provider and/or Housestaff. I personally reviewed laboratory data, imaging studies and relevant notes. I independently examined the  patient and formulated the important aspects of the plan. I have edited the note to reflect any of my changes or salient points. I have personally discussed the plan with the patient and/or family.  Doing well. NYHA II. Limited mostly by LE claudication. Volume status ok on exam and ReDS. EF stable 30-35% on echo today (personally reviewed)  General:  Well appearing. No resp difficulty HEENT: normal Neck: supple. no JVD. Carotids 2+ bilat; no bruits. No lymphadenopathy or thryomegaly appreciated. Cor: PMI nondisplaced. Regular rate & rhythm. No rubs, gallops or murmurs. Lungs: clear Abdomen: soft, nontender, nondistended. No hepatosplenomegaly. No bruits or masses. Good bowel sounds. Extremities: no cyanosis, clubbing, rash, edema Neuro: alert & orientedx3, cranial nerves grossly intact. moves all 4 extremities w/o difficulty. Affect pleasant  EF stable. Agree with increasing spiro and Entresto. Change lasix to prn. We discussed ICD but he is not interested at this time.   Continue to follow with Dr. Gwenlyn Found. Ideally would stop Pletal with his HF diagnosis. Likely needs LE arteriography.   Glori Bickers, MD  3:17 PM

## 2022-04-14 NOTE — Progress Notes (Signed)
  Echocardiogram 2D Echocardiogram has been performed.  Tommy Jimenez 04/14/2022, 1:26 PM

## 2022-04-14 NOTE — Patient Instructions (Addendum)
Increase Spironolactone to 25 mg daily  Increase Entresto to 49/51 mg Twice daily   Take Lasix as need  You have been referred for sleep study they will call to schedule the appointment  Your physician recommends that you schedule a follow-up appointment in: 2-3 months October. Call office in August to schedule an appointment.  If you have any questions or concerns before your next appointment please send [place] a message through [place] or call our office at [phone].    TO LEAVE A MESSAGE FOR THE NURSE SELECT OPTION 2, PLEASE LEAVE A MESSAGE INCLUDING: YOUR NAME DATE OF BIRTH CALL BACK NUMBER REASON FOR CALL**this is important as we prioritize the call backs  YOU WILL RECEIVE A CALL BACK THE SAME DAY AS LONG AS YOU CALL BEFORE 4:00 PM  At the [facility], you and your health needs are our priority. As part of our continuing mission to provide you with exceptional heart care, we have created designated Provider Care Teams. These Care Teams include your primary Cardiologist (physician) and Advanced Practice Providers (APPs- Physician Assistants and Nurse Practitioners) who all work together to provide you with the care you need, when you need it.   You may see any of the following providers on your designated Care Team at your next follow up: Dr Glori Bickers Dr Haynes Kerns, NP Lyda Jester, [place] [facility], [place] Audry Riles, PharmD   Please be sure to bring in all your medications bottles to every appointment.

## 2022-04-14 NOTE — Progress Notes (Signed)
Medication Samples have been provided to the patient.  Drug name: Eliquis       Strength: 5 mg         Qty: 4  LOT: [id number]  Exp.Date: 12/2022  Dosing instructions: take 1 tablet Twice daily   The patient has been instructed regarding the correct time, dose, and frequency of taking this medication, including desired effects and most common side effects.   Juanita Laster Else Habermann 3:39 PM 04/14/2022

## 2022-04-14 NOTE — Progress Notes (Signed)
ReDS Vest / Clip - 04/14/22 1400       ReDS Vest / Clip   Station Marker C    Ruler Value 27    ReDS Value Range Low volume    ReDS Actual Value 28

## 2022-04-14 NOTE — Progress Notes (Signed)
Medication Samples have been provided to the patient.  Drug name: Delene Loll       Strength: 49/51        Qty: 2  LOT: [id number]  Exp.Date: 07/2023   Dosing instructions: Take 1 tablet Twice daily   The patient has been instructed regarding the correct time, dose, and frequency of taking this medication, including desired effects and most common side effects.   Tommy Jimenez 3:37 PM 04/14/2022

## 2022-04-14 NOTE — Addendum Note (Signed)
Encounter addended by: Stanford Scotland, RN on: 04/14/2022 3:41 PM  Actions taken: Clinical Note Signed

## 2022-04-20 ENCOUNTER — Ambulatory Visit: Payer: 59 | Admitting: Cardiovascular Disease

## 2022-04-21 ENCOUNTER — Telehealth (HOSPITAL_COMMUNITY): Payer: Self-pay | Admitting: Surgery

## 2022-04-21 NOTE — Telephone Encounter (Signed)
I attempted to reach patient regarding pending home sleep study.  I was unable to reach him and unable to leave a message.

## 2022-04-24 ENCOUNTER — Emergency Department (HOSPITAL_COMMUNITY): Payer: 59

## 2022-04-24 ENCOUNTER — Encounter (HOSPITAL_COMMUNITY): Payer: Self-pay

## 2022-04-24 ENCOUNTER — Emergency Department (HOSPITAL_COMMUNITY)
Admission: EM | Admit: 2022-04-24 | Discharge: 2022-04-24 | Disposition: A | Discharge status: Home / Self Care | Payer: 59 | Attending: Emergency Medicine | Admitting: Emergency Medicine

## 2022-04-24 ENCOUNTER — Other Ambulatory Visit: Payer: Self-pay

## 2022-04-24 DIAGNOSIS — R519 Headache, unspecified: Secondary | ICD-10-CM | POA: Insufficient documentation

## 2022-04-24 DIAGNOSIS — R220 Localized swelling, mass and lump, head: Secondary | ICD-10-CM | POA: Insufficient documentation

## 2022-04-24 DIAGNOSIS — Z86718 Personal history of other venous thrombosis and embolism: Secondary | ICD-10-CM | POA: Diagnosis not present

## 2022-04-24 DIAGNOSIS — Z20822 Contact with and (suspected) exposure to covid-19: Secondary | ICD-10-CM | POA: Diagnosis not present

## 2022-04-24 DIAGNOSIS — D72829 Elevated white blood cell count, unspecified: Secondary | ICD-10-CM | POA: Diagnosis not present

## 2022-04-24 DIAGNOSIS — I2510 Atherosclerotic heart disease of native coronary artery without angina pectoris: Secondary | ICD-10-CM | POA: Insufficient documentation

## 2022-04-24 DIAGNOSIS — Z8673 Personal history of transient ischemic attack (TIA), and cerebral infarction without residual deficits: Secondary | ICD-10-CM | POA: Diagnosis not present

## 2022-04-24 DIAGNOSIS — E119 Type 2 diabetes mellitus without complications: Secondary | ICD-10-CM | POA: Diagnosis not present

## 2022-04-24 DIAGNOSIS — K047 Periapical abscess without sinus: Secondary | ICD-10-CM

## 2022-04-24 DIAGNOSIS — R000 Tachycardia, unspecified: Secondary | ICD-10-CM | POA: Insufficient documentation

## 2022-04-24 DIAGNOSIS — I10 Essential (primary) hypertension: Secondary | ICD-10-CM | POA: Diagnosis not present

## 2022-04-24 DIAGNOSIS — R110 Nausea: Secondary | ICD-10-CM | POA: Diagnosis not present

## 2022-04-24 LAB — COMPREHENSIVE METABOLIC PANEL
ALT: 9 U/L (ref 0–44)
AST: 16 U/L (ref 15–41)
Albumin: 4.6 g/dL (ref 3.5–5.0)
Alkaline Phosphatase: 100 U/L (ref 38–126)
Anion gap: 11 (ref 5–15)
BUN: 8 mg/dL (ref 6–20)
CO2: 27 mmol/L (ref 22–32)
Calcium: 9.5 mg/dL (ref 8.9–10.3)
Chloride: 101 mmol/L (ref 98–111)
Creatinine, Ser: 1.01 mg/dL (ref 0.61–1.24)
GFR, Estimated: >60 mL/min (ref >60)
Glucose, Bld: 173 mg/dL — ABNORMAL HIGH (ref 70–99)
Potassium: 4.4 mmol/L (ref 3.5–5.1)
Sodium: 139 mmol/L (ref 135–145)
Total Bilirubin: 0.7 mg/dL (ref 0.3–1.2)
Total Protein: 9.1 g/dL — ABNORMAL HIGH (ref 6.5–8.1)

## 2022-04-24 LAB — CBC WITH DIFFERENTIAL/PLATELET
Abs Immature Granulocytes: 0.08 10*3/uL — ABNORMAL HIGH (ref 0.00–0.07)
Basophils Absolute: 0.1 10*3/uL (ref 0.0–0.1)
Basophils Relative: 1 %
Eosinophils Absolute: 0.1 10*3/uL (ref 0.0–0.5)
Eosinophils Relative: 0 %
HCT: 44.3 % (ref 39.0–52.0)
Hemoglobin: 14.2 g/dL (ref 13.0–17.0)
Immature Granulocytes: 1 %
Lymphocytes Relative: 21 %
Lymphs Abs: 3.3 10*3/uL (ref 0.7–4.0)
MCH: 27.8 pg (ref 26.0–34.0)
MCHC: 32.1 g/dL (ref 30.0–36.0)
MCV: 86.7 fL (ref 80.0–100.0)
Monocytes Absolute: 1.5 10*3/uL — ABNORMAL HIGH (ref 0.1–1.0)
Monocytes Relative: 9 %
Neutro Abs: 11 10*3/uL — ABNORMAL HIGH (ref 1.7–7.7)
Neutrophils Relative %: 68 %
Platelets: 394 10*3/uL (ref 150–400)
RBC: 5.11 MIL/uL (ref 4.22–5.81)
RDW: 13.9 % (ref 11.5–15.5)
WBC: 16.1 10*3/uL — ABNORMAL HIGH (ref 4.0–10.5)
nRBC: 0 % (ref 0.0–0.2)

## 2022-04-24 LAB — URINALYSIS, ROUTINE W REFLEX MICROSCOPIC
Bacteria, UA: NONE SEEN
Bilirubin Urine: NEGATIVE
Glucose, UA: NEGATIVE mg/dL
Ketones, ur: 5 mg/dL — AB
Leukocytes,Ua: NEGATIVE
Nitrite: NEGATIVE
Protein, ur: 30 mg/dL — AB
Specific Gravity, Urine: 1.012 (ref 1.005–1.030)
pH: 6 (ref 5.0–8.0)

## 2022-04-24 LAB — SARS CORONAVIRUS 2 BY RT PCR: SARS Coronavirus 2 by RT PCR: NEGATIVE

## 2022-04-24 MED ORDER — BUPIVACAINE-EPINEPHRINE (PF) 0.5% -1:200000 IJ SOLN
1.8000 mL | Freq: Once | INTRAMUSCULAR | Status: AC
  Administered 2022-04-24: 1.8 mL
  Filled 2022-04-24: qty 1.8

## 2022-04-24 MED ORDER — SODIUM CHLORIDE 0.9 % IV BOLUS
500.0000 mL | Freq: Once | INTRAVENOUS | Status: AC
Start: 2022-04-24 — End: 2022-04-24
  Administered 2022-04-24: 500 mL via INTRAVENOUS

## 2022-04-24 MED ORDER — METOCLOPRAMIDE HCL 5 MG/ML IJ SOLN
10.0000 mg | Freq: Once | INTRAMUSCULAR | Status: AC
  Administered 2022-04-24: 10 mg via INTRAVENOUS
  Filled 2022-04-24: qty 2

## 2022-04-24 MED ORDER — PENICILLIN V POTASSIUM 500 MG PO TABS
500.0000 mg | ORAL_TABLET | Freq: Four times a day (QID) | ORAL | 0 refills | Status: DC
  Filled 2022-04-24: qty 28, 7d supply, fill #0

## 2022-04-24 MED ORDER — ACETAMINOPHEN 325 MG PO TABS
650.0000 mg | ORAL_TABLET | Freq: Once | ORAL | Status: AC
  Administered 2022-04-24: 650 mg via ORAL
  Filled 2022-04-24: qty 2

## 2022-04-24 NOTE — ED Provider Notes (Signed)
[facility] DEPT Provider Note   CSN: [id number] Arrival date & time: 04/24/22  1759     History  Chief Complaint  Patient presents with   Headache   Nausea    Tommy Jimenez is a 56 y.o. male.  Patient is a 57 year old male with a past medical history of hypertension, DVT on Eliquis, CAD, CVA without residual deficits presenting to the emergency department with headache as well as swelling in his mouth.  The patient states that his headache started last night and he took some Tylenol but it is continued since this morning.  He states that when he had a stroke in the past that presented with a headache.  He denies any associated vision changes, numbness or weakness.  He states that he has felt nauseous but denies any vomiting.  Denies any trauma or falls.  He denies any recent fevers or chills, cough or congestion, chest pain or shortness of breath, dysuria or hematuria.  He states that he has noticed increased swelling on the roof of his mouth since last night.  He states he does have a history of poor dentition and prior dental infections in the past.  The history is provided by the patient.  Headache      Home Medications Prior to Admission medications   Medication Sig Start Date End Date Taking? Authorizing Provider  apixaban (ELIQUIS) 5 MG TABS tablet Take 1 tablet (5 mg total) by mouth 2 (two) times daily. 12/06/21 04/15/23  [place] Donnamarie Poag, DO  cilostazol (PLETAL) 50 MG tablet Take 1 tablet (50 mg total) by mouth 2 (two) times daily. 03/25/22   Lorretta Harp, MD  empagliflozin (JARDIANCE) 10 MG TABS tablet Take 1 tablet (10 mg total) by mouth daily before breakfast. 12/22/21   Lyda Jester M, PA-C  furosemide (LASIX) 20 MG tablet Take 1 tablet (20 mg total) by mouth as needed. 04/14/22   Bensimhon, Shaune Pascal, MD  insulin isophane & regular human KwikPen (NOVOLIN 70/30 KWIKPEN) (70-30) 100 UNIT/ML KwikPen Inject 22 Units into the skin 2 (two)  times daily before a meal. 12/06/21 04/15/23  [place] Donnamarie Poag, DO  Insulin Pen Needle (PEN NEEDLES 3/16") 31G X 5 MM MISC Use as directed with insulin pen 12/06/21   [place] Eric J, DO  metoprolol succinate (TOPROL XL) 25 MG 24 hr tablet Take 1 tablet (25 mg total) by mouth at bedtime. 12/30/21   Early Osmond, MD  rosuvastatin (CRESTOR) 40 MG tablet Take 1 tablet (40 mg total) by mouth daily. 12/07/21 04/15/23  [place] Eric J, DO  sacubitril-valsartan (ENTRESTO) 49-51 MG Take 1 tablet by mouth 2 (two) times daily. 04/14/22   Bensimhon, Shaune Pascal, MD  spironolactone (ALDACTONE) 25 MG tablet Take 1 tablet (25 mg total) by mouth daily. 04/14/22 07/13/22  Bensimhon, Shaune Pascal, MD      Allergies    Patient has no known allergies.    Review of Systems   Review of Systems  Neurological:  Positive for headaches.    Physical Exam Updated Vital Signs BP (!) 163/82 (BP Location: Left Arm)   Pulse 88   Temp 98.6 F (37 C) (Oral)   Resp 13   Ht '5\' 8"'$  (1.727 m)   SpO2 96%   BMI 21.32 kg/m  Physical Exam Vitals and nursing note reviewed.  Constitutional:      General: He is not in acute distress.    Appearance: He is well-developed.  HENT:  Head: Normocephalic and atraumatic.     Comments: Poor dentition throughout, approximately 2 cm area of fluctuance on the hard palate near the right posterior teeth with tooth tenderness, no active drainage    Mouth/Throat:     Mouth: Mucous membranes are moist.  Eyes:     General: No visual field deficit.    Extraocular Movements: Extraocular movements intact.     Pupils: Pupils are equal, round, and reactive to light.  Cardiovascular:     Rate and Rhythm: Normal rate and regular rhythm.  Pulmonary:     Effort: Pulmonary effort is normal.     Breath sounds: Normal breath sounds.  Abdominal:     General: Bowel sounds are normal.     Palpations: Abdomen is soft.  Musculoskeletal:        General: Normal range of motion.     Cervical back: Normal  range of motion and neck supple.  Skin:    General: Skin is warm.  Neurological:     Mental Status: He is alert and oriented to person, place, and time.     Cranial Nerves: No cranial nerve deficit, dysarthria or facial asymmetry.     Sensory: No sensory deficit.     Motor: No weakness.     Coordination: Coordination normal.  Psychiatric:        Behavior: Behavior normal.     ED Results / Procedures / Treatments   Labs (all labs ordered are listed, but only abnormal results are displayed) Labs Reviewed  COMPREHENSIVE METABOLIC PANEL - Abnormal; Notable for the following components:      Result Value   Glucose, Bld 173 (*)    Total Protein 9.1 (*)    All other components within normal limits  CBC WITH DIFFERENTIAL/PLATELET - Abnormal; Notable for the following components:   WBC 16.1 (*)    Neutro Abs 11.0 (*)    Monocytes Absolute 1.5 (*)    Abs Immature Granulocytes 0.08 (*)    All other components within normal limits  URINALYSIS, ROUTINE W REFLEX MICROSCOPIC - Abnormal; Notable for the following components:   Hgb urine dipstick SMALL (*)    Ketones, ur 5 (*)    Protein, ur 30 (*)    All other components within normal limits  SARS CORONAVIRUS 2 BY RT PCR    EKG EKG Interpretation  Date/Time:  Sunday April 24 2022 19:36:23 EDT Ventricular Rate:  100 PR Interval:  143 QRS Duration: 81 QT Interval:  369 QTC Calculation: 476 R Axis:   65 Text Interpretation: Sinus tachycardia Consider left ventricular hypertrophy Anterior Q waves, possibly due to LVH No significant change since last tracing Confirmed by Oneal Deputy [phone]) on 04/24/2022 7:56:25 PM  Radiology DG Chest 2 View  Result Date: 04/24/2022 CLINICAL DATA:  Cough EXAM: CHEST - 2 VIEW COMPARISON:  05/08/2019 FINDINGS: The heart size and mediastinal contours are within normal limits. Both lungs are clear. The visualized skeletal structures are unremarkable. IMPRESSION: Normal study. Electronically Signed    By: Rolm Baptise M.D.   On: 04/24/2022 20:23   CT HEAD WO CONTRAST (5MM)  Result Date: 04/24/2022 CLINICAL DATA:  Headache. EXAM: CT HEAD WITHOUT CONTRAST TECHNIQUE: Contiguous axial images were obtained from the base of the skull through the vertex without intravenous contrast. RADIATION DOSE REDUCTION: This exam was performed according to the departmental dose-optimization program which includes automated exposure control, adjustment of the mA and/or kV according to patient size and/or use of iterative reconstruction technique. COMPARISON:  December 04, 2021 FINDINGS: Brain: No evidence of acute infarction, hemorrhage, hydrocephalus, extra-axial collection or mass lesion/mass effect.Stable areas of encephalomalacia in bilateral cerebellar cortices reportedly represent prior ischemic infarctions. Vascular: Calcific atherosclerotic disease of the left vertebral artery and intra cavernous carotid arteries. Skull: Normal. Negative for fracture or focal lesion. Sinuses/Orbits: No acute finding. Other: None. IMPRESSION: 1. No acute intracranial abnormality. 2. Stable areas of encephalomalacia in bilateral cerebellar cortices reportedly represent prior ischemic infarctions. Electronically Signed   By: Fidela Salisbury M.D.   On: 04/24/2022 19:35    Procedures .Marland KitchenIncision and Drainage  Date/Time: 04/24/2022 10:39 PM  Performed by: Ottie Glazier, DO Authorized by: Ottie Glazier, DO   Consent:    Consent obtained:  Verbal   Consent given by:  Patient   Risks, benefits, and alternatives were discussed: yes     Risks discussed:  Bleeding and incomplete drainage   Alternatives discussed:  No treatment and delayed treatment Universal protocol:    Procedure explained and questions answered to patient or proxy's satisfaction: yes     Test results available : yes     Patient identity confirmed:  Verbally with patient Location:    Type:  Abscess   Size:  2cm   Location:  Mouth   Mouth location:   Palate Sedation:    Sedation type:  None Anesthesia:    Anesthesia method:  Local infiltration   Local anesthetic:  Bupivacaine 0.25% WITH epi Procedure type:    Complexity:  Simple Procedure details:    Ultrasound guidance: no     Needle aspiration: yes     Needle size:  18 G   Incision types:  Stab incision   Incision depth:  Dermal   Drainage:  Purulent   Drainage amount:  Moderate   Wound treatment:  Wound left open   Packing materials:  None Post-procedure details:    Procedure completion:  Tolerated well, no immediate complications     Medications Ordered in ED Medications  acetaminophen (TYLENOL) tablet 650 mg (650 mg Oral Given 04/24/22 1937)  metoCLOPramide (REGLAN) injection 10 mg (10 mg Intravenous Given 04/24/22 1937)  sodium chloride 0.9 % bolus 500 mL (0 mLs Intravenous Stopped 04/24/22 2208)  bupivacaine-epinephrine (PF) (MARCAINE W/ EPI) 0.5% -1:200000 injection 1.8 mL (1.8 mLs Infiltration Given 04/24/22 1937)  bupivacaine-epinephrine (PF) (MARCAINE W/ EPI) 0.5% -1:200000 injection 1.8 mL (1.8 mLs Infiltration Given by Other 04/24/22 2207)    ED Course/ Medical Decision Making/ A&P                           Medical Decision Making Patient is a 56 year old male with a past medical history of CVA without residual deficits, DVT on Eliquis, CAD, hypertension, diabetes presenting to the emergency department with headache as well as swelling in his mouth.  Due to patient's headache and history of stroke on blood thinner, he will have a CT head performed to evaluate for ICH or mass effect.  The patient will additionally have labs to evaluate for dehydration or infection as causes of his headaches.  He also appears to have a dental abscess that requires I&D as well as antibiotic treatment.  Amount and/or Complexity of Data Reviewed Labs: ordered. Radiology: ordered.  Risk OTC drugs. Prescription drug management.   Upon reassessment, the patient CT head is negative.  His  labs are reviewed and interpreted by myself and have elevated white count of 16 and otherwise are within normal  range.  He has no other signs of infection in his urine or in chest and COVID test is negative.  Elevated white count is secondary to likely to his dental abscess.  The abscess was drained per procedure note above.  Patient tolerated this well.  He reports resolution of his headache and is stable for discharge home with outpatient primary care and dental follow-up.  He was given strict return precautions.        Final Clinical Impression(s) / ED Diagnoses Final diagnoses:  None    Rx / DC Orders ED Discharge Orders     None         Hezikiah, Retzloff, DO 04/24/22 2348

## 2022-04-24 NOTE — ED Notes (Signed)
Patient transported to CT 

## 2022-04-24 NOTE — ED Triage Notes (Signed)
Pt c/o severe headache and some nausea since last night. Pt is on eliquis for a previous stroke. No focal neuro deficits noted. Does report some tingling to bilateral feet x 2 weeks.

## 2022-04-24 NOTE — ED Notes (Signed)
Pt returned from CT °

## 2022-04-24 NOTE — ED Provider Triage Note (Signed)
Emergency Medicine Provider Triage Evaluation Note  Tommy Jimenez , a 56 y.o. male  was evaluated in triage.  Pt complains of headache.  His symptoms started yesterday.  His headache is diffuse all over his head.  He reports compliance with his medications however notes he has not been taking his Jardiance as he ran out of it and has been missing his metoprolol for 2 days. He denies striking his head or passing out, is compliant with his anticoagulation.  He states that it started yesterday slightly, he then went and took a nap and after it was much worse.  Physical Exam  BP (!) 180/93   Pulse (!) 114   Temp 99.7 F (37.6 C)   Resp 16   Ht '5\' 8"'$  (1.727 m)   SpO2 99%   BMI 21.32 kg/m  Gen:   Awake, no distress   Resp:  Normal effort  MSK:   Moves extremities without difficulty  Other:  Normal speech.  Facial movements are symmetric.  Moves all 4 extremities spontaneously without difficulty.  Ambulates without difficulty.  Medical Decision Making  Medically screening exam initiated at 6:48 PM.  Appropriate orders placed.  Ashwin Tibbs was informed that the remainder of the evaluation will be completed by another provider, this initial triage assessment does not replace that evaluation, and the importance of remaining in the ED until their evaluation is complete.     Lorin Glass, [place] 04/24/22 1849

## 2022-04-24 NOTE — Discharge Instructions (Addendum)
You were seen in the emergency department for your headache and your mouth swelling.  Your head scan showed no signs of new strokes or bleeding in your brain as a cause of your headache.  Your work-up showed no signs of dehydration.  You do have a abscess in your mouth that we drained for you in the emergency department.  I have given you a prescription of antibiotics and you should complete this as prescribed.  Your headache could be due to your infection, however you should follow-up with your primary doctor in the next few days to have your symptoms rechecked.  You should also follow-up in the dental clinic to have your abscess rechecked in 4 definitive treatment of your infected tooth.  You should return to the emergency department if your pain gets significantly worse, you have repetitive vomiting, you have numbness or weakness in 1 side of the body compared to the other, you pass out, or if you have any other new or concerning symptoms.

## 2022-04-25 ENCOUNTER — Other Ambulatory Visit (HOSPITAL_COMMUNITY): Payer: Self-pay

## 2022-04-27 ENCOUNTER — Other Ambulatory Visit: Payer: Self-pay | Admitting: *Deleted

## 2022-04-27 NOTE — Patient Outreach (Signed)
  Care Coordination   04/27/2022 Name: Tommy Jimenez MRN: [id number] DOB: 1965/11/02    Mr Stocking left a voice message on 04/21/22 1342 reporting he was calling to schedule an appointment and offering a new outreach number of [phone] This RN CM closed services with patient on 12/29/21 after [facility] services completed   Care Coordination Outreach Attempts:  An unsuccessful telephone outreach was attempted today to offer the patient information about available care coordination services as a benefit of their health plan.    Follow Up Plan:  No further outreach attempts will be made at this time. We have been unable to contact the patient to offer or enroll patient in care coordination services  Encounter Outcome:  No Answer  Care Coordination Interventions Activated:  No   Care Coordination Interventions:  No, not indicated    SIG Nita Whitmire L. Lavina Hamman, RN, BSN, [place] Coordinator Office number [phone]

## 2022-04-29 ENCOUNTER — Encounter: Payer: Self-pay | Admitting: *Deleted

## 2022-04-29 ENCOUNTER — Other Ambulatory Visit: Payer: Self-pay | Admitting: *Deleted

## 2022-04-29 NOTE — Patient Instructions (Addendum)
Visit Information  Thank you for  your outreach with me today.  For further outreach to your cardiology staff, Dr Sallyanne Kuster, please dial [phone] Please attend your May 13, 2022 follow visit with Dr Sallyanne Kuster  Following are the goals we discussed today:   Goals Addressed   None         If you are experiencing a Mental Health or [place] or need someone to talk to, please call the Suicide and Crisis Lifeline: 988 call the [place] National Suicide Prevention Lifeline: (402)848-2185 or TTY: 717-167-8162 TTY 760-125-3967) to talk to a trained counselor call 1-800-273-TALK (toll free, 24 hour hotline) go to [facility] [place] [phone]) call 911   Patient verbalizes understanding of instructions and care plan provided today and agrees to view in [place]. Active MyChart status and patient understanding of how to access instructions and care plan via MyChart confirmed with patient.     No further follow up required: no further needs   SIGNATURE Chinelo Benn L. Lavina Hamman, RN, BSN, [place] Coordinator Office number [phone]
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
## 2022-05-03 ENCOUNTER — Telehealth (HOSPITAL_COMMUNITY): Payer: Self-pay | Admitting: Surgery

## 2022-05-03 NOTE — Telephone Encounter (Signed)
I called patient to remind him to complete ordered home sleep study.  He tells me that he will attempt to complete the study this week.

## 2022-05-13 ENCOUNTER — Ambulatory Visit: Payer: 59 | Admitting: Cardiovascular Disease

## 2022-06-24 ENCOUNTER — Other Ambulatory Visit: Payer: Self-pay

## 2022-06-24 ENCOUNTER — Encounter: Payer: Self-pay | Admitting: Cardiovascular Disease

## 2022-06-24 ENCOUNTER — Ambulatory Visit: Payer: Self-pay | Attending: Cardiovascular Disease | Admitting: Cardiovascular Disease

## 2022-06-24 DIAGNOSIS — I739 Peripheral vascular disease, unspecified: Secondary | ICD-10-CM

## 2022-06-24 MED ORDER — METOPROLOL SUCCINATE ER 25 MG PO TB24: 25.0000 mg | ORAL_TABLET | Freq: Every day | ORAL | 2 refills | Status: DC

## 2022-06-24 MED ORDER — EMPAGLIFLOZIN 10 MG PO TABS: 10.0000 mg | ORAL_TABLET | Freq: Every day | ORAL | 2 refills | Status: AC

## 2022-06-24 MED ORDER — SODIUM CHLORIDE 0.9% FLUSH: 3.0000 mL | Freq: Two times a day (BID) | INTRAVENOUS | Status: DC

## 2022-06-24 MED ORDER — ROSUVASTATIN CALCIUM 40 MG PO TABS: 40.0000 mg | ORAL_TABLET | Freq: Every day | ORAL | 2 refills | Status: DC

## 2022-06-24 NOTE — Assessment & Plan Note (Signed)
[name] returns today to discuss his PAD.  I did begin him on cilostazol which has not afforded any clinical benefit.  He has pain in his feet as well as claudication in his calves.  His Doppler studies performed 03/14/2022 revealed ABIs in the 0.6 range bilaterally with moderate SFA disease and tibial disease as well typical of a diabetic.  He wishes to proceed with outpatient peripheral angiography and potential endovascular therapy.  He is on Eliquis for DVT which will need to be stopped at least 2 days prior to the procedure.

## 2022-06-24 NOTE — Patient Instructions (Addendum)
Medication Instructions:   -Stop taking cilostazol (pletal).  *If you need a refill on your cardiac medications before your next appointment, please call your pharmacy*   Lab Work: Your physician recommends that you have labs drawn today: BMET & CBC  If you have labs (blood work) drawn today and your tests are completely normal, you will receive your results only by: [place] (if you have MyChart) OR A paper copy in the mail If you have any lab test that is abnormal or we need to change your treatment, we will call you to review the results.   Testing/Procedures: Your physician has requested that you have a lower extremity arterial duplex. This test is an ultrasound of the arteries in the legs. It looks at arterial blood flow in the legs. Allow one hour for Lower Arterial scans. There are no restrictions or special instructions. To be done 1-2 weeks after your procedure (10/16). This will be done at [place]  Your physician has requested that you have an ankle brachial index (ABI). During this test an ultrasound and blood pressure cuff are used to evaluate the arteries that supply the arms and legs with blood. Allow thirty minutes for this exam. There are no restrictions or special instructions. To be done 1-2 weeks after your procedure (10/16). This will be done at [place]   Follow-Up: At [facility], you and your health needs are our priority.  As part of our continuing mission to provide you with exceptional heart care, we have created designated Provider Care Teams.  These Care Teams include your primary Cardiologist (physician) and Advanced Practice Providers (APPs -  Physician Assistants and Nurse Practitioners) who all work together to provide you with the care you need, when you need it.  We recommend signing up for the patient portal called "MyChart".  Sign up information is provided on this After Visit Summary.  MyChart is  used to connect with patients for Virtual Visits (Telemedicine).  Patients are able to view lab/test results, encounter notes, upcoming appointments, etc.  Non-urgent messages can be sent to your provider as well.   To learn more about what you can do with MyChart, go to [email or web address].    Your next appointment:   2-3 week(s) after your procedure (10/16)  The format for your next appointment:   In Person  Provider:   Almyra Deforest, PA-C       Then, Quay Burow, MD will plan to see you again in 6 month(s).    Other Instructions       Cardiac/Peripheral Catheterization   You are scheduled for a Peripheral Angiogram on Monday, October 16 with Dr. Quay Burow.  1. Please arrive at the Main Entrance A at [facility]: [place], [place] [address] on October 16 at 12:00 PM (This time is two hours before your procedure to ensure your preparation). Free valet parking service is available. You will check in at ADMITTING. The support person will be asked to wait in the waiting room.  It is OK to have someone drop you off and come back when you are ready to be discharged.        Special note: Every effort is made to have your procedure done on time. Please understand that emergencies sometimes delay scheduled procedures.   . 2. Diet: Do not eat solid foods after midnight.  You may have clear liquids until 5 AM the day of the  procedure.  3. Labs: You will need to have blood drawn today (10/6)  4. Medication instructions in preparation for your procedure:    Stop taking Eliquis (Apixiban) on Friday, October 13.  Stop taking, Lasix (Furosemide)  and Spironolactone Monday, October 16,   On the morning of your procedure, take Aspirin 81 mg and any morning medicines NOT listed above.  You may use sips of water.  5. Pack an over night bag, you will most likely stay at the hospital for one night. 6. You MUST have a responsible adult to drive you home. 7. An  adult MUST be with you the first 24 hours after you arrive home. 8. Bring a current list of your medications, and the last time and date medication taken. 9. Bring ID and current insurance cards. 10.Please wear clothes that are easy to get on and off and wear slip-on shoes.  Thank you for allowing [place] to care for you!   -- [place] [facility]

## 2022-06-24 NOTE — Progress Notes (Signed)
06/24/2022 Tommy Jimenez   March 24, 1966  [id number]  Primary Physician Tommy Frames, PA-C Primary Cardiologist: Tommy Harp MD Tommy Jimenez, [place]  HPI:  Tommy Jimenez is a 55 y.o.  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  He was referred by Tommy Memos, FNP for peripheral vascular evaluation because of claudication.  I last saw him in the office 03/25/2022.  His risk factors include ongoing tobacco abuse, treated hypertension, diabetes and hyperlipidemia.  His father and sister both had CAD.  He had an a possible cardiac arrest anterior STEMI 05/2017 and underwent cath at [facility] revealing occluded ostial LAD.  He had PCI drug-eluting stenting.  Does have ischemic cardiomyopathy with an EF in the 30 to 35% range on guideline directed optimal medical therapy.  He is followed by the heart failure clinic.  He has had a DVT in the past as well as a PFO.  He complains of bilateral calf claudication which is symmetric, lifestyle-limiting for the last year.  He had Doppler studies performed 03/14/2022 revealing a right ABI of 0.62 and left of 0.69.  He had moderate distal SFA disease bilaterally along with tibial vessel disease typical of a diabetic.  He also relates symptoms compatible with diabetic peripheral neuropathy.  Since I saw him 3 months ago I did begin him on cilostazol which has afforded him no significant relief.  He still complains of pain in his feet as well as calf claudication.  His Dopplers did show moderate SFA disease bilaterally with typical tibial disease or diabetic.  He wishes to proceed with outpatient peripheral angiography and potential endovascular therapy.     Current Meds  Medication Sig   apixaban (ELIQUIS) 5 MG TABS tablet Take 1 tablet (5 mg total) by mouth 2 (two) times daily.   cilostazol (PLETAL) 50 MG tablet Take 1 tablet (50 mg total)  by mouth 2 (two) times daily.   furosemide (LASIX) 20 MG tablet Take 1 tablet (20 mg total) by mouth as needed.   insulin isophane & regular human KwikPen (NOVOLIN 70/30 KWIKPEN) (70-30) 100 UNIT/ML KwikPen Inject 22 Units into the skin 2 (two) times daily before a meal.   Insulin Pen Needle (PEN NEEDLES 3/16") 31G X 5 MM MISC Use as directed with insulin pen   penicillin v potassium (VEETID) 500 MG tablet Take 1 tablet (500 mg total) by mouth 4 (four) times daily for 7 days   sacubitril-valsartan (ENTRESTO) 49-51 MG Take 1 tablet by mouth 2 (two) times daily.   spironolactone (ALDACTONE) 25 MG tablet Take 1 tablet (25 mg total) by mouth daily.   [DISCONTINUED] empagliflozin (JARDIANCE) 10 MG TABS tablet Take 1 tablet (10 mg total) by mouth daily before breakfast.   [DISCONTINUED] glipiZIDE (GLIPIZIDE XL) 10 MG 24 hr tablet 1 tab(s) orally once a day for 30 days   [DISCONTINUED] metoprolol succinate (TOPROL XL) 25 MG 24 hr tablet Take 1 tablet (25 mg total) by mouth at bedtime.   [DISCONTINUED] rosuvastatin (CRESTOR) 40 MG tablet Take 1 tablet (40 mg total) by mouth daily.     No Known Allergies  Social History   Socioeconomic History   Marital status: Legally Separated    Spouse name: Not on file   Number of children: Not on file   Years of education: Not on file   Highest education level: Not on file  Occupational History   Not  on file  Tobacco Use   Smoking status: Some Days    Years: 20.00    Types: Cigarettes   Smokeless tobacco: Never   Tobacco comments:    Former smoker x 20 + years but quit after his recent hospitalization 12/03/21  Vaping Use   Vaping Use: Never used  Substance and Sexual Activity   Alcohol use: Yes    Comment: socially - beer   Drug use: No   Sexual activity: Not on file  Other Topics Concern   Not on file  Social History Narrative   Former smoker x 20 + years but quit after his recent hospitalization   Social Determinants of Health   Financial  Resource Strain: High Risk (03/21/2022)   Overall Financial Resource Strain (CARDIA)    Difficulty of Paying Living Expenses: Hard  Food Insecurity: No Food Insecurity (04/29/2022)   Hunger Vital Sign    Worried About Running Out of Food in the Last Year: Never true    [place] in the Last Year: Never true  Transportation Needs: Unmet Transportation Needs (04/29/2022)   PRAPARE - Transportation    Lack of Transportation (Medical): Yes    Lack of Transportation (Non-Medical): Yes  Physical Activity: Not on file  Stress: No Stress Concern Present (12/22/2021)   [place]    Feeling of Stress : Only a little  Social Connections: Moderately Integrated (12/22/2021)   Social Connection and Isolation Panel [NHANES]    Frequency of Communication with Friends and Family: Twice a week    Frequency of Social Gatherings with Friends and Family: Twice a week    Attends Religious Services: 1 to 4 times per year    Active Member of [employer] or Organizations: Yes    Attends Archivist Meetings: 1 to 4 times per year    Marital Status: Separated  Intimate Partner Violence: Not At Risk (12/22/2021)   Humiliation, Afraid, Rape, and Kick questionnaire    Fear of Current or Ex-Partner: No    Emotionally Abused: No    Physically Abused: No    Sexually Abused: No     Review of Systems: General: negative for chills, fever, night sweats or weight changes.  Cardiovascular: negative for chest pain, dyspnea on exertion, edema, orthopnea, palpitations, paroxysmal nocturnal dyspnea or shortness of breath Dermatological: negative for rash Respiratory: negative for cough or wheezing Urologic: negative for hematuria Abdominal: negative for nausea, vomiting, diarrhea, bright red blood per rectum, melena, or hematemesis Neurologic: negative for visual changes, syncope, or dizziness All other systems reviewed and are otherwise negative except  as noted above.    Blood pressure 124/68, pulse 89, height '5\' 8"'$  (1.727 m), weight 141 lb (64 kg), SpO2 100 %.  General appearance: alert and no distress Neck: no adenopathy, no carotid bruit, no JVD, supple, symmetrical, trachea midline, and thyroid not enlarged, symmetric, no tenderness/mass/nodules Lungs: clear to auscultation bilaterally Heart: regular rate and rhythm, S1, S2 normal, no murmur, click, rub or gallop Extremities: extremities normal, atraumatic, no cyanosis or edema Pulses: 2+ and symmetric Skin: Skin color, texture, turgor normal. No rashes or lesions Neurologic: Grossly normal  EKG not performed today  ASSESSMENT AND PLAN:   Claudication in peripheral vascular disease [facility]) Mr. Hubbard Robinson returns today to discuss his PAD.  I did begin him on cilostazol which has not afforded any clinical benefit.  He has pain in his feet as well as claudication in his calves.  His Doppler studies performed 03/14/2022 revealed ABIs in the 0.6 range bilaterally with moderate SFA disease and tibial disease as well typical of a diabetic.  He wishes to proceed with outpatient peripheral angiography and potential endovascular therapy.  He is on Eliquis for DVT which will need to be stopped at least 2 days prior to the procedure.     Tommy Harp MD FACP,FACC,FAHA, [facility] 06/24/2022 9:54 AM

## 2022-06-24 NOTE — H&P (View-Only) (Signed)
06/24/2022 Tommy Jimenez   [birth year]  [id number]  Primary Physician Tommy Frames, PA-C Primary Cardiologist: Tommy Harp MD Tommy Jimenez, [place]  HPI:  Tommy Jimenez is a 56 y.o.  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  He was referred by Tommy Memos, FNP for peripheral vascular evaluation because of claudication.  I last saw him in the office 03/25/2022.  His risk factors include ongoing tobacco abuse, treated hypertension, diabetes and hyperlipidemia.  His father and sister both had CAD.  He had an a possible cardiac arrest anterior STEMI 05/2017 and underwent cath at [facility] revealing occluded ostial LAD.  He had PCI drug-eluting stenting.  Does have ischemic cardiomyopathy with an EF in the 30 to 35% range on guideline directed optimal medical therapy.  He is followed by the heart failure clinic.  He has had a DVT in the past as well as a PFO.  He complains of bilateral calf claudication which is symmetric, lifestyle-limiting for the last year.  He had Doppler studies performed 03/14/2022 revealing a right ABI of 0.62 and left of 0.69.  He had moderate distal SFA disease bilaterally along with tibial vessel disease typical of a diabetic.  He also relates symptoms compatible with diabetic peripheral neuropathy.  Since I saw him 3 months ago I did begin him on cilostazol which has afforded him no significant relief.  He still complains of pain in his feet as well as calf claudication.  His Dopplers did show moderate SFA disease bilaterally with typical tibial disease or diabetic.  He wishes to proceed with outpatient peripheral angiography and potential endovascular therapy.     Current Meds  Medication Sig   apixaban (ELIQUIS) 5 MG TABS tablet Take 1 tablet (5 mg total) by mouth 2 (two) times daily.   cilostazol (PLETAL) 50 MG tablet Take 1 tablet (50 mg total)  by mouth 2 (two) times daily.   furosemide (LASIX) 20 MG tablet Take 1 tablet (20 mg total) by mouth as needed.   insulin isophane & regular human KwikPen (NOVOLIN 70/30 KWIKPEN) (70-30) 100 UNIT/ML KwikPen Inject 22 Units into the skin 2 (two) times daily before a meal.   Insulin Pen Needle (PEN NEEDLES 3/16") 31G X 5 MM MISC Use as directed with insulin pen   penicillin v potassium (VEETID) 500 MG tablet Take 1 tablet (500 mg total) by mouth 4 (four) times daily for 7 days   sacubitril-valsartan (ENTRESTO) 49-51 MG Take 1 tablet by mouth 2 (two) times daily.   spironolactone (ALDACTONE) 25 MG tablet Take 1 tablet (25 mg total) by mouth daily.   [DISCONTINUED] empagliflozin (JARDIANCE) 10 MG TABS tablet Take 1 tablet (10 mg total) by mouth daily before breakfast.   [DISCONTINUED] glipiZIDE (GLIPIZIDE XL) 10 MG 24 hr tablet 1 tab(s) orally once a day for 30 days   [DISCONTINUED] metoprolol succinate (TOPROL XL) 25 MG 24 hr tablet Take 1 tablet (25 mg total) by mouth at bedtime.   [DISCONTINUED] rosuvastatin (CRESTOR) 40 MG tablet Take 1 tablet (40 mg total) by mouth daily.     No Known Allergies  Social History   Socioeconomic History   Marital status: Legally Separated    Spouse name: Not on file   Number of children: Not on file   Years of education: Not on file   Highest education level: Not on file  Occupational History   Not  on file  Tobacco Use   Smoking status: Some Days    Years: 20.00    Types: Cigarettes   Smokeless tobacco: Never   Tobacco comments:    Former smoker x 20 + years but quit after his recent hospitalization 12/03/21  Vaping Use   Vaping Use: Never used  Substance and Sexual Activity   Alcohol use: Yes    Comment: socially - beer   Drug use: No   Sexual activity: Not on file  Other Topics Concern   Not on file  Social History Narrative   Former smoker x 20 + years but quit after his recent hospitalization   Social Determinants of Health   Financial  Resource Strain: High Risk (03/21/2022)   Overall Financial Resource Strain (CARDIA)    Difficulty of Paying Living Expenses: Hard  Food Insecurity: No Food Insecurity (04/29/2022)   Hunger Vital Sign    Worried About Running Out of Food in the Last Year: Never true    [place] in the Last Year: Never true  Transportation Needs: Unmet Transportation Needs (04/29/2022)   PRAPARE - Transportation    Lack of Transportation (Medical): Yes    Lack of Transportation (Non-Medical): Yes  Physical Activity: Not on file  Stress: No Stress Concern Present (12/22/2021)   [place]    Feeling of Stress : Only a little  Social Connections: Moderately Integrated (12/22/2021)   Social Connection and Isolation Panel [NHANES]    Frequency of Communication with Friends and Family: Twice a week    Frequency of Social Gatherings with Friends and Family: Twice a week    Attends Religious Services: 1 to 4 times per year    Active Member of [employer] or Organizations: Yes    Attends Archivist Meetings: 1 to 4 times per year    Marital Status: Separated  Intimate Partner Violence: Not At Risk (12/22/2021)   Humiliation, Afraid, Rape, and Kick questionnaire    Fear of Current or Ex-Partner: No    Emotionally Abused: No    Physically Abused: No    Sexually Abused: No     Review of Systems: General: negative for chills, fever, night sweats or weight changes.  Cardiovascular: negative for chest pain, dyspnea on exertion, edema, orthopnea, palpitations, paroxysmal nocturnal dyspnea or shortness of breath Dermatological: negative for rash Respiratory: negative for cough or wheezing Urologic: negative for hematuria Abdominal: negative for nausea, vomiting, diarrhea, bright red blood per rectum, melena, or hematemesis Neurologic: negative for visual changes, syncope, or dizziness All other systems reviewed and are otherwise negative except  as noted above.    Blood pressure 124/68, pulse 89, height '5\' 8"'$  (1.727 m), weight 141 lb (64 kg), SpO2 100 %.  General appearance: alert and no distress Neck: no adenopathy, no carotid bruit, no JVD, supple, symmetrical, trachea midline, and thyroid not enlarged, symmetric, no tenderness/mass/nodules Lungs: clear to auscultation bilaterally Heart: regular rate and rhythm, S1, S2 normal, no murmur, click, rub or gallop Extremities: extremities normal, atraumatic, no cyanosis or edema Pulses: 2+ and symmetric Skin: Skin color, texture, turgor normal. No rashes or lesions Neurologic: Grossly normal  EKG not performed today  ASSESSMENT AND PLAN:   Claudication in peripheral vascular disease [facility]) Mr. Hubbard Robinson returns today to discuss his PAD.  I did begin him on cilostazol which has not afforded any clinical benefit.  He has pain in his feet as well as claudication in his calves.  His Doppler studies performed 03/14/2022 revealed ABIs in the 0.6 range bilaterally with moderate SFA disease and tibial disease as well typical of a diabetic.  He wishes to proceed with outpatient peripheral angiography and potential endovascular therapy.  He is on Eliquis for DVT which will need to be stopped at least 2 days prior to the procedure.     Tommy Harp MD FACP,FACC,FAHA, [facility] 06/24/2022 9:54 AM

## 2022-06-25 LAB — BASIC METABOLIC PANEL
BUN/Creatinine Ratio: 15 (ref 9–20)
BUN: 15 mg/dL (ref 6–24)
CO2: 23 mmol/L (ref 20–29)
Calcium: 9.3 mg/dL (ref 8.7–10.2)
Chloride: 104 mmol/L (ref 96–106)
Creatinine, Ser: 0.98 mg/dL (ref 0.76–1.27)
Glucose: 197 mg/dL — ABNORMAL HIGH (ref 70–99)
Potassium: 4.8 mmol/L (ref 3.5–5.2)
Sodium: 142 mmol/L (ref 134–144)
eGFR: 91 mL/min/{1.73_m2} (ref >59)

## 2022-06-25 LAB — CBC
Hematocrit: 39.3 % (ref 37.5–51.0)
Hemoglobin: 12.6 g/dL — ABNORMAL LOW (ref 13.0–17.7)
MCH: 27.7 pg (ref 26.6–33.0)
MCHC: 32.1 g/dL (ref 31.5–35.7)
MCV: 86 fL (ref 79–97)
Platelets: 335 10*3/uL (ref 150–450)
RBC: 4.55 x10E6/uL (ref 4.14–5.80)
RDW: 13.7 % (ref 11.6–15.4)
WBC: 7.7 10*3/uL (ref 3.4–10.8)

## 2022-06-30 ENCOUNTER — Telehealth: Payer: Self-pay | Admitting: *Deleted

## 2022-06-30 NOTE — Telephone Encounter (Signed)
Abdominal Aortogram scheduled at [facility] for: Monday July 04, 2022 2 PM Arrival time and place: [place] Entrance A at: 14 Noon  Nothing to eat after midnight prior to procedure, clear liquids until 5 AM day of procedure.  Medication instructions: -Hold:  Eliquis-none 07/02/22 until post procedure  Lasix/Spironolactone-AM of procedure  Insulin/Jardiance-AM of procedure  Patient reports he does not use Insulin HS. -Except hold medications usual morning medications can be taken with sips of water including aspirin 81 mg.  Confirmed patient has responsible adult to drive home post procedure and be with patient first 24 hours after arriving home.  Patient reports no new symptoms concerning for COVID-19 in the past 10 days.  Reviewed procedure instructions with patient.

## 2022-07-04 ENCOUNTER — Other Ambulatory Visit: Payer: Self-pay

## 2022-07-04 ENCOUNTER — Ambulatory Visit (HOSPITAL_COMMUNITY)
Admission: RE | Admit: 2022-07-04 | Discharge: 2022-07-05 | Disposition: A | Discharge status: Home / Self Care | Payer: Self-pay | Source: Ambulatory Visit | Attending: Cardiovascular Disease | Admitting: Cardiovascular Disease

## 2022-07-04 ENCOUNTER — Encounter (HOSPITAL_COMMUNITY): Payer: Self-pay | Admitting: Cardiovascular Disease

## 2022-07-04 ENCOUNTER — Encounter (HOSPITAL_COMMUNITY)
Admission: RE | Disposition: A | Discharge status: Home / Self Care | Payer: Self-pay | Source: Ambulatory Visit | Attending: Cardiovascular Disease

## 2022-07-04 DIAGNOSIS — I70223 Atherosclerosis of native arteries of extremities with rest pain, bilateral legs: Secondary | ICD-10-CM | POA: Insufficient documentation

## 2022-07-04 DIAGNOSIS — I110 Hypertensive heart disease with heart failure: Secondary | ICD-10-CM | POA: Insufficient documentation

## 2022-07-04 DIAGNOSIS — I5042 Chronic combined systolic (congestive) and diastolic (congestive) heart failure: Secondary | ICD-10-CM | POA: Insufficient documentation

## 2022-07-04 DIAGNOSIS — Z7901 Long term (current) use of anticoagulants: Secondary | ICD-10-CM | POA: Insufficient documentation

## 2022-07-04 DIAGNOSIS — E1151 Type 2 diabetes mellitus with diabetic peripheral angiopathy without gangrene: Secondary | ICD-10-CM | POA: Insufficient documentation

## 2022-07-04 DIAGNOSIS — E785 Hyperlipidemia, unspecified: Secondary | ICD-10-CM | POA: Insufficient documentation

## 2022-07-04 DIAGNOSIS — I2510 Atherosclerotic heart disease of native coronary artery without angina pectoris: Secondary | ICD-10-CM | POA: Insufficient documentation

## 2022-07-04 DIAGNOSIS — Z8674 Personal history of sudden cardiac arrest: Secondary | ICD-10-CM | POA: Insufficient documentation

## 2022-07-04 DIAGNOSIS — Z87891 Personal history of nicotine dependence: Secondary | ICD-10-CM | POA: Insufficient documentation

## 2022-07-04 DIAGNOSIS — I739 Peripheral vascular disease, unspecified: Secondary | ICD-10-CM

## 2022-07-04 DIAGNOSIS — Z7902 Long term (current) use of antithrombotics/antiplatelets: Secondary | ICD-10-CM | POA: Insufficient documentation

## 2022-07-04 DIAGNOSIS — I70212 Atherosclerosis of native arteries of extremities with intermittent claudication, left leg: Secondary | ICD-10-CM

## 2022-07-04 DIAGNOSIS — Z794 Long term (current) use of insulin: Secondary | ICD-10-CM | POA: Insufficient documentation

## 2022-07-04 DIAGNOSIS — I255 Ischemic cardiomyopathy: Secondary | ICD-10-CM | POA: Insufficient documentation

## 2022-07-04 DIAGNOSIS — I708 Atherosclerosis of other arteries: Secondary | ICD-10-CM | POA: Insufficient documentation

## 2022-07-04 DIAGNOSIS — I252 Old myocardial infarction: Secondary | ICD-10-CM | POA: Insufficient documentation

## 2022-07-04 DIAGNOSIS — Z86718 Personal history of other venous thrombosis and embolism: Secondary | ICD-10-CM | POA: Insufficient documentation

## 2022-07-04 HISTORY — PX: ABDOMINAL AORTOGRAM W/LOWER EXTREMITY: CATH118223

## 2022-07-04 HISTORY — PX: PERIPHERAL VASCULAR BALLOON ANGIOPLASTY: CATH118281

## 2022-07-04 HISTORY — PX: PERIPHERAL VASCULAR INTERVENTION: CATH118257

## 2022-07-04 LAB — POCT ACTIVATED CLOTTING TIME
Activated Clotting Time: 233 seconds
Activated Clotting Time: 269 seconds
Activated Clotting Time: 281 seconds
Activated Clotting Time: 251 seconds
Activated Clotting Time: 203 seconds
Activated Clotting Time: 179 seconds

## 2022-07-04 LAB — GLUCOSE, CAPILLARY
Glucose-Capillary: 139 mg/dL — ABNORMAL HIGH (ref 70–99)
Glucose-Capillary: 103 mg/dL — ABNORMAL HIGH (ref 70–99)

## 2022-07-04 SURGERY — ABDOMINAL AORTOGRAM W/LOWER EXTREMITY
Anesthesia: LOCAL

## 2022-07-04 MED ORDER — INSULIN ASPART PROT & ASPART (70-30 MIX) 100 UNIT/ML ~~LOC~~ SUSP
22.0000 [IU] | Freq: Two times a day (BID) | SUBCUTANEOUS | Status: DC
  Administered 2022-07-05: 22 [IU] via SUBCUTANEOUS
  Filled 2022-07-04: qty 10

## 2022-07-04 MED ORDER — FUROSEMIDE 20 MG PO TABS: 20.0000 mg | ORAL_TABLET | ORAL | Status: DC | PRN

## 2022-07-04 MED ORDER — ASPIRIN 81 MG PO TBEC
81.0000 mg | DELAYED_RELEASE_TABLET | Freq: Every day | ORAL | Status: DC
  Administered 2022-07-05: 81 mg via ORAL
  Filled 2022-07-04: qty 1

## 2022-07-04 MED ORDER — IODIXANOL 320 MG/ML IV SOLN
INTRAVENOUS | Status: DC | PRN
  Administered 2022-07-04: 140 mL

## 2022-07-04 MED ORDER — LABETALOL HCL 5 MG/ML IV SOLN: 10.0000 mg | INTRAVENOUS | Status: DC | PRN

## 2022-07-04 MED ORDER — LIDOCAINE HCL (PF) 1 % IJ SOLN
INTRAMUSCULAR | Status: AC
  Filled 2022-07-04: qty 30

## 2022-07-04 MED ORDER — SODIUM CHLORIDE 0.9 % IV SOLN: 250.0000 mL | INTRAVENOUS | Status: DC | PRN

## 2022-07-04 MED ORDER — SACUBITRIL-VALSARTAN 49-51 MG PO TABS
1.0000 | ORAL_TABLET | Freq: Two times a day (BID) | ORAL | Status: DC
  Administered 2022-07-05 (×2): 1 via ORAL
  Filled 2022-07-04 (×2): qty 1

## 2022-07-04 MED ORDER — CLOPIDOGREL BISULFATE 75 MG PO TABS
75.0000 mg | ORAL_TABLET | Freq: Every day | ORAL | Status: DC
  Administered 2022-07-05: 75 mg via ORAL
  Filled 2022-07-04: qty 1

## 2022-07-04 MED ORDER — ROSUVASTATIN CALCIUM 20 MG PO TABS
40.0000 mg | ORAL_TABLET | Freq: Every day | ORAL | Status: DC
  Administered 2022-07-05: 40 mg via ORAL
  Filled 2022-07-04: qty 2

## 2022-07-04 MED ORDER — FENTANYL CITRATE (PF) 100 MCG/2ML IJ SOLN
INTRAMUSCULAR | Status: AC
  Filled 2022-07-04: qty 2

## 2022-07-04 MED ORDER — MIDAZOLAM HCL 2 MG/2ML IJ SOLN
INTRAMUSCULAR | Status: AC
  Filled 2022-07-04: qty 2

## 2022-07-04 MED ORDER — ASPIRIN 81 MG PO CHEW
81.0000 mg | CHEWABLE_TABLET | ORAL | Status: AC
  Administered 2022-07-04: 81 mg via ORAL
  Filled 2022-07-04: qty 1

## 2022-07-04 MED ORDER — HEPARIN SODIUM (PORCINE) 1000 UNIT/ML IJ SOLN
INTRAMUSCULAR | Status: DC | PRN
  Administered 2022-07-04: 4000 [IU] via INTRAVENOUS
  Administered 2022-07-04: 2000 [IU] via INTRAVENOUS
  Administered 2022-07-04: 7000 [IU] via INTRAVENOUS

## 2022-07-04 MED ORDER — HEPARIN (PORCINE) IN NACL 2000-0.9 UNIT/L-% IV SOLN: INTRAVENOUS | Status: DC | PRN

## 2022-07-04 MED ORDER — EMPAGLIFLOZIN 10 MG PO TABS
10.0000 mg | ORAL_TABLET | Freq: Every day | ORAL | Status: DC
  Administered 2022-07-05: 10 mg via ORAL
  Filled 2022-07-04: qty 1

## 2022-07-04 MED ORDER — SODIUM CHLORIDE 0.9 % IV SOLN: INTRAVENOUS | Status: AC

## 2022-07-04 MED ORDER — SODIUM CHLORIDE 0.9% FLUSH: 3.0000 mL | INTRAVENOUS | Status: DC | PRN

## 2022-07-04 MED ORDER — FENTANYL CITRATE (PF) 100 MCG/2ML IJ SOLN
INTRAMUSCULAR | Status: DC | PRN
  Administered 2022-07-04 (×2): 25 ug via INTRAVENOUS

## 2022-07-04 MED ORDER — MORPHINE SULFATE (PF) 2 MG/ML IV SOLN
2.0000 mg | INTRAVENOUS | Status: DC | PRN
  Administered 2022-07-05: 2 mg via INTRAVENOUS
  Filled 2022-07-04: qty 1

## 2022-07-04 MED ORDER — CLOPIDOGREL BISULFATE 300 MG PO TABS
ORAL_TABLET | ORAL | Status: DC | PRN
  Administered 2022-07-04: 300 mg via ORAL

## 2022-07-04 MED ORDER — LIDOCAINE HCL (PF) 1 % IJ SOLN
INTRAMUSCULAR | Status: DC | PRN
  Administered 2022-07-04: 25 mL

## 2022-07-04 MED ORDER — SODIUM CHLORIDE 0.9 % WEIGHT BASED INFUSION: 1.0000 mL/kg/h | INTRAVENOUS | Status: DC

## 2022-07-04 MED ORDER — MIDAZOLAM HCL 2 MG/2ML IJ SOLN
INTRAMUSCULAR | Status: DC | PRN
  Administered 2022-07-04 (×2): 1 mg via INTRAVENOUS

## 2022-07-04 MED ORDER — ONDANSETRON HCL 4 MG/2ML IJ SOLN: 4.0000 mg | Freq: Four times a day (QID) | INTRAMUSCULAR | Status: DC | PRN

## 2022-07-04 MED ORDER — METOPROLOL SUCCINATE ER 25 MG PO TB24
25.0000 mg | ORAL_TABLET | Freq: Every day | ORAL | Status: DC
  Administered 2022-07-05: 25 mg via ORAL
  Filled 2022-07-04: qty 1

## 2022-07-04 MED ORDER — CLOPIDOGREL BISULFATE 300 MG PO TABS
ORAL_TABLET | ORAL | Status: AC
  Filled 2022-07-04: qty 1

## 2022-07-04 MED ORDER — ACETAMINOPHEN 325 MG PO TABS
650.0000 mg | ORAL_TABLET | ORAL | Status: DC | PRN
  Administered 2022-07-04 – 2022-07-05 (×2): 650 mg via ORAL
  Filled 2022-07-04 (×2): qty 2

## 2022-07-04 MED ORDER — SODIUM CHLORIDE 0.9% FLUSH
3.0000 mL | Freq: Two times a day (BID) | INTRAVENOUS | Status: DC
  Administered 2022-07-05: 3 mL via INTRAVENOUS

## 2022-07-04 MED ORDER — HYDRALAZINE HCL 20 MG/ML IJ SOLN: 5.0000 mg | INTRAMUSCULAR | Status: DC | PRN

## 2022-07-04 MED ORDER — SPIRONOLACTONE 25 MG PO TABS
25.0000 mg | ORAL_TABLET | Freq: Every day | ORAL | Status: DC
  Administered 2022-07-05: 25 mg via ORAL
  Filled 2022-07-04: qty 1

## 2022-07-04 MED ORDER — HEPARIN (PORCINE) IN NACL 1000-0.9 UT/500ML-% IV SOLN
INTRAVENOUS | Status: DC | PRN
  Administered 2022-07-04 (×2): 500 mL

## 2022-07-04 MED ORDER — SODIUM CHLORIDE 0.9 % WEIGHT BASED INFUSION
3.0000 mL/kg/h | INTRAVENOUS | Status: DC
  Administered 2022-07-04: 3 mL/kg/h via INTRAVENOUS

## 2022-07-04 MED ORDER — HEPARIN SODIUM (PORCINE) 1000 UNIT/ML IJ SOLN
INTRAMUSCULAR | Status: AC
  Filled 2022-07-04: qty 10

## 2022-07-04 SURGICAL SUPPLY — 22 items
BALLN CHOCOLATE 3.5X120X135 (BALLOONS) ×2
BALLN IN.PACT DCB 4X150 (BALLOONS) ×2
BALLN MUSTANG 6.0X20 75 (BALLOONS) ×2
BALLOON CHOCOLATE 3.5X120X135 (BALLOONS) IMPLANT
BALLOON MUSTANG 6.0X20 75 (BALLOONS) IMPLANT
CATH ANGIO 5F PIGTAIL 65CM (CATHETERS) IMPLANT
CATH CROSS OVER TEMPO 5F (CATHETERS) IMPLANT
DCB IN.PACT 4X150 (BALLOONS) IMPLANT
GUIDEWIRE ANGLED .035X150CM (WIRE) IMPLANT
KIT MICROPUNCTURE NIT STIFF (SHEATH) IMPLANT
KIT PV (KITS) ×2 IMPLANT
SHEATH HIGHFLEX ANSEL 6FRX55 (SHEATH) IMPLANT
SHEATH PINNACLE 5F 10CM (SHEATH) IMPLANT
SHEATH PINNACLE 6F 10CM (SHEATH) IMPLANT
STENT ABSOLUTE PRO 7X40X135 (Permanent Stent) IMPLANT
SYR MEDRAD MARK 7 150ML (SYRINGE) ×2 IMPLANT
TAPE SHOOT N SEE (TAPE) IMPLANT
TRANSDUCER W/STOPCOCK (MISCELLANEOUS) ×2 IMPLANT
TRAY PV CATH (CUSTOM PROCEDURE TRAY) ×2 IMPLANT
WIRE HI TORQ VERSACORE 300 (WIRE) IMPLANT
WIRE HITORQ VERSACORE ST 145CM (WIRE) IMPLANT
WIRE SHEPHERD 6G .014 (WIRE) IMPLANT

## 2022-07-04 NOTE — Progress Notes (Signed)
6Fr sheath aspirated and removed from right femoral artery. Manual pressure applied for 20 minutes. Site level 0 , no s+s of hematoma. Tegaderm dressing applied, bedrest instuctions given.   Bilateral dp and pt pulses present with doppler.   Bedrest begins at 21:25:00

## 2022-07-04 NOTE — Interval H&P Note (Signed)
History and Physical Interval Note:  07/04/2022 3:18 PM  Tommy Jimenez  has presented today for surgery, with the diagnosis of PAD.  The various methods of treatment have been discussed with the patient and family. After consideration of risks, benefits and other options for treatment, the patient has consented to  Procedure(s): ABDOMINAL AORTOGRAM W/LOWER EXTREMITY (N/A) as a surgical intervention.  The patient's history has been reviewed, patient examined, no change in status, stable for surgery.  I have reviewed the patient's chart and labs.  Questions were answered to the patient's satisfaction.     Quay Burow

## 2022-07-05 ENCOUNTER — Other Ambulatory Visit (HOSPITAL_COMMUNITY): Payer: Self-pay

## 2022-07-05 ENCOUNTER — Encounter (HOSPITAL_COMMUNITY): Payer: Self-pay | Admitting: Cardiovascular Disease

## 2022-07-05 DIAGNOSIS — I739 Peripheral vascular disease, unspecified: Secondary | ICD-10-CM

## 2022-07-05 DIAGNOSIS — E7800 Pure hypercholesterolemia, unspecified: Secondary | ICD-10-CM

## 2022-07-05 LAB — CBC
HCT: 32.1 % — ABNORMAL LOW (ref 39.0–52.0)
Hemoglobin: 10.6 g/dL — ABNORMAL LOW (ref 13.0–17.0)
MCH: 27.9 pg (ref 26.0–34.0)
MCHC: 33 g/dL (ref 30.0–36.0)
MCV: 84.5 fL (ref 80.0–100.0)
Platelets: 280 10*3/uL (ref 150–400)
RBC: 3.8 MIL/uL — ABNORMAL LOW (ref 4.22–5.81)
RDW: 13.6 % (ref 11.5–15.5)
WBC: 7.8 10*3/uL (ref 4.0–10.5)
nRBC: 0 % (ref 0.0–0.2)

## 2022-07-05 LAB — BASIC METABOLIC PANEL
Anion gap: 4 — ABNORMAL LOW (ref 5–15)
BUN: 9 mg/dL (ref 6–20)
CO2: 26 mmol/L (ref 22–32)
Calcium: 8 mg/dL — ABNORMAL LOW (ref 8.9–10.3)
Chloride: 105 mmol/L (ref 98–111)
Creatinine, Ser: 0.98 mg/dL (ref 0.61–1.24)
GFR, Estimated: >60 mL/min (ref >60)
Glucose, Bld: 351 mg/dL — ABNORMAL HIGH (ref 70–99)
Potassium: 3.2 mmol/L — ABNORMAL LOW (ref 3.5–5.1)
Sodium: 135 mmol/L (ref 135–145)

## 2022-07-05 LAB — GLUCOSE, CAPILLARY
Glucose-Capillary: 214 mg/dL — ABNORMAL HIGH (ref 70–99)
Glucose-Capillary: 72 mg/dL (ref 70–99)

## 2022-07-05 LAB — HEMOGLOBIN AND HEMATOCRIT, BLOOD
HCT: 34.3 % — ABNORMAL LOW (ref 39.0–52.0)
Hemoglobin: 11.5 g/dL — ABNORMAL LOW (ref 13.0–17.0)

## 2022-07-05 LAB — LIPID PANEL
Cholesterol: 181 mg/dL (ref 0–200)
HDL: 40 mg/dL — ABNORMAL LOW (ref >40)
LDL Cholesterol: 115 mg/dL — ABNORMAL HIGH (ref 0–99)
Total CHOL/HDL Ratio: 4.5 RATIO
Triglycerides: 130 mg/dL (ref <150)
VLDL: 26 mg/dL (ref 0–40)

## 2022-07-05 MED ORDER — METOPROLOL SUCCINATE ER 25 MG PO TB24
25.0000 mg | ORAL_TABLET | Freq: Every day | ORAL | 2 refills | Status: AC
  Filled 2022-07-05: qty 30, 30d supply, fill #0

## 2022-07-05 MED ORDER — SPIRONOLACTONE 25 MG PO TABS
12.5000 mg | ORAL_TABLET | Freq: Every day | ORAL | 3 refills | Status: AC
  Filled 2022-07-05: qty 15, 30d supply, fill #0

## 2022-07-05 MED ORDER — FUROSEMIDE 20 MG PO TABS
20.0000 mg | ORAL_TABLET | ORAL | 5 refills | Status: AC | PRN
  Filled 2022-07-05: qty 30, 30d supply, fill #0

## 2022-07-05 MED ORDER — EZETIMIBE 10 MG PO TABS
10.0000 mg | ORAL_TABLET | Freq: Every day | ORAL | Status: DC
  Administered 2022-07-05: 10 mg via ORAL
  Filled 2022-07-05: qty 1

## 2022-07-05 MED ORDER — ORAL CARE MOUTH RINSE: 15.0000 mL | OROMUCOSAL | Status: DC | PRN

## 2022-07-05 MED ORDER — ASPIRIN 81 MG PO TBEC
81.0000 mg | DELAYED_RELEASE_TABLET | Freq: Every day | ORAL | 12 refills | Status: AC
  Filled 2022-07-05: qty 30, 30d supply, fill #0

## 2022-07-05 MED ORDER — EZETIMIBE 10 MG PO TABS
10.0000 mg | ORAL_TABLET | Freq: Every day | ORAL | 3 refills | Status: AC
  Filled 2022-07-05: qty 30, 30d supply, fill #0

## 2022-07-05 MED ORDER — CLOPIDOGREL BISULFATE 75 MG PO TABS
75.0000 mg | ORAL_TABLET | Freq: Every day | ORAL | 3 refills | Status: AC
  Filled 2022-07-05: qty 30, 30d supply, fill #0

## 2022-07-05 MED ORDER — POTASSIUM CHLORIDE CRYS ER 20 MEQ PO TBCR
40.0000 meq | EXTENDED_RELEASE_TABLET | Freq: Once | ORAL | Status: AC
  Administered 2022-07-05: 40 meq via ORAL
  Filled 2022-07-05: qty 2

## 2022-07-05 MED ORDER — ROSUVASTATIN CALCIUM 40 MG PO TABS
40.0000 mg | ORAL_TABLET | Freq: Every day | ORAL | 2 refills | Status: AC
  Filled 2022-07-05: qty 30, 30d supply, fill #0

## 2022-07-05 NOTE — Plan of Care (Signed)

## 2022-07-05 NOTE — Progress Notes (Signed)
PHARMACIST LIPID MONITORING   Tommy Jimenez is a 56 y.o. male admitted on 07/04/2022 with peripheral vascular intervention. Pharmacy has been consulted to optimize lipid-lowering therapy with the indication of secondary prevention for clinical ASCVD.  Recent Labs:  Lipid Panel (last 6 months):   Lab Results  Component Value Date   CHOL 181 07/05/2022   TRIG 130 07/05/2022   HDL 40 (L) 07/05/2022   CHOLHDL 4.5 07/05/2022   VLDL 26 07/05/2022   LDLCALC 115 (H) 07/05/2022    Hepatic function panel (last 6 months):   Lab Results  Component Value Date   AST 16 04/24/2022   ALT 9 04/24/2022   ALKPHOS 100 04/24/2022   BILITOT 0.7 04/24/2022    SCr (since admission):   Serum creatinine: 0.98 mg/dL 07/05/22 0058 Estimated creatinine clearance: 79.7 mL/min  Current therapy and lipid therapy tolerance Current lipid-lowering therapy: rosuvastatin '40mg'$  Previous lipid-lowering therapies (if applicable): atorvastatin  Documented or reported allergies or intolerances to lipid-lowering therapies (if applicable): n/a  Assessment:   LDL still not within goal on high intensity rosuvastatin.   Plan:    1.Statin intensity (high intensity recommended for all patients regardless of the LDL):  No statin changes. The patient is already on a high intensity statin.  2.Add ezetimibe (if any one of the following):   On a high intensity statin with LDL > 70.  3.Refer to lipid clinic:   Yes  4.Follow-up with:  Lipid clinic referral.  5.Follow-up labs after discharge:  Changes in lipid therapy were made. Check a lipid panel in 8-12 weeks then annually.     Erin Hearing PharmD., BCPS Clinical Pharmacist 07/05/2022 8:46 AM

## 2022-07-05 NOTE — Progress Notes (Addendum)
Pt was transferred to 4E14. He is alert and fully oriented x 4, hemodynamically stable, afebrile, no distress at arrival.   Right groin is negative for bleeding or hematoma. We detect good signals from PT and DP pulses bilaterally. Pt stated his left and right feet are decreased sensation with tingling and numbness per baseline. Capillary refill less than 3 sec on both feet. Pain is well tolerated. No acute distress. We will continue to monitor.  Kennyth Lose, RN

## 2022-07-05 NOTE — Discharge Summary (Signed)
Discharge Summary    Patient ID: Tommy Jimenez MRN: [id number]; DOB: 03/04/1966  Admit date: 07/04/2022 Discharge date: 07/05/2022  PCP:  [name], PA-C   [place] Providers Cardiologist:  Sanda Klein, MD     Discharge Diagnoses    Principal Problem:   Claudication in peripheral vascular disease [facility])    Diagnostic Studies/Procedures    PV Angiogram/Intervention 07/04/22  Procedures Performed:             1.  Ultrasound-guided right common femoral access             2.  Abdominal aortogram/bilateral iliac angiogram/bifemoral runoff             3.  Contralateral access (second-order catheter placement)             4.  Shock balloon angioplasty followed by [facility] left SFA             5.  Self-expanding stenting using absolute Pro proximal left external iliac artery Angiographic Data:    1: Abdominal aorta-moderate atherosclerosis 2: Left lower extremity-60% segmental proximal left external iliac artery stenosis with a 70 mmHg pullback gradient.  There was a long 70% segmental proximal/mid/distal left SFA with a 90% fairly focal mid stenosis.  There is one-vessel runoff via the anterior tibial 3: Diffuse 50 to 70% right SFA stenosis with 0 vessel runoff.   IMPRESSION: Tommy Jimenez has bilateral SFA disease as well as tibial disease.  He also has a left external iliac artery stenosis.  We will proceed with left SFA and iliac intervention.  Final Impression: Successful left SFA chocolate balloon angioplasty followed by [facility] along with nitinol self-expanding stenting of a physiologically significant proximal left extrailiac artery stenosis.  Patient received 300 mg of clopidogrel at the end of the case.  He will be treated with triple therapy for 30 days after which aspirin will be discontinued.  He will be hydrated overnight.  The sheath will be removed once ACT falls below 170 and pressure held.  Eliquis can be restarted tomorrow.  He can be discharged  home tomorrow.  We will obtain lower extremity arterial Doppler studies in unrefined office next week and I will see him back in the office the week after.  He left the lab in stable condition.    _____________   History of Present Illness     Tommy Jimenez is a 56 y.o. male with a history of ongoing tobacco use, hypertension, diabetes, hyperlipidemia.  He also has a family history of CAD in his father and his sister.  Patient had a possible cardiac arrest anterior STEMI in 05/2017 and underwent cardiac catheterization at [facility] revealing an occluded ostial LAD.  He underwent PCI with drug-eluting stenting.  He has a known history of ischemic cardiomyopathy with a EF in the 30-35% range on guideline directed optical medical therapy.  He is followed by the heart failure clinic.  Patient has a history of DVT, PFO.  Patient recently complained of bilateral calf claudication which had been symmetric and lifestyle limiting for the past year.  Patient had Doppler studies performed on 03/14/2022 revealing a right ABI of 0.62 and a left of 0.69.  Also showed moderate distal SFA disease bilaterally along with tibial disease typical of a diabetic.    Patient was referred to Dr. Gwenlyn Found for management of PAD.  Dr.Berry initially tried to treat patient with cilostazol which did not provide him relief.  Patient was  seen in the office on 10/6. At that appointment, patient continued to complain of pain in his feet and calf claudication.  Hospital Course     Consultants: None   Claudication in Peripheral Vascular Disease  - Doppler studies in 02/2022 showed right ABI of 0.62 and a left of 0.69.  Also showed moderate distal SFA disease bilaterally along with tibial disease typical of a diabetic. - Patient followed by Dr. Berry. Last seen in the office on 10/6 at which time he continued to complain of bilateral foot pain and calf claudication, even after addition of cilostazol  - Planned  outpatient PV angiogram/intervention-- completed on 10/16. Showed moderate atherosclerosis in the abdominal aorta, 60% segmental proximal left external iliac artery stenosis, a long 70% segmental proximal/mid/distal left SFA with a 90% fairly focal mid stenosis, and diffuse 50-70% right SFA stenosis.  - Underwent successful left SFA chocolate balloon angioplasty followed by DCB and nitinol self-expanding stenting of the left extrailiac artery stenosis  - Patient was loaded with 300 mg plavix at the end of the case  - He will be discharged on triple therapy with eliquis, asa, plavix for 30 days. After 30 days, asa will be discontinued. H/H stable prior to discharge. Encouraged patient to monitor for signs of bleeding (dark/bloody stools, blood in urine, bleeding from gums) and to call office as needed  - Stopped cilostazol  - Patient scheduled for lower extremity arterial doppler studies on 10/27 - Patient has a follow up appointment with Hao Meng PA-C on 11/7 - Right femoral cath site was soft, nontender prior to discharge. No bleeding or bruising noted. Patient denied any pain in groin  HLD  - Lipid panel on 10/17 showed LDL 115, HDL 40, triglycerides 130 - Continue crestor 40  - Started xetia 10 mg daily  - Patient has been referred to lipid clinic  Tobacco Use  - Patient has been educated on tobacco cessation   Did the patient have an acute coronary syndrome (MI, NSTEMI, STEMI, etc) this admission?:  No                               Did the patient have a percutaneous coronary intervention (stent / angioplasty)?:  No.    Patient was seen and examined by Dr. [place] and deemed stable for discharge    _____________  Discharge Vitals Blood pressure 135/69, pulse 76, temperature 97.7 F (36.5 C), temperature source Oral, resp. rate 12, height 5' 8" (1.727 m), weight 66.2 kg, SpO2 100 %.  Filed Weights   07/04/22 1300  Weight: 66.2 kg    Labs & Radiologic Studies    CBC Recent  Labs    07/05/22 0058 07/05/22 1220  WBC 7.8  --   HGB 10.6* 11.5*  HCT 32.1* 34.3*  MCV 84.5  --   PLT 280  --    Basic Metabolic Panel Recent Labs    07/05/22 0058  NA 135  K 3.2*  CL 105  CO2 26  GLUCOSE 351*  BUN 9  CREATININE 0.98  CALCIUM 8.0*   Liver Function Tests No results for input(s): "AST", "ALT", "ALKPHOS", "BILITOT", "PROT", "ALBUMIN" in the last 72 hours. No results for input(s): "LIPASE", "AMYLASE" in the last 72 hours. High Sensitivity Troponin:   No results for input(s): "TROPONINIHS" in the last 720 hours.  BNP Invalid input(s): "POCBNP" D-Dimer No results for input(s): "DDIMER" in the last 72 hours. Hemoglobin A1C   No results for input(s): "HGBA1C" in the last 72 hours. Fasting Lipid Panel Recent Labs    07/05/22 0058  CHOL 181  HDL 40*  LDLCALC 115*  TRIG 130  CHOLHDL 4.5   Thyroid Function Tests No results for input(s): "TSH", "T4TOTAL", "T3FREE", "THYROIDAB" in the last 72 hours.  Invalid input(s): "FREET3" _____________  PERIPHERAL VASCULAR CATHETERIZATION  Result Date: 07/04/2022 Images from the original result were not included.  [id number] LOCATION:  FACILITY: [facility] PHYSICIAN: Jonathan Berry, M.D. 06/29/1966 DATE OF PROCEDURE:  07/04/2022 DATE OF DISCHARGE: PV Angiogram/Intervention History obtained from chart review.Tommy Jimenez is a 56 y.o.  thin-appearing divorced African-American male father of 2, grandfather of 7 grandchildren who works as a dishwasher) works.  He was referred by Jesse Cleaver, FNP for peripheral vascular evaluation because of claudication.  I last saw him in the office 03/25/2022.  His risk factors include ongoing tobacco abuse, treated hypertension, diabetes and hyperlipidemia.  His father and sister both had CAD.  He had an a possible cardiac arrest anterior STEMI 05/2017 and underwent cath at [facility] revealing occluded ostial LAD.  He had PCI drug-eluting stenting.  Does have ischemic  cardiomyopathy with an EF in the 30 to 35% range on guideline directed optimal medical therapy.  He is followed by the heart failure clinic.  He has had a DVT in the past as well as a PFO.  He complains of bilateral calf claudication which is symmetric, lifestyle-limiting for the last year.  He had Doppler studies performed 03/14/2022 revealing a right ABI of 0.62 and left of 0.69.  He had moderate distal SFA disease bilaterally along with tibial vessel disease typical of a diabetic.  He also relates symptoms compatible with diabetic peripheral neuropathy.  Since I saw him 3 months ago I did begin him on cilostazol which has afforded him no significant relief.  He still complains of pain in his feet as well as calf claudication.  His Dopplers did show moderate SFA disease bilaterally with typical tibial disease or diabetic.  He wishes to proceed with outpatient peripheral angiography and potential endovascular therapy. Pre Procedure Diagnosis: Peripheral arterial disease Post Procedure Diagnosis: Peripheral arterial disease Operators: Dr. Jonathan Berry Procedures Performed:  1.  Ultrasound-guided right common femoral access  2.  Abdominal aortogram/bilateral iliac angiogram/bifemoral runoff  3.  Contralateral access (second-order catheter placement)  4.  Shock balloon angioplasty followed by DCB left SFA             5.  Self-expanding stenting using absolute Pro proximal left external iliac artery PROCEDURE DESCRIPTION: The patient was brought to the second floor [facility] cath lab in the the postabsorptive state. He was premedicated with IV Versed and fentanyl. His right groin was prepped and shaved in usual sterile fashion. Xylocaine 1% was used for local anesthesia. A 5 French sheath was inserted into the right common femoral artery using standard Seldinger technique.  Ultrasound was used to identify the right common femoral artery and guide access.  A digital image was captured and placed the patient's  chart.  A 5 French pigtail catheter was placed in the distal abdominal aorta.  Distal abdominal aortography, bilateral iliac angiography with bifemoral runoff was performed using bolus chase, digital subtraction and step table technique.  Omnipaque dye was used for the entirety of the case (120 cc of contrast total to patient.)  Retrograde ordered pressures monitored during the case.   Angiographic Data: 1: Abdominal aorta-moderate atherosclerosis 2: Left lower   extremity-60% segmental proximal left external iliac artery stenosis with a 70 mmHg pullback gradient.  There was a long 70% segmental proximal/mid/distal left SFA with a 90% fairly focal mid stenosis.  There is one-vessel runoff via the anterior tibial 3: Diffuse 50 to 70% right SFA stenosis with 0 vessel runoff.   Tommy Jimenez has bilateral SFA disease as well as tibial disease.  He also has a left external iliac artery stenosis.  We will proceed with left SFA and iliac intervention. Procedure Description: Contralateral access was obtained with a 6 [place] 55 cm multipurpose Ansell sheath.  I crossed the diseased SFA segment with a 0.14/6 g Shepperd wire.  I then performed chocolate balloon angioplasty with a 3.5 mm x 120 mm long shock balloon.  There was a very focal lesion in the midportion of the diseased segment that yielded at 14 atm.  I then performed DCB with a 4 mm x 150 mm long impact Admiral drug-coated balloon at 4 atm for 3 minutes resulting in reduction of a long 70% stenosis with a 90% stenosis embedded in this to less than 20% residual.  There were some small focal linear dissections noted that did not appear to be flow-limiting. I then withdrew the Shepperd wire and placed a 035 versa core wire (300 cm) and performed direct stenting with a 7 mm x 40 mm long Abbott absolute Pro nitinol self-expanding stent.  I postdilated the disease segment with a 6 mm x 2 cm balloon resulting reduction of a 60% fairly focal proximal left extrailiac artery  stenosis to 0% residual.  I then withdrew the Ansell sheath across the iliac bifurcation and exchanged over the 035 versa core wire for a short 6 French sheath which was secured in place. Final Impression: Successful left SFA chocolate balloon angioplasty followed by [facility] along with nitinol self-expanding stenting of a physiologically significant proximal left extrailiac artery stenosis.  Patient received 300 mg of clopidogrel at the end of the case.  He will be treated with triple therapy for 30 days after which aspirin will be discontinued.  He will be hydrated overnight.  The sheath will be removed once ACT falls below 170 and pressure held.  Eliquis can be restarted tomorrow.  He can be discharged home tomorrow.  We will obtain lower extremity arterial Doppler studies in unrefined office next week and I will see him back in the office the week after.  He left the lab in stable condition. Quay Burow. MD, [facility] 07/04/2022 4:46 PM    Disposition   Pt is being discharged home today in good condition.  Follow-up Plans & Appointments     Follow-up Information     Almyra Deforest, [place] Follow up on 07/26/2022.   Specialties: Cardiology, Radiology Why: Appointment at 11/7 Contact information: [address] [place] [address] [phone]                Discharge Instructions     AMB Referral to Advanced Lipid Disorders Clinic   Complete by: As directed    Reason for referral: Patients refractory to standard guideline based therapy   Internal Lipid Clinic Referral Scheduling  Internal lipid clinic referrals are providers within [facility], who wish to refer established patients for routine management (help in starting PCSK9 inhibitor therapy) or advanced therapies.  Internal MD referral criteria:              1. All patients with LDL>190 mg/dL  2. All patients with Triglycerides >500 mg/dL  3. Patients with suspected or confirmed heterozygous familial hyperlipidemia (HeFH) or  homozygous familial hyperlipidemia (HoFH)  4. Patients with family history of suspicious for genetic dyslipidemia desiring genetic testing  5. Patients refractory to standard guideline based therapy  6. Patients with statin intolerance (failed 2 statins, one of which must be a high potency statin)  7. Patients who the provider desires to be seen by MD   Internal PharmD referral criteria:   1. Follow-up patients for medication management  2. Follow-up for compliance monitoring  3. Patients for drug education  4. Patients with statin intolerance  5. PCSK9 inhibitor education and prior authorization approvals  6. Patients with triglycerides <500 mg/dL  External Lipid Clinic Referral  External lipid clinic referrals are for providers outside of [facility], considered new clinic patients - automatically routed to MD schedule   Diet - low sodium heart healthy   Complete by: As directed    Discharge instructions   Complete by: As directed    Groin Site Care Refer to this sheet in the next few weeks. These instructions provide you with information on caring for yourself after your procedure. Your caregiver may also give you more specific instructions. Your treatment has been planned according to current medical practices, but problems sometimes occur. Call your caregiver if you have any problems or questions after your procedure. HOME CARE INSTRUCTIONS You may shower 24 hours after the procedure. Remove the bandage (dressing) and gently wash the site with plain soap and water. Gently pat the site dry.  Do not apply powder or lotion to the site.  Do not sit in a bathtub, swimming pool, or whirlpool for 5 to 7 days.  No bending, squatting, or lifting anything over 10 pounds (4.5 kg) as directed by your caregiver.  Inspect the site at least twice daily.  Do not drive home if you are discharged the same day of the procedure. Have someone else drive you.  You may drive 24 hours after the  procedure unless otherwise instructed by your caregiver.  What to expect: Any bruising will usually fade within 1 to 2 weeks.  Blood that collects in the tissue (hematoma) may be painful to the touch. It should usually decrease in size and tenderness within 1 to 2 weeks.  SEEK IMMEDIATE MEDICAL CARE IF: You have unusual pain at the groin site or down the affected leg.  You have redness, warmth, swelling, or pain at the groin site.  You have drainage (other than a small amount of blood on the dressing).  You have chills.  You have a fever or persistent symptoms for more than 72 hours.  You have a fever and your symptoms suddenly get worse.  Your leg becomes pale, cool, tingly, or numb.  You have heavy bleeding from the site. Hold pressure on the site. .   Increase activity slowly   Complete by: As directed         Discharge Medications   Allergies as of 07/05/2022   No Known Allergies      Medication List     STOP taking these medications    cilostazol 50 MG tablet Commonly known as: PLETAL       TAKE these medications    apixaban 5 MG Tabs tablet Commonly known as: ELIQUIS Take 1 tablet (5 mg total) by mouth 2 (two) times daily.   Aspirin Low Dose 81 MG tablet Generic drug: aspirin EC Take 1 tablet (81 mg total) by mouth daily. Swallow   whole.  Take for 30 days Start taking on: July 06, 2022   clopidogrel 75 MG tablet Commonly known as: PLAVIX Take 1 tablet (75 mg total) by mouth daily with breakfast. Start taking on: July 06, 2022   empagliflozin 10 MG Tabs tablet Commonly known as: Jardiance Take 1 tablet (10 mg total) by mouth daily before breakfast.   Entresto 49-51 MG Generic drug: sacubitril-valsartan Take 1 tablet by mouth 2 (two) times daily. What changed: when to take this   ezetimibe 10 MG tablet Commonly known as: ZETIA Take 1 tablet (10 mg total) by mouth daily. Start taking on: July 06, 2022   furosemide 20 MG tablet Commonly  known as: Lasix Take 1 tablet (20 mg total) by mouth as needed.   metoprolol succinate 25 MG 24 hr tablet Commonly known as: Toprol XL Take 1 tablet (25 mg total) by mouth at bedtime.   NovoLIN 70/30 Kwikpen (70-30) 100 UNIT/ML KwikPen Generic drug: insulin isophane & regular human KwikPen Inject 22 Units into the skin 2 (two) times daily before a meal. What changed: how much to take   Pen Needles 3/16" 31G X 5 MM Misc Use as directed with insulin pen   rosuvastatin 40 MG tablet Commonly known as: CRESTOR Take 1 tablet (40 mg total) by mouth daily.   spironolactone 25 MG tablet Commonly known as: ALDACTONE Take 0.5 tablets (12.5 mg total) by mouth daily.           Outstanding Labs/Studies     Duration of Discharge Encounter   Greater than 30 minutes including physician time.  Signed, Kathleen R. Johnson, PA-C 07/05/2022, 12:46 PM     

## 2022-07-05 NOTE — TOC Transition Note (Signed)
TOC medications (7 total) ready for pt discharge in the [facility] pharmacy on 2nd floor.

## 2022-07-12 ENCOUNTER — Other Ambulatory Visit (HOSPITAL_COMMUNITY): Payer: Self-pay | Admitting: Cardiovascular Disease

## 2022-07-12 DIAGNOSIS — I739 Peripheral vascular disease, unspecified: Secondary | ICD-10-CM

## 2022-07-12 DIAGNOSIS — Z95828 Presence of other vascular implants and grafts: Secondary | ICD-10-CM

## 2022-07-13 ENCOUNTER — Telehealth: Payer: Self-pay | Admitting: Cardiovascular Disease

## 2022-07-13 ENCOUNTER — Telehealth: Payer: Self-pay | Admitting: *Deleted

## 2022-07-13 NOTE — Patient Outreach (Signed)
  Care Coordination   Follow Up Visit Note   07/14/2022 Name: Tyvon Eggenberger MRN: [id number] DOB: 1965-11-27  Jenaro Souder is a 56 y.o. year old male who sees [name], [place] for primary care. I spoke with  [name] by phone today.  What matters to the patients health and wellness today?  Returned call to patient after a voice message was left by him  He voices concern with not being able to afford his anticoagulant He reports concerns that have been resolved per the patient with his car & insurance coverage. He shares information about a recent admission for stent placement        Goals Addressed               This Visit's Progress     Patient Stated     COMPLETED: assist to reach his listed cardiology office to get help with samples of his anticoagulant [facility]) (pt-stated)   Not on track     Called Dr Recardo Evangelist office, long wait time, entered for the telephone call system to return a call to Mr [place]' phone Mr Goerner updated that he will receive a call from Dr Isaac Laud office Sent a secure chat to Dr croituro & Dr Isaac Laud, cardiologist about the patient concern Closed as patient is no longer listed as a [facility] eligible client         SDOH assessments and interventions completed:  No     Care Coordination Interventions Activated:  Yes  Care Coordination Interventions:  Yes, provided   Follow up plan: No further intervention required.  He is listed with a primary of [facility] and is no longer a [facility] client  Encounter Outcome:  Pt. Visit Completed   Eda Magnussen L. Lavina Hamman, RN, BSN, [place] Coordinator Office number [phone]

## 2022-07-13 NOTE — Telephone Encounter (Signed)
Patient should be eligible for copay card. Has Pharmacist, community. Can activate online or pick one up in clinic

## 2022-07-13 NOTE — Telephone Encounter (Signed)
Pt made aware and will pick up co-pay card at front desk.

## 2022-07-13 NOTE — Telephone Encounter (Signed)
Patient calling the office for samples of medication:   1.  What medication and dosage are you requesting samples for? Eliquis  2.  Are you currently out of this medication?  If you do not have any  samples, do you have a discount card to help with Eliquis

## 2022-07-14 NOTE — Patient Instructions (Signed)
Visit Information  Thank you for taking time to visit with me today. Please don't hesitate to contact me if I can be of assistance to you.   Following are the goals we discussed today:   Goals Addressed               This Visit's Progress     Patient Stated     COMPLETED: assist to reach his listed cardiology office to get help with samples of his anticoagulant [facility]) (pt-stated)   Not on track     Called Dr Recardo Evangelist office, long wait time, entered for the telephone call system to return a call to Mr [place]' phone Mr Keady updated that he will receive a call from Dr Isaac Laud office Sent a secure chat to Dr croituro & Dr Isaac Laud, cardiologist about the patient concern Closed as patient is no longer listed as a [facility] eligible client         Our next appointment is by telephone N/A  on N/A at N/A  Please call the care guide team at [phone] if you need to cancel or reschedule your appointment.   If you are experiencing a Mental Health or [place] or need someone to talk to, please call the Suicide and Crisis Lifeline: 988 call the [place] National Suicide Prevention Lifeline: (432)196-3280 or TTY: 573-377-2699 TTY 216-481-0816) to talk to a trained counselor call 1-800-273-TALK (toll free, 24 hour hotline) go to [facility] [address] [place] [phone]) call 911   Patient verbalizes understanding of instructions and care plan provided today and agrees to view in [place]. Active MyChart status and patient understanding of how to access instructions and care plan via MyChart confirmed with patient.     The patient has been provided with contact information for the care management team and has been advised to call with any health related questions or concerns.   Jannine Abreu L. Lavina Hamman, RN, BSN, [place] Coordinator Office number [phone]

## 2022-07-15 ENCOUNTER — Ambulatory Visit (HOSPITAL_COMMUNITY)
Admission: RE | Admit: 2022-07-15 | Discharge: 2022-07-15 | Disposition: A | Discharge status: Home / Self Care | Payer: Self-pay | Source: Ambulatory Visit | Attending: Cardiology | Admitting: Cardiology

## 2022-07-15 ENCOUNTER — Other Ambulatory Visit (HOSPITAL_COMMUNITY): Payer: Self-pay | Admitting: Cardiovascular Disease

## 2022-07-15 ENCOUNTER — Ambulatory Visit (HOSPITAL_COMMUNITY): Admission: RE | Admit: 2022-07-15 | Payer: Self-pay | Source: Ambulatory Visit

## 2022-07-15 DIAGNOSIS — I739 Peripheral vascular disease, unspecified: Secondary | ICD-10-CM | POA: Insufficient documentation

## 2022-07-15 DIAGNOSIS — Z9862 Peripheral vascular angioplasty status: Secondary | ICD-10-CM

## 2022-07-20 ENCOUNTER — Ambulatory Visit (HOSPITAL_COMMUNITY)
Admission: RE | Admit: 2022-07-20 | Discharge: 2022-07-20 | Disposition: A | Discharge status: Home / Self Care | Payer: Self-pay | Source: Ambulatory Visit | Attending: Cardiovascular Disease | Admitting: Cardiovascular Disease

## 2022-07-20 DIAGNOSIS — I739 Peripheral vascular disease, unspecified: Secondary | ICD-10-CM | POA: Insufficient documentation

## 2022-07-20 DIAGNOSIS — Z95828 Presence of other vascular implants and grafts: Secondary | ICD-10-CM | POA: Insufficient documentation

## 2022-07-25 ENCOUNTER — Telehealth (HOSPITAL_COMMUNITY): Payer: Self-pay

## 2022-07-25 ENCOUNTER — Other Ambulatory Visit (HOSPITAL_COMMUNITY): Payer: Self-pay

## 2022-07-25 NOTE — Telephone Encounter (Signed)
Advanced Heart Failure Patient Advocate Encounter    Prior authorization is required for Eliquis '5MG'$ . PA submitted and APPROVED on 07/25/2022. Key [id number] Effective: 07/25/2022 - 07/25/2023  Test billing returns $52.65 copay for 30 day supply. Provide patient with copay assistance card if needed; this should bring patient cost to $10 per month.  Clista Bernhardt, CPhT Rx Patient Advocate Phone: [phone]

## 2022-07-26 ENCOUNTER — Ambulatory Visit: Payer: 59 | Attending: Physician Assistant | Admitting: Physician Assistant

## 2022-07-27 NOTE — Progress Notes (Signed)
This encounter was created in error - please disregard.

## 2022-08-01 ENCOUNTER — Encounter: Payer: Self-pay | Admitting: Physician Assistant

## 2022-08-04 ENCOUNTER — Ambulatory Visit: Payer: 59

## 2022-09-14 ENCOUNTER — Ambulatory Visit: Payer: 59 | Attending: Cardiovascular Disease

## 2022-09-14 NOTE — Progress Notes (Deleted)
Patient ID: Abisai Deer                 DOB: 09/20/1965                    MRN: [id number]     HPI: Tommy Jimenez is a 56 y.o. male patient referred to lipid clinic by Dr Gwenlyn Found. PMH is significant for   Current Medications:  Zetia '10mg'$  daily Rosuvastatin '40mg'$  daily  Intolerances:  Risk Factors:  LDL goal:   Diet:   Exercise:   Family History:   Social History:   Labs: TC 181, Trigs 130 HDL 40, LDL 115   Past Medical History:  Diagnosis Date   Coronary artery disease    stent placed   Dilated cardiomyopathy ([place])    DM (diabetes mellitus) ([place]) 1996   DVT (deep venous thrombosis) ([facility])    right leg    Erectile dysfunction    H/O acute myocardial infarction    H/O heart artery stent    History of DVT (deep vein thrombosis)    HTN (hypertension)    Ischemic cardiomyopathy    Myocardial infarction ([place])    Obesity    Peripheral vascular disease ([place])    Pure hypercholesterolemia     Current Outpatient Medications on File Prior to Visit  Medication Sig Dispense Refill   apixaban (ELIQUIS) 5 MG TABS tablet Take 1 tablet (5 mg total) by mouth 2 (two) times daily. 60 tablet 2   aspirin EC 81 MG tablet Take 1 tablet (81 mg total) by mouth daily. Swallow whole.  Take for 30 days 30 tablet 12   clopidogrel (PLAVIX) 75 MG tablet Take 1 tablet (75 mg total) by mouth daily with breakfast. 90 tablet 3   empagliflozin (JARDIANCE) 10 MG TABS tablet Take 1 tablet (10 mg total) by mouth daily before breakfast. 90 tablet 2   ezetimibe (ZETIA) 10 MG tablet Take 1 tablet (10 mg total) by mouth daily. 90 tablet 3   furosemide (LASIX) 20 MG tablet Take 1 tablet (20 mg total) by mouth as needed. 30 tablet 5   insulin isophane & regular human KwikPen (NOVOLIN 70/30 KWIKPEN) (70-30) 100 UNIT/ML KwikPen Inject 22 Units into the skin 2 (two) times daily before a meal. (Patient taking differently: Inject 24 Units into the skin 2 (two) times daily before a meal.) 15 mL 2   Insulin Pen  Needle (PEN NEEDLES 3/16") 31G X 5 MM MISC Use as directed with insulin pen 100 each 2   metoprolol succinate (TOPROL XL) 25 MG 24 hr tablet Take 1 tablet (25 mg total) by mouth at bedtime. 90 tablet 2   rosuvastatin (CRESTOR) 40 MG tablet Take 1 tablet (40 mg total) by mouth daily. 90 tablet 2   sacubitril-valsartan (ENTRESTO) 49-51 MG Take 1 tablet by mouth 2 (two) times daily. (Patient taking differently: Take 1 tablet by mouth daily.) 60 tablet 11   spironolactone (ALDACTONE) 25 MG tablet Take 0.5 tablets (12.5 mg total) by mouth daily. 45 tablet 3   No current facility-administered medications on file prior to visit.    No Known Allergies  Assessment/Plan:  1. Hyperlipidemia -

## 2022-11-12 IMAGING — CT CT HEAD W/O CM
3 series · 15 of 47 positions shown, 18 images · non-contrast
Comparison: MRI head 08/02/2013

CLINICAL DATA: TIA, numbness



[Series 2: head wo · axial · 0.45mm/px · z∈[-146,-16]mm · 9 of 32 slices shown, 12 images]
[im 3/32  brain]
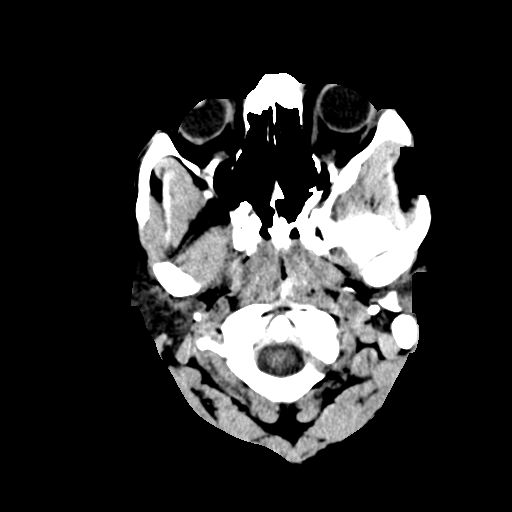
[im 3/32  bone]
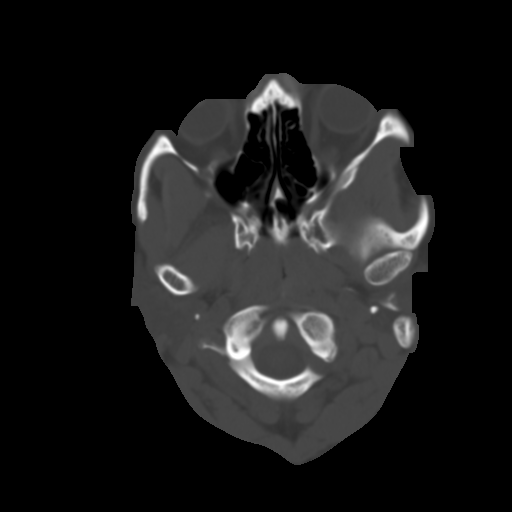
[im 6/32  brain]
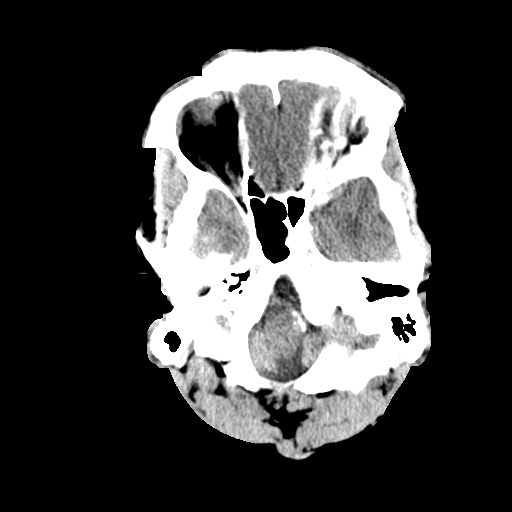
[im 9/32  brain]
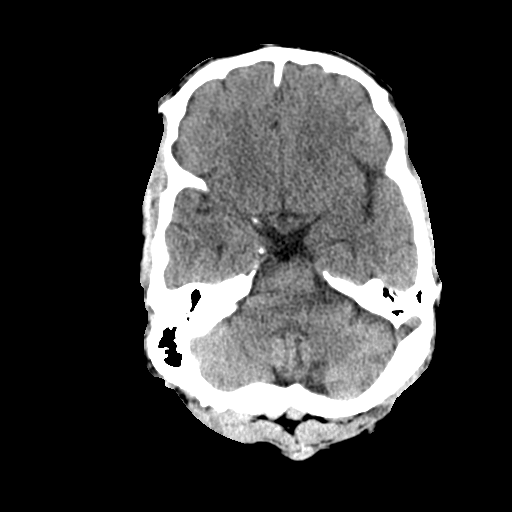
[im 12/32  brain]
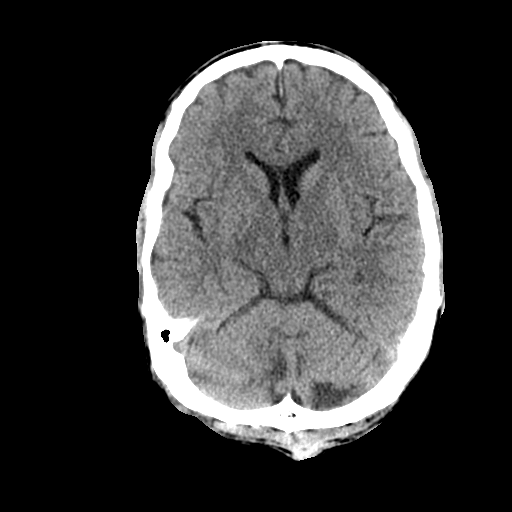
[im 17/32  brain]
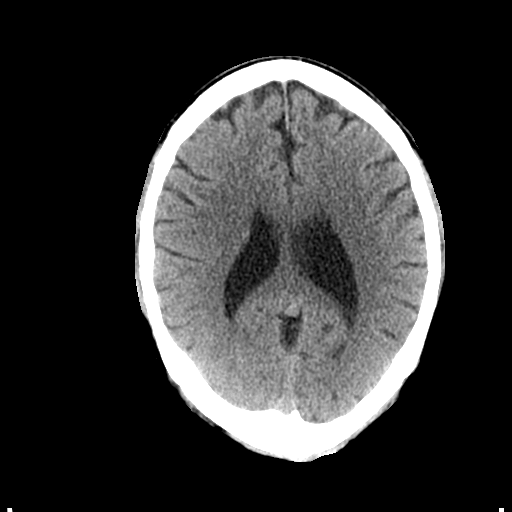
[im 17/32  bone]
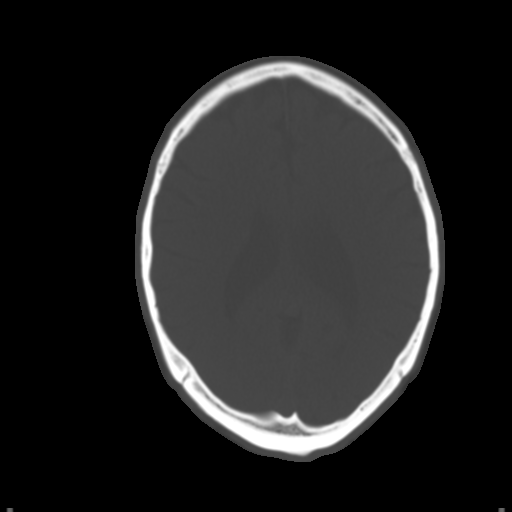
[im 20/32  brain]
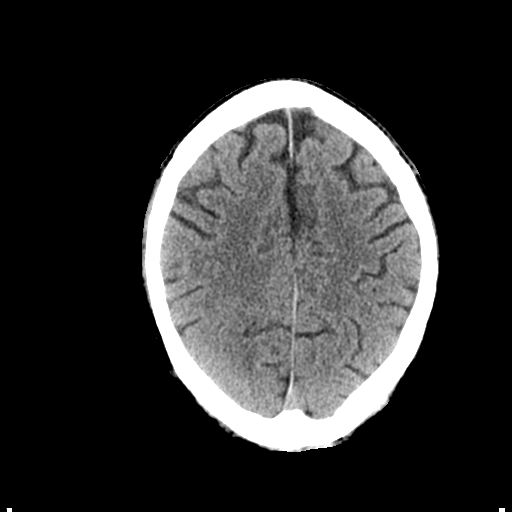
[im 23/32  brain]
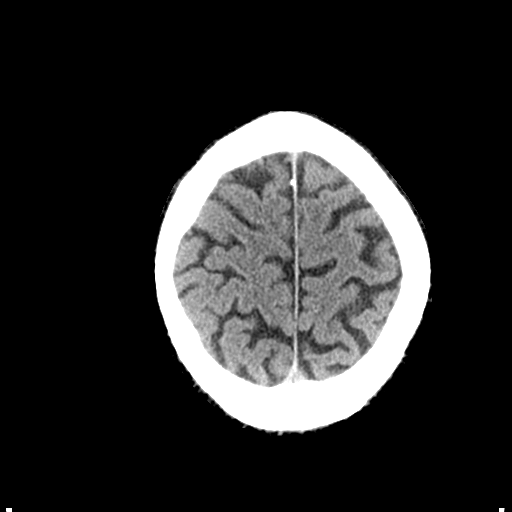
[im 26/32  brain]
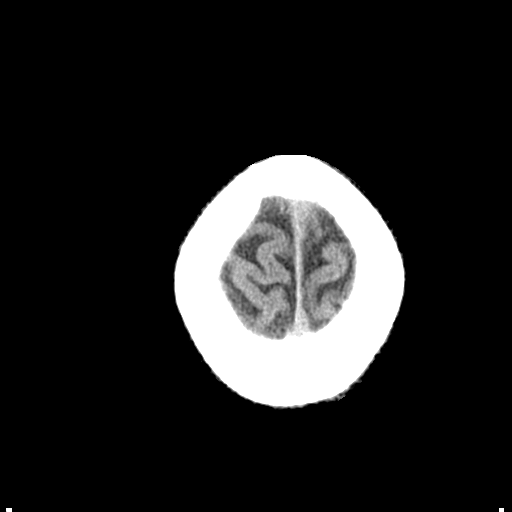
[im 29/32  brain]
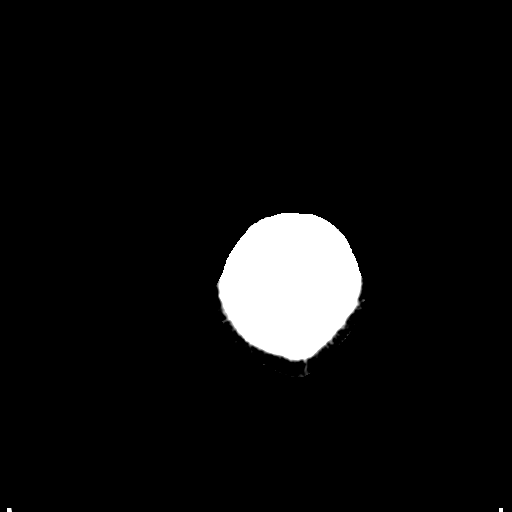
[im 29/32  bone]
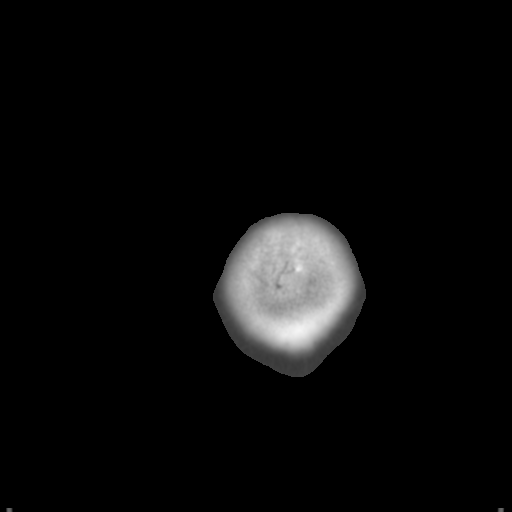

[Series 5: coronal soft tissue · coronal · 0.34mm/px · 3 of 77 slices shown]
[im 26/77  brain]
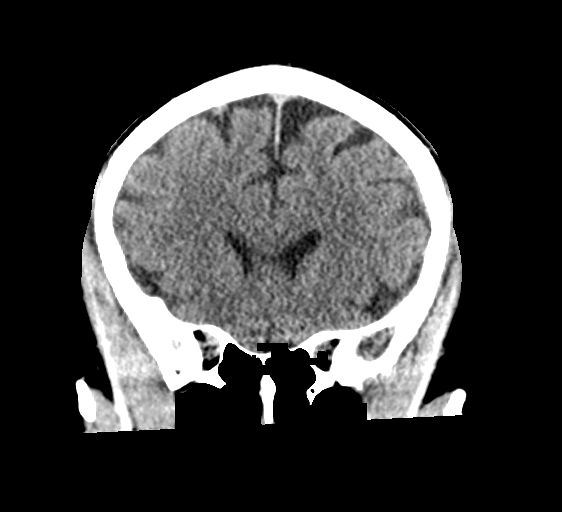
[im 34/77  brain]
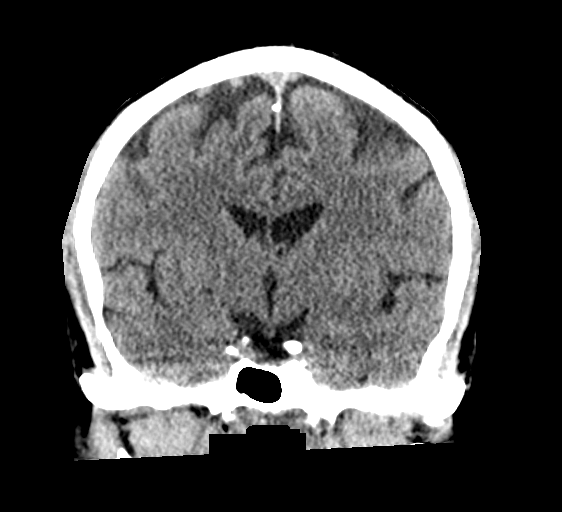
[im 43/77  brain]
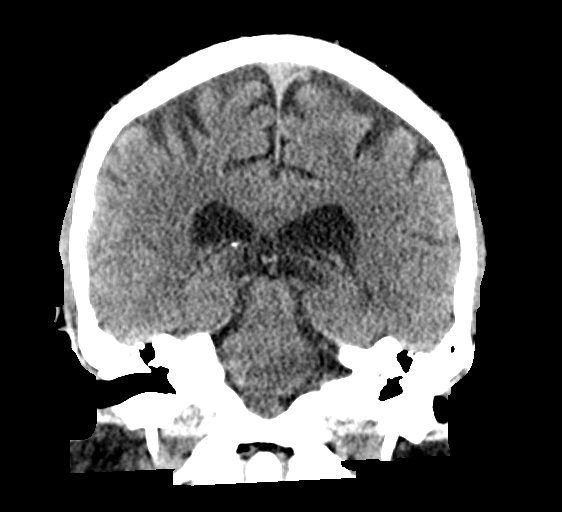

[Series 6: sagittal soft tissue · sagittal · 0.34mm/px · 3 of 60 slices shown]
[im 20/60  brain]
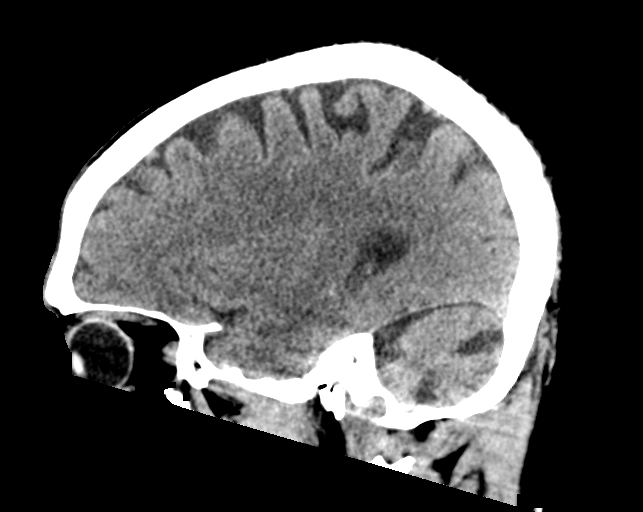
[im 30/60  brain]
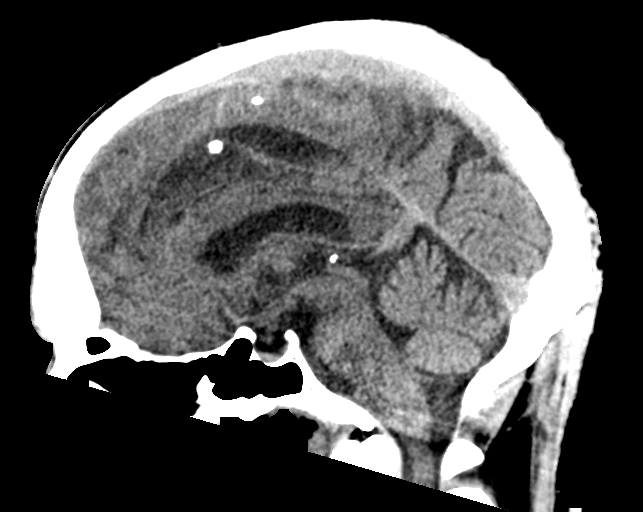
[im 40/60  brain]
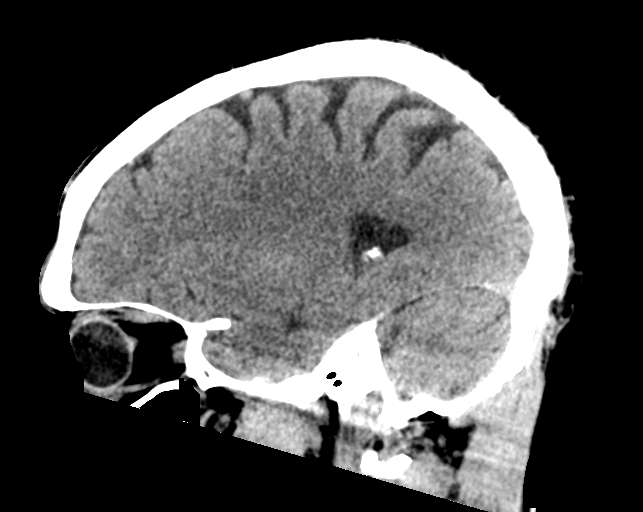

[15 of 47 positions shown; findings below may reference images not displayed]

FINDINGS: Brain: No acute intracranial hemorrhage, mass effect, or herniation.
No extra-axial fluid collections. No evidence of acute territorial
infarct. No hydrocephalus. Multiple foci of encephalomalacia in the
bilateral cerebellum left greater than right, consistent with old
infarcts.

Vascular: No hyperdense vessel or unexpected calcification.

Skull: Normal. Negative for fracture or focal lesion.

Sinuses/Orbits: No acute finding.

Other: None.
IMPRESSION: 1. No acute intracranial process identified.
2. Multiple old infarcts in the cerebellum.

## 2022-11-13 IMAGING — CT CT ANGIO HEAD-NECK (W OR W/O PERF)
2 of 11 series · 8 of 33 positions shown · IV contrast (APPLIED)
Comparison: Brain MRI, head and neck MRA yesterday. Head CT
yesterday.

CLINICAL DATA: 55-year-old male with TIA and small right anterior
frontal lobe cortical infarct on MRI yesterday, multiple chronic

Cerebellar infarcts greater on the left.
EXAM:
CT ANGIOGRAPHY HEAD AND NECK
TECHNIQUE: Multidetector CT imaging of the head and neck was performed using
the standard protocol during bolus administration of intravenous
contrast. Multiplanar CT image reconstructions and MIPs were
obtained to evaluate the vascular anatomy. Carotid stenosis
measurements (when applicable) are obtained utilizing NASCET
criteria, using the distal internal carotid diameter as the
denominator.

[Series 9: cta neck/head · axial · 0.59mm/px · z∈[-296,+46]mm · 3 of 172 slices shown]
[im 1/172  soft-tissue]
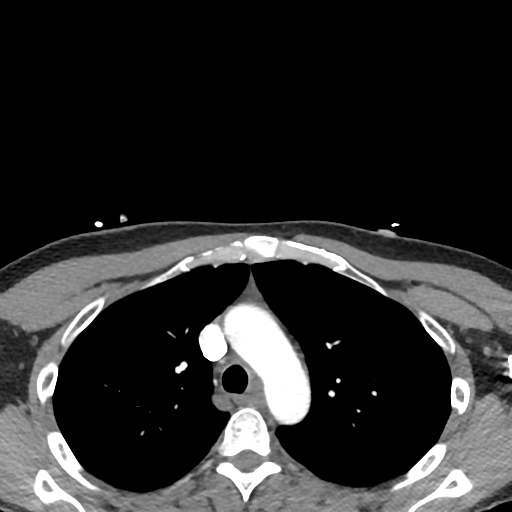
[im 86/172  bone]
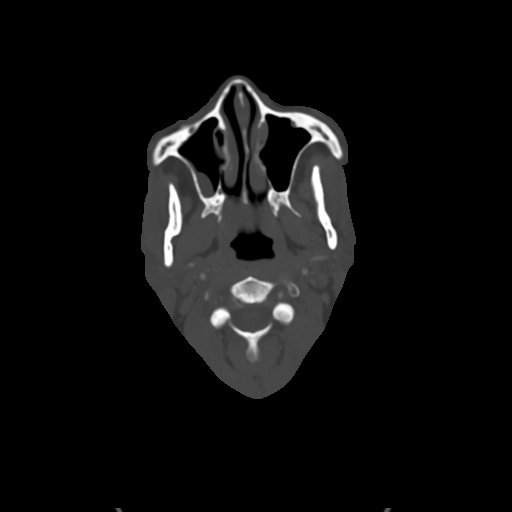
[im 172/172  soft-tissue]
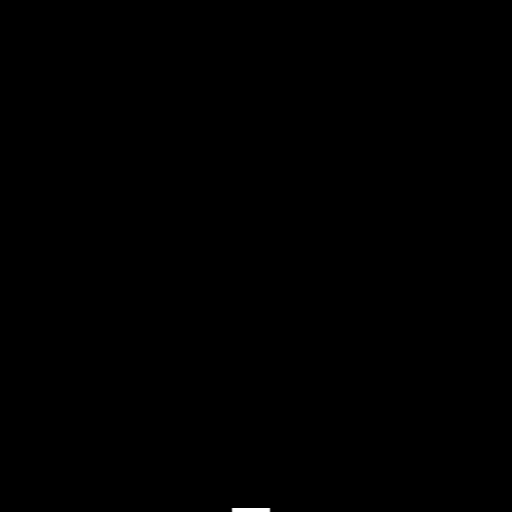

[Series 11: ax thins · axial · 0.39mm/px · z∈[-285,-81]mm · 5 of 343 slices shown]
[im 58/343  soft-tissue]
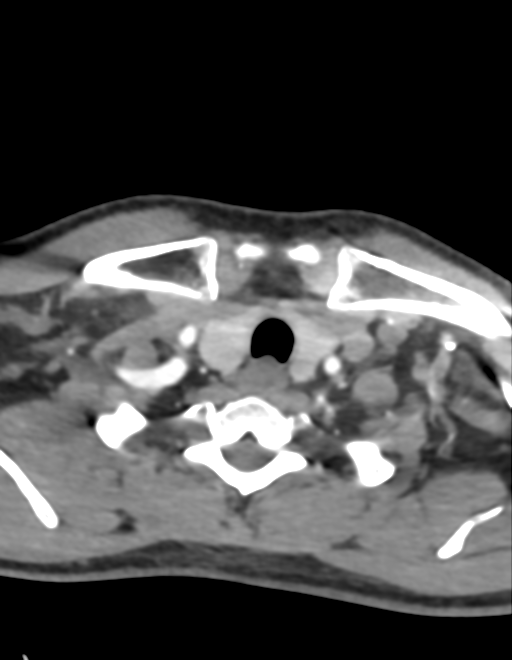
[im 115/343  soft-tissue]
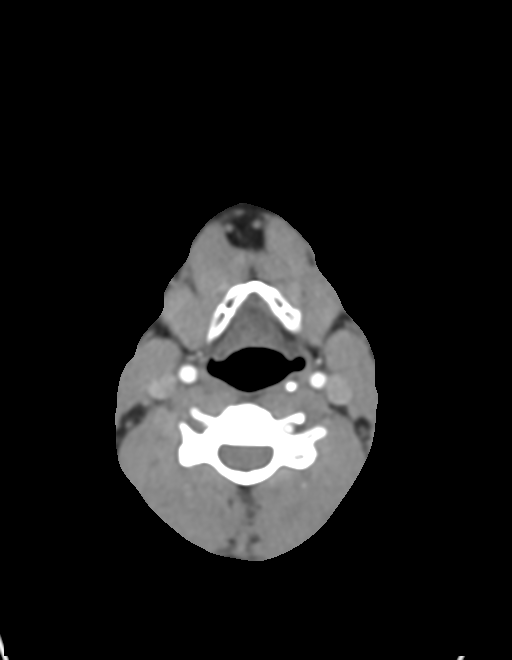
[im 172/343  soft-tissue]
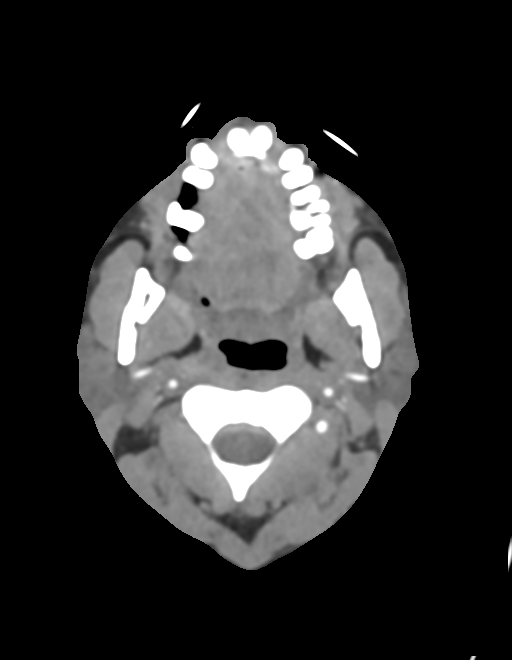
[im 229/343  soft-tissue]
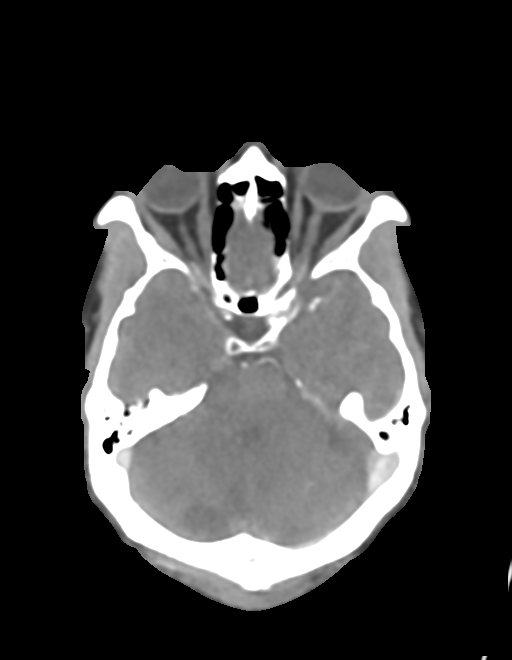
[im 286/343  soft-tissue]
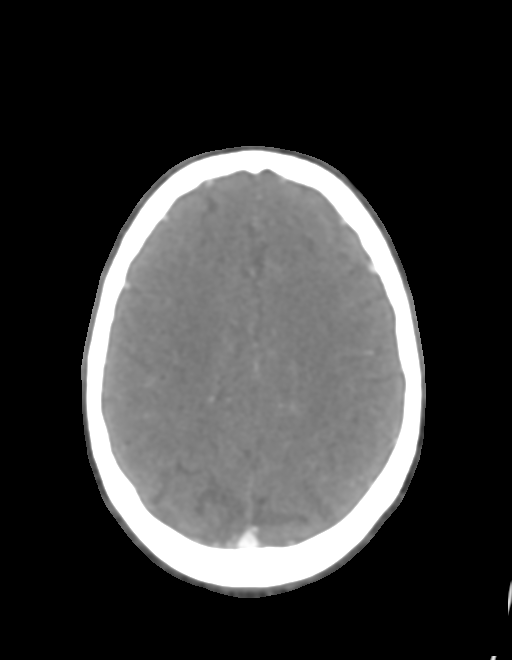

[8 of 33 positions shown; findings below may reference images not displayed]

RADIATION DOSE REDUCTION: This exam was performed according to the
departmental dose-optimization program which includes automated
exposure control, adjustment of the mA and/or kV according to
patient size and/or use of iterative reconstruction technique.

CONTRAST:  75mL OMNIPAQUE IOHEXOL 350 MG/ML SOLN
FINDINGS: CT HEAD

Brain: Small right anterior frontal lobe cortical infarct remains
occult by CT. No acute intracranial hemorrhage identified. No
midline shift, mass effect, or evidence of intracranial mass lesion.
No ventriculomegaly. Chronic left greater than right cerebellar
infarcts.

Calvarium and skull base: No acute osseous abnormality identified.

Paranasal sinuses: Visualized paranasal sinuses and mastoids are
stable and well aerated.

Orbits: Visualized orbits and scalp soft tissues are within normal
limits.

CTA NECK

Skeleton: Scattered dental caries and periapical dental lucency.
Mild for age cervical spine degeneration. No acute osseous
abnormality identified.

Upper chest: Negative.

Other neck: Negative.

Aortic arch: 3 vessel arch configuration. Mild arch atherosclerosis.

Right carotid system: Soft and calcified brachiocephalic artery
plaque without stenosis. Negative right CCA origin. Mild plaque at
the right carotid bifurcation. No stenosis.

Left carotid system: Mild plaque at the left ICA origin and bulb
without stenosis.

Vertebral arteries:
Normal proximal right subclavian artery. Highly diminutive right
vertebral artery throughout the neck, with the corresponding small
bony right cervical transverse foramen.

Mild proximal left subclavian artery plaque without stenosis. Normal
left vertebral artery origin. Dominant although diminutive and
intermittently irregular left vertebral artery is patent to the
skull base with no hemodynamically significant stenosis.

CTA HEAD

Posterior circulation: Dominant left vertebral artery supplies the
basilar with mild to moderate irregularity of the left V4 segment
(series 11, image 137). No significant stenosis. Non dominant highly
diminutive right vertebral artery functionally terminates at the
skull base or in PICA.

Patent although diminutive basilar artery with mild atherosclerotic
irregularity. No high-grade basilar stenosis. Patent PCA origins.
SCA is are diminutive. Bilateral PCAs are also diminutive but remain
patent. There is moderate long segment stenosis of the left PCA P1
and P2

Anterior circulation: Both ICA siphons are atherosclerotic and
irregular but remain patent. On the left there is mild to moderate
siphon stenosis. On the right there is moderate to severe siphon
stenosis in the cavernous segment (series 13, image 86) and in the
supraclinoid segment (image 89).

Both carotid termini are patent. Patent MCA and ACA origins.

Azygous appearing ACA anatomy. No definite ACA branch occlusion.
Both MCA M1 segments and bifurcations are patent without stenosis.
No MCA branch occlusion identified. Bilateral M3 branches are mildly
irregular.

Venous sinuses: Patent.

Anatomic variants: Dominant left and congenitally diminutive right
vertebral arteries. Azygous ACA A2 anatomy suspected.

Review of the MIP images confirms the above findings
IMPRESSION: 1. Negative for large vessel occlusion.

2. Relatively mild extracranial but moderate to severe Intracranial
Atherosclerosis:
- mild to moderate stenosis of the Distal Left Vertebral Artery
which supplies the Basilar (congenital highly diminutive right
vertebral.).
- up to moderate stenosis Left PCA P1/P2.
- up to moderate stenosis diminutive and irregular Left ICA siphon.
- tandem moderate to severe stenoses of the diminutive and irregular
Right ICA siphon.

3. Small right anterior frontal lobe cortical infarct remains occult
by CT. Chronic cerebellar infarcts. No new intracranial abnormality.

## 2023-01-16 ENCOUNTER — Encounter (HOSPITAL_COMMUNITY): Payer: Self-pay

## 2023-03-16 ENCOUNTER — Ambulatory Visit (HOSPITAL_COMMUNITY)
Admission: RE | Admit: 2023-03-16 | Payer: 59 | Source: Ambulatory Visit | Attending: Cardiovascular Disease | Admitting: Cardiovascular Disease

## 2023-03-27 ENCOUNTER — Encounter (HOSPITAL_COMMUNITY): Payer: Self-pay

## 2024-08-01 NOTE — Telephone Encounter (Signed)
 Try to give patient a call unable to get a hold form him, also unable to leave voicemail since his  mail box is not set out
